# Patient Record
Sex: Female | Born: 1980 | Race: White | Hispanic: Yes | Marital: Single | Smoking: Former smoker
Health system: Southern US, Community
[De-identification: ages and names within clinical notes are randomized; demographics above are authoritative.]

## PROBLEM LIST (undated history)

## (undated) DIAGNOSIS — S92909A Unspecified fracture of unspecified foot, initial encounter for closed fracture: Secondary | ICD-10-CM

## (undated) DIAGNOSIS — F419 Anxiety disorder, unspecified: Secondary | ICD-10-CM

## (undated) DIAGNOSIS — B029 Zoster without complications: Secondary | ICD-10-CM

## (undated) DIAGNOSIS — IMO0002 Reserved for concepts with insufficient information to code with codable children: Secondary | ICD-10-CM

## (undated) DIAGNOSIS — IMO0001 Reserved for inherently not codable concepts without codable children: Secondary | ICD-10-CM

## (undated) DIAGNOSIS — N946 Dysmenorrhea, unspecified: Secondary | ICD-10-CM

## (undated) DIAGNOSIS — F1910 Other psychoactive substance abuse, uncomplicated: Secondary | ICD-10-CM

## (undated) DIAGNOSIS — S62609A Fracture of unspecified phalanx of unspecified finger, initial encounter for closed fracture: Secondary | ICD-10-CM

## (undated) DIAGNOSIS — N83209 Unspecified ovarian cyst, unspecified side: Secondary | ICD-10-CM

## (undated) DIAGNOSIS — S322XXA Fracture of coccyx, initial encounter for closed fracture: Secondary | ICD-10-CM

## (undated) DIAGNOSIS — K219 Gastro-esophageal reflux disease without esophagitis: Secondary | ICD-10-CM

## (undated) HISTORY — DX: Unspecified ovarian cyst, unspecified side: N83.209

## (undated) HISTORY — DX: Fracture of coccyx, initial encounter for closed fracture: S32.2XXA

## (undated) HISTORY — DX: Zoster without complications: B02.9

## (undated) HISTORY — DX: Unspecified fracture of unspecified foot, initial encounter for closed fracture: S92.909A

## (undated) HISTORY — DX: Dysmenorrhea, unspecified: N94.6

## (undated) HISTORY — DX: Reserved for concepts with insufficient information to code with codable children: IMO0002

## (undated) HISTORY — DX: Fracture of unspecified phalanx of unspecified finger, initial encounter for closed fracture: S62.609A

## (undated) HISTORY — DX: Anxiety disorder, unspecified: F41.9

## (undated) HISTORY — DX: Other psychoactive substance abuse, uncomplicated: F19.10

---
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
## 2003-05-30 ENCOUNTER — Encounter: Payer: Self-pay | Admitting: Emergency Medicine

## 2003-05-30 ENCOUNTER — Emergency Department (HOSPITAL_COMMUNITY): Admission: EM | Admit: 2003-05-30 | Discharge: 2003-05-30 | Payer: Self-pay | Admitting: Emergency Medicine

## 2003-07-11 ENCOUNTER — Other Ambulatory Visit: Admission: RE | Admit: 2003-07-11 | Discharge: 2003-07-11 | Payer: Self-pay | Admitting: Obstetrics and Gynecology

## 2004-01-05 ENCOUNTER — Other Ambulatory Visit: Admission: RE | Admit: 2004-01-05 | Discharge: 2004-01-05 | Payer: Self-pay | Admitting: Family Medicine

## 2004-04-30 ENCOUNTER — Emergency Department (HOSPITAL_COMMUNITY): Admission: EM | Admit: 2004-04-30 | Discharge: 2004-04-30 | Payer: Self-pay | Admitting: Emergency Medicine

## 2004-05-07 ENCOUNTER — Emergency Department (HOSPITAL_COMMUNITY): Admission: EM | Admit: 2004-05-07 | Discharge: 2004-05-07 | Payer: Self-pay | Admitting: Emergency Medicine

## 2004-05-23 ENCOUNTER — Emergency Department (HOSPITAL_COMMUNITY): Admission: EM | Admit: 2004-05-23 | Discharge: 2004-05-24 | Payer: Self-pay | Admitting: Emergency Medicine

## 2004-10-07 ENCOUNTER — Emergency Department (HOSPITAL_COMMUNITY): Admission: EM | Admit: 2004-10-07 | Discharge: 2004-10-07 | Payer: Self-pay | Admitting: Emergency Medicine

## 2005-03-11 ENCOUNTER — Other Ambulatory Visit: Admission: RE | Admit: 2005-03-11 | Discharge: 2005-03-11 | Payer: Self-pay | Admitting: Family Medicine

## 2005-09-06 ENCOUNTER — Encounter: Admission: RE | Admit: 2005-09-06 | Discharge: 2005-09-06 | Payer: Self-pay | Admitting: Family Medicine

## 2006-08-18 ENCOUNTER — Other Ambulatory Visit: Admission: RE | Admit: 2006-08-18 | Discharge: 2006-08-18 | Payer: Self-pay | Admitting: Family Medicine

## 2006-09-09 ENCOUNTER — Emergency Department (HOSPITAL_COMMUNITY): Admission: EM | Admit: 2006-09-09 | Discharge: 2006-09-09 | Payer: Self-pay | Admitting: Emergency Medicine

## 2006-10-27 ENCOUNTER — Emergency Department (HOSPITAL_COMMUNITY): Admission: EM | Admit: 2006-10-27 | Discharge: 2006-10-27 | Payer: Self-pay | Admitting: Emergency Medicine

## 2006-12-15 ENCOUNTER — Emergency Department (HOSPITAL_COMMUNITY): Admission: EM | Admit: 2006-12-15 | Discharge: 2006-12-15 | Payer: Self-pay | Admitting: Emergency Medicine

## 2007-02-06 ENCOUNTER — Emergency Department (HOSPITAL_COMMUNITY): Admission: EM | Admit: 2007-02-06 | Discharge: 2007-02-06 | Payer: Self-pay | Admitting: Emergency Medicine

## 2007-04-19 ENCOUNTER — Emergency Department (HOSPITAL_COMMUNITY): Admission: EM | Admit: 2007-04-19 | Discharge: 2007-04-19 | Payer: Self-pay | Admitting: Emergency Medicine

## 2007-06-29 ENCOUNTER — Emergency Department (HOSPITAL_COMMUNITY): Admission: EM | Admit: 2007-06-29 | Discharge: 2007-06-29 | Payer: Self-pay | Admitting: Emergency Medicine

## 2009-06-13 ENCOUNTER — Emergency Department (HOSPITAL_COMMUNITY): Admission: EM | Admit: 2009-06-13 | Discharge: 2009-06-13 | Payer: Self-pay | Admitting: Emergency Medicine

## 2009-07-08 ENCOUNTER — Emergency Department (HOSPITAL_COMMUNITY): Admission: EM | Admit: 2009-07-08 | Discharge: 2009-07-08 | Payer: Self-pay | Admitting: Emergency Medicine

## 2009-08-04 ENCOUNTER — Emergency Department (HOSPITAL_COMMUNITY): Admission: EM | Admit: 2009-08-04 | Discharge: 2009-08-04 | Payer: Self-pay | Admitting: Emergency Medicine

## 2009-10-02 ENCOUNTER — Emergency Department (HOSPITAL_COMMUNITY): Admission: EM | Admit: 2009-10-02 | Discharge: 2009-10-02 | Payer: Self-pay | Admitting: Emergency Medicine

## 2009-12-16 ENCOUNTER — Ambulatory Visit: Payer: Self-pay | Admitting: Diagnostic Radiology

## 2009-12-16 ENCOUNTER — Emergency Department (HOSPITAL_BASED_OUTPATIENT_CLINIC_OR_DEPARTMENT_OTHER): Admission: EM | Admit: 2009-12-16 | Discharge: 2009-12-16 | Payer: Self-pay | Admitting: Emergency Medicine

## 2009-12-29 ENCOUNTER — Emergency Department (HOSPITAL_BASED_OUTPATIENT_CLINIC_OR_DEPARTMENT_OTHER): Admission: EM | Admit: 2009-12-29 | Discharge: 2009-12-30 | Payer: Self-pay | Admitting: Emergency Medicine

## 2010-02-24 ENCOUNTER — Emergency Department (HOSPITAL_BASED_OUTPATIENT_CLINIC_OR_DEPARTMENT_OTHER): Admission: EM | Admit: 2010-02-24 | Discharge: 2010-02-25 | Payer: Self-pay | Admitting: Emergency Medicine

## 2010-02-25 ENCOUNTER — Ambulatory Visit: Payer: Self-pay | Admitting: Diagnostic Radiology

## 2010-03-27 ENCOUNTER — Ambulatory Visit (HOSPITAL_COMMUNITY): Admission: RE | Admit: 2010-03-27 | Discharge: 2010-03-27 | Payer: Self-pay | Admitting: Psychiatry

## 2010-04-02 ENCOUNTER — Ambulatory Visit: Payer: Self-pay | Admitting: Occupational Medicine

## 2010-04-02 DIAGNOSIS — S93409A Sprain of unspecified ligament of unspecified ankle, initial encounter: Secondary | ICD-10-CM | POA: Insufficient documentation

## 2010-04-24 ENCOUNTER — Emergency Department (HOSPITAL_COMMUNITY): Admission: EM | Admit: 2010-04-24 | Discharge: 2010-04-24 | Payer: Self-pay | Admitting: Emergency Medicine

## 2010-04-29 ENCOUNTER — Ambulatory Visit: Payer: Self-pay | Admitting: Diagnostic Radiology

## 2010-06-22 ENCOUNTER — Encounter: Admission: RE | Admit: 2010-06-22 | Discharge: 2010-06-22 | Payer: Self-pay | Admitting: Unknown Physician Specialty

## 2010-07-10 IMAGING — CR DG ANKLE COMPLETE 3+V*R*
3 series · 3 of 3 positions shown · non-contrast
Comparison: 02/25/2010.

CLINICAL DATA: Injury.

RIGHT ANKLE - COMPLETE 3+ VIEW

[view not recorded (1 of 3)]
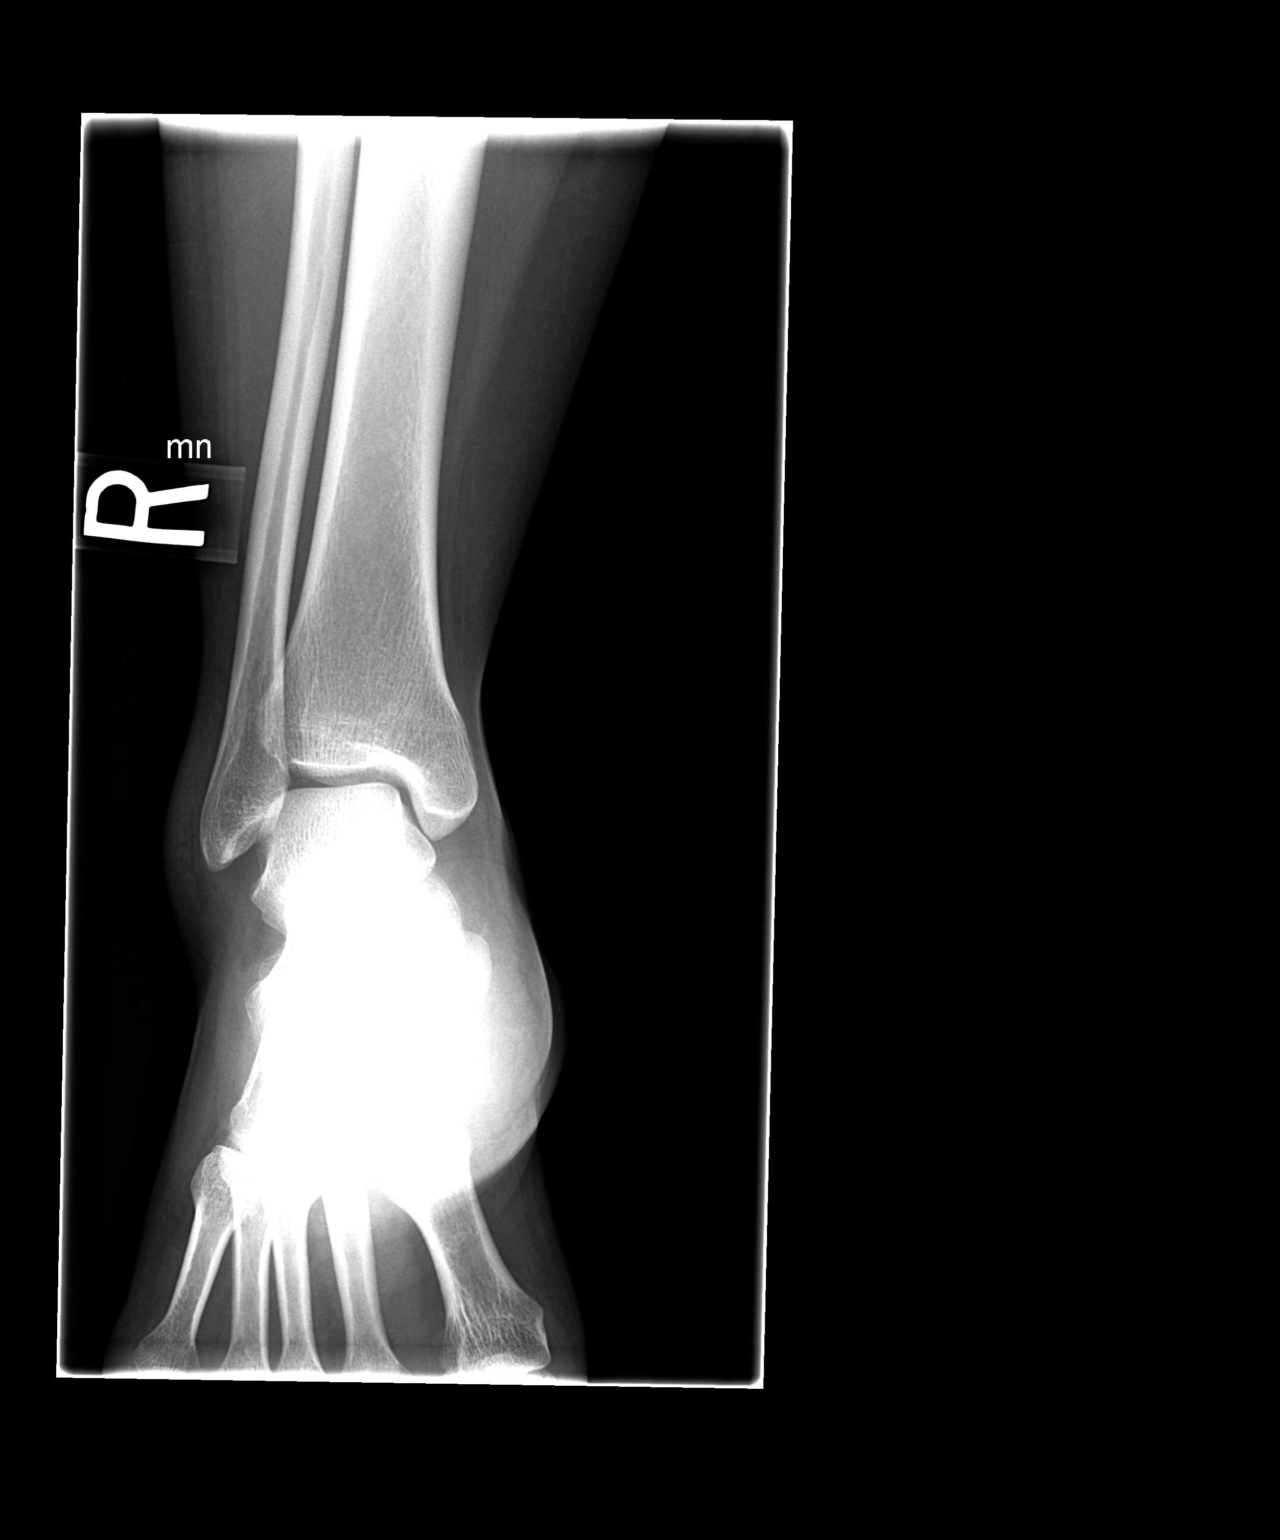

[view not recorded (2 of 3)]
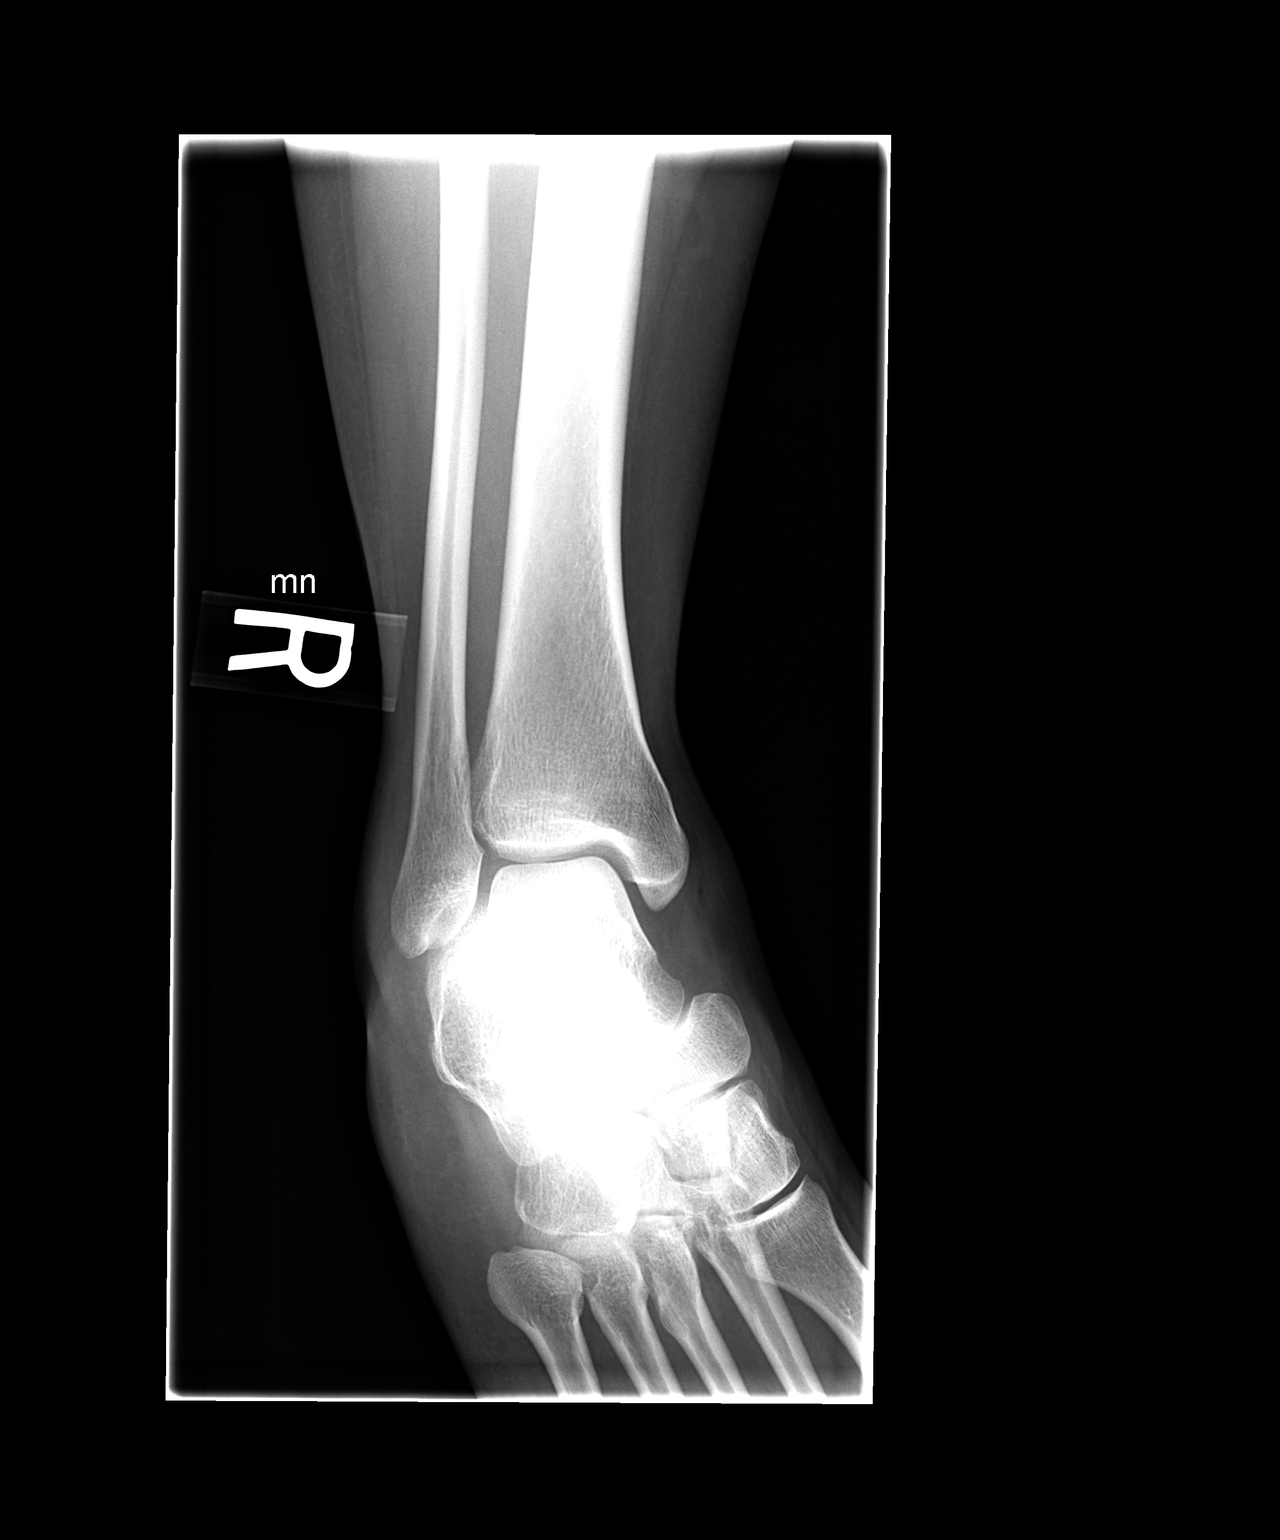

[view not recorded (3 of 3)]
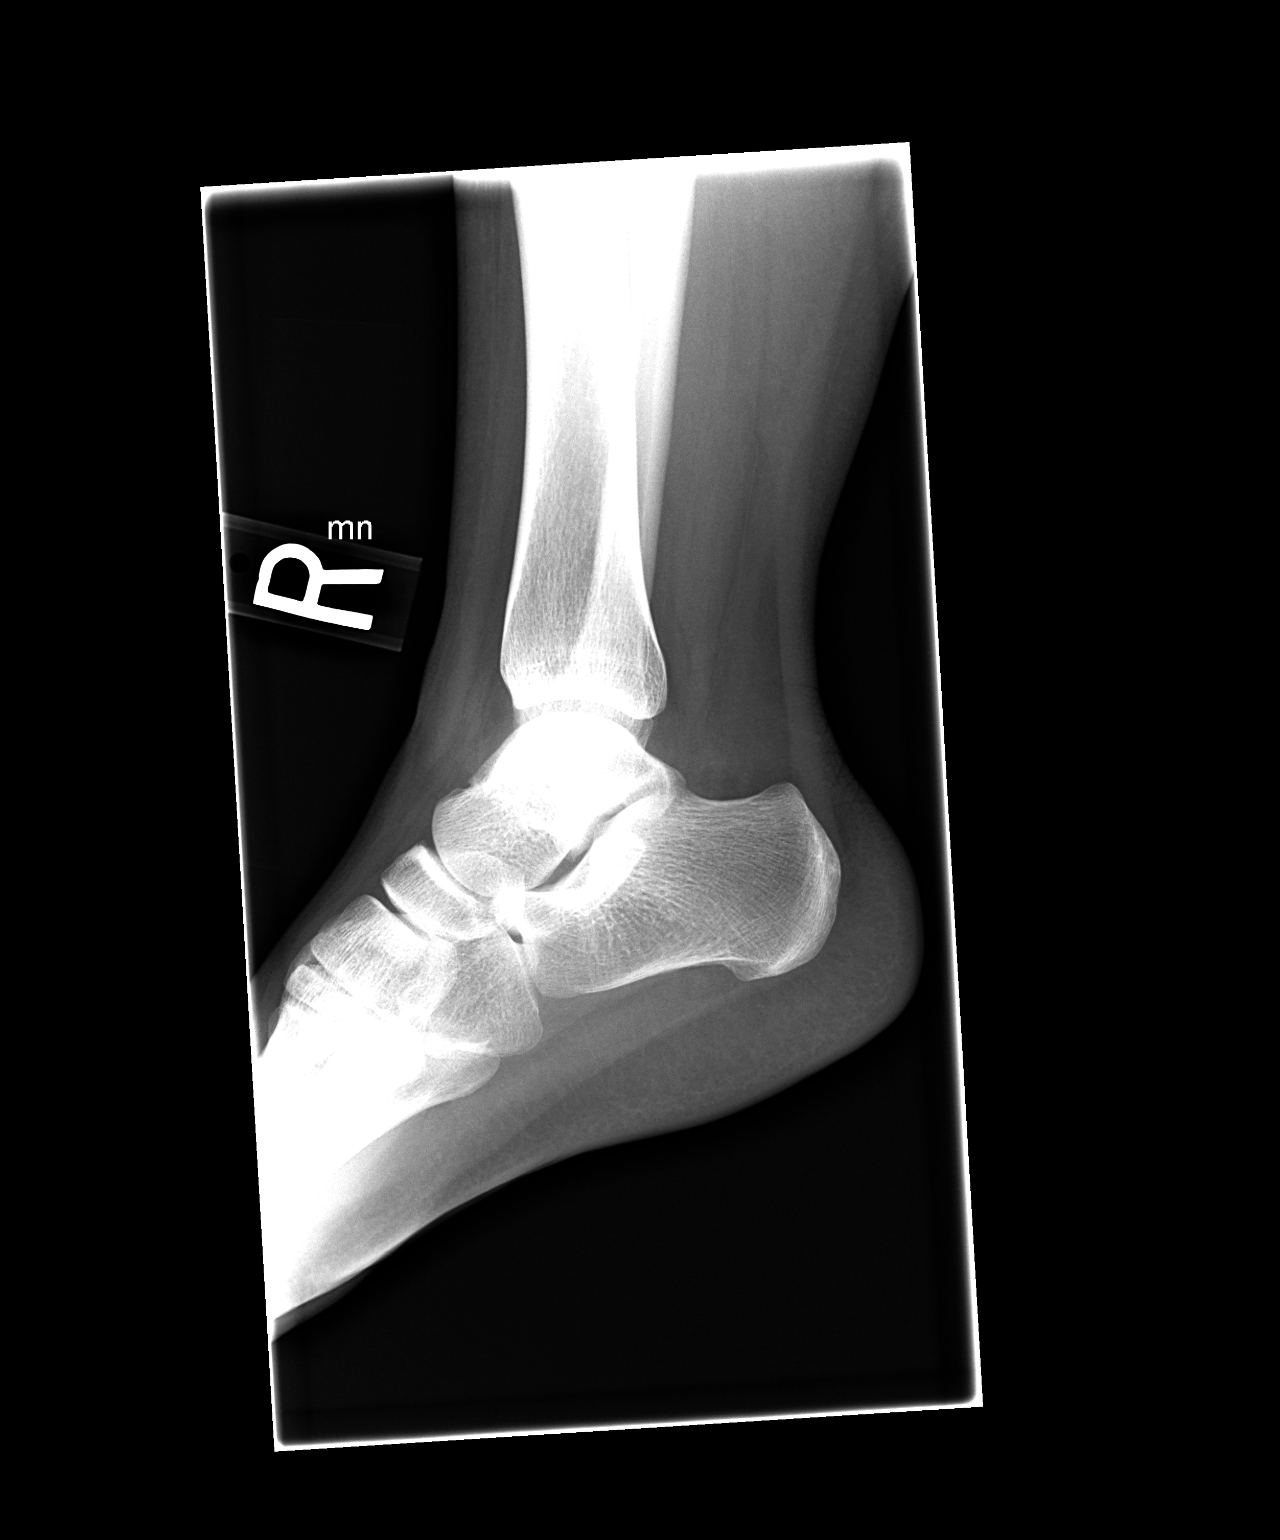

[3 of 3 positions shown; findings below may reference images not displayed]

FINDINGS: Soft tissue swelling mainly laterally.  No fracture or
subluxation.
IMPRESSION: Negative for right ankle fracture/subluxation.

## 2010-11-01 ENCOUNTER — Emergency Department (HOSPITAL_BASED_OUTPATIENT_CLINIC_OR_DEPARTMENT_OTHER): Admission: EM | Admit: 2010-11-01 | Discharge: 2010-04-29 | Payer: Self-pay | Admitting: Emergency Medicine

## 2010-11-11 ENCOUNTER — Ambulatory Visit: Payer: Self-pay | Admitting: Emergency Medicine

## 2010-11-11 DIAGNOSIS — M25539 Pain in unspecified wrist: Secondary | ICD-10-CM

## 2010-12-25 NOTE — Assessment & Plan Note (Signed)
Summary: INJURY TO ANKLE   Vital Signs:  Patient Profile:   30 Years Old Female CC:      Both ankles twisted while jogging x 5 days Height:     54 inches Weight:      130 pounds O2 Sat:      97 % O2 treatment:    Room Air Temp:     98.2 degrees F oral Pulse rate:   123 / minute Pulse rhythm:   irregular Resp:     18 per minute BP sitting:   113 / 73  (right arm) Cuff size:   regular  Pt. in pain?   yes    Location:   ankle    Intensity:   8    Type:       burning  Vitals Entered By: Emilio Math (Apr 02, 2010 4:04 PM)                 History of Present Illness Chief Complaint: Both ankles twisted while jogging x 5 days History of Present Illness: Very difficult historian.   Says she twisted both ankles while jogging some time in the past week.   She gave several dates as to the time of injury.  2 days ago, 1 week.   The patient smells of alcohol and has been unemployed for a year.   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  She did not drive herself to the clinic.  Her boyfriend drove.   She has been prescribed 70 hydrocodone in the last 4 months.   Last prescription for 30 pills was filled 01/25/2010  REVIEW OF SYSTEMS Constitutional Symptoms      Denies fever, chills, night sweats, weight loss, weight gain, and fatigue.  Eyes       Denies change in vision, eye pain, eye discharge, glasses, contact lenses, and eye surgery. Ear/Nose/Throat/Mouth       Denies hearing loss/aids, change in hearing, ear pain, ear discharge, dizziness, frequent runny nose, frequent nose bleeds, sinus problems, sore throat, hoarseness, and tooth pain or bleeding.  Respiratory       Denies dry cough, productive cough, wheezing, shortness of breath, asthma, bronchitis, and emphysema/COPD.  Cardiovascular       Denies murmurs, chest pain, and tires easily with exhertion.    Gastrointestinal       Denies stomach pain, nausea/vomiting, diarrhea, constipation,  blood in bowel movements, and indigestion. Genitourniary       Denies painful urination, kidney stones, and loss of urinary control. Neurological       Denies paralysis, seizures, and fainting/blackouts. Musculoskeletal       Denies muscle pain, joint pain, joint stiffness, decreased range of motion, redness, swelling, muscle weakness, and gout.  Skin       Denies bruising, unusual mles/lumps or sores, and hair/skin or nail changes.  Psych       Denies mood changes, temper/anger issues, anxiety/stress, speech problems, depression, and sleep problems.  Past History:  Past Medical History: Unremarkable  Past Surgical History: Denies surgical history  Family History: Mother, UNK Father, UNK  Social History: 2 cigs perday, 13 yrs ETOH-yes Drugs No Unemployed Physical Exam General appearance: smells of alcohol.   Fluid but tangental speech.  No slurring.  Head: normocephalic, atraumatic Eyes: conjunctivae and lids normal Ears: normal, no lesions or deformities Oral/Pharynx: tongue normal, posterior pharynx without erythema or exudate Extremities: Walks with a shuffling gait that improved as she  walks.   Minor swelling of the right lateral malloelus.  No bruising.   Patient expresses discomfort with light palpation of both lateral malloelus.  Assessment New Problems: ANKLE SPRAIN, RIGHT (ICD-845.00)   Plan New Medications/Changes: TRAMADOL HCL 50 MG TABS (TRAMADOL HCL) One or two tabs by mouth hs as needed for pain  #20 x 0, 04/02/2010, Kathrine Haddock MD  New Orders: T-DG Ankle Complete*L* [73610] T-DG Ankle Complete*R* [73610] New Patient Level III [99203] Planning Comments:     RIGHT ANKLE - COMPLETE 3+ VIEW   Comparison: 02/25/2010.   Findings: Soft tissue swelling mainly laterally.  No fracture or subluxation.   IMPRESSION: Negative for right ankle fracture/subluxation.  Ace wraps Tramadol for pain Follow up as needed   The patient and/or caregiver has  been counseled thoroughly with regard to medications prescribed including dosage, schedule, interactions, rationale for use, and possible side effects and they verbalize understanding.  Diagnoses and expected course of recovery discussed and will return if not improved as expected or if the condition worsens. Patient and/or caregiver verbalized understanding.  Prescriptions: TRAMADOL HCL 50 MG TABS (TRAMADOL HCL) One or two tabs by mouth hs as needed for pain  #20 x 0   Entered and Authorized by:   Kathrine Haddock MD   Signed by:   Kathrine Haddock MD on 04/02/2010   Method used:   Print then Give to Patient   RxID:   [id number]

## 2010-12-27 NOTE — Assessment & Plan Note (Signed)
Summary: DOG BITE/TM   Vital Signs:  Patient Profile:   30 Years Old Female CC:      Dog bite to rt hand Height:     54 inches Weight:      130 pounds O2 Sat:      97 % O2 treatment:    Room Air Temp:     99.0 degrees F oral Pulse rate:   118 / minute Pulse rhythm:   regular Resp:     18 per minute BP sitting:   107 / 71  (left arm) Cuff size:   regular  Vitals Entered By: Emilio Math (November 11, 2010 6:03 PM)                  Current Allergies: No known allergies History of Present Illness History from: patient & boyfriend Chief Complaint: Dog bite to rt hand History of Present Illness: She was watching a football game at a friend's house and playing with their 72 month old pit bull and states that she was nipped on the right hand and wrist multiple times.  They report that the dog was being playful and has done this many times in the past.  Mild scratches but it didn't break the skin.  Pain is throbbing and radiates to fingers and all the way up her arm.  She denies falling or other trauma.  Her last tetanus shot was 1 year ago.  The dog is up to date on his rabies shot.  **Patient smells of alcohol and is a difficult historian.  She was brought to the clinic by her boyfriend who is apparently sober.  REVIEW OF SYSTEMS Constitutional Symptoms      Denies fever, chills, night sweats, weight loss, weight gain, and fatigue.  Eyes       Denies change in vision, eye pain, eye discharge, glasses, contact lenses, and eye surgery. Ear/Nose/Throat/Mouth       Denies hearing loss/aids, change in hearing, ear pain, ear discharge, dizziness, frequent runny nose, frequent nose bleeds, sinus problems, sore throat, hoarseness, and tooth pain or bleeding.  Respiratory       Denies dry cough, productive cough, wheezing, shortness of breath, asthma, bronchitis, and emphysema/COPD.  Cardiovascular       Denies murmurs, chest pain, and tires easily with exhertion.    Gastrointestinal       Denies stomach pain, nausea/vomiting, diarrhea, constipation, blood in bowel movements, and indigestion. Genitourniary       Denies painful urination, kidney stones, and loss of urinary control. Neurological       Denies paralysis, seizures, and fainting/blackouts. Musculoskeletal       Denies muscle pain, joint pain, joint stiffness, decreased range of motion, redness, swelling, muscle weakness, and gout.  Skin       Complains of bruising.      Denies unusual mles/lumps or sores and hair/skin or nail changes.  Psych       Denies mood changes, temper/anger issues, anxiety/stress, speech problems, depression, and sleep problems.  Past History:  Past Medical History: Reviewed history from 04/02/2010 and no changes required. Unremarkable  Past Surgical History: Reviewed history from 04/02/2010 and no changes required. Denies surgical history  Family History: Reviewed history from 04/02/2010 and no changes required. Mother, Loreli Slot Father, UNK  Social History: Reviewed history from 04/02/2010 and no changes required. 2 cigs perday, 13 yrs ETOH-yes Drugs No Unemployed Physical Exam General appearance: well developed, well nourished, talkative and tangential, smells strongly of alcohol Head:  normocephalic, atraumatic R hand: patient holding the whole arm still and is resistant to examination.  She is tender to palpation throughout the entire hand and wrist and forearm and elbow with all movements.  I cannot elicit a specific spot of tenderness but if I had to narrow it down, the ulnar styloid may be the worst spot.  Very mild superficial scratches on hand.  No lacerations or puncture wounds.  No signs of infection.  No foreign bodies.  No swelling or ecchymoses noted.  Distal NV status is intact. Assessment New Problems: WRIST PAIN, RIGHT (ICD-719.43) DOG BITE (ICD-E906.0)  Hand & wrist contusion  Patient Education: Patient and/or caregiver instructed in the following: rest,  fluids.  Plan New Medications/Changes: ULTRACET 37.5-325 MG TABS (TRAMADOL-ACETAMINOPHEN) 1 by mouth q6-8 hrs as needed for pain  #15 x 0, 11/11/2010, Hoyt Koch MD DOXYCYCLINE HYCLATE 100 MG CAPS (DOXYCYCLINE HYCLATE) 1 by mouth two times a day for 1 week  #14 x 0, 11/11/2010, Hoyt Koch MD  New Orders: Est. Patient Level II [16109] Wrist-hand Ext Cntrl Cock-up N/Mld [U0454] Planning Comments:   Advised to ice frequently over the next 1-2 days.  Also gave Rx for Ultracet for pain.  Because the patient has no insurance it was decided by the patient to hold off on an Xray (due to financial reasons) for now to see how it feels tomorrow once the initial throbbing has worn off.  I don't feel that I can trust her current judgement or stated history, but from the stated injury, it would indeed be possible that a fracture could be present, but is unlikely.  However, I cannot narrow down the injury to a specific spot.  If she returns, I have informed her that we will not charge another copay but she will have to pay for the Xray at that time.  Her boyfriend appears sober and understands and agrees to this course of action.  If pain quickly subsides in 1-2 days, likely will not need any follow up.  Watch the dog over the next few days for acting strangely.  If any signs of infection or lack of improvement, seek medical care with PCP or orthopedics.   The patient and/or caregiver has been counseled thoroughly with regard to medications prescribed including dosage, schedule, interactions, rationale for use, and possible side effects and they verbalize understanding.  Diagnoses and expected course of recovery discussed and will return if not improved as expected or if the condition worsens. Patient and/or caregiver verbalized understanding.   PROCEDURE: Procedure: Area cleansed with Hibaclens but no dressing is required.  She was placed in a wrist splint per her request. Prescriptions: ULTRACET  37.5-325 MG TABS (TRAMADOL-ACETAMINOPHEN) 1 by mouth q6-8 hrs as needed for pain  #15 x 0   Entered and Authorized by:   Hoyt Koch MD   Signed by:   Hoyt Koch MD on 11/11/2010   Method used:   Print then Give to Patient   RxID:   [id number] DOXYCYCLINE HYCLATE 100 MG CAPS (DOXYCYCLINE HYCLATE) 1 by mouth two times a day for 1 week  #14 x 0   Entered and Authorized by:   Hoyt Koch MD   Signed by:   Hoyt Koch MD on 11/11/2010   Method used:   Print then Give to Patient   RxID:   [id number]   Orders Added: 1)  Est. Patient Level II [41324] 2)  Wrist-hand Ext Cntrl Cock-up N/Mld [M0102]

## 2011-02-14 LAB — COMPREHENSIVE METABOLIC PANEL
ALT: 8 U/L (ref 0–35)
Albumin: 5 g/dL (ref 3.5–5.2)
Alkaline Phosphatase: 63 U/L (ref 39–117)
BUN: 4 mg/dL — ABNORMAL LOW (ref 6–23)
Chloride: 111 mEq/L (ref 96–112)
Glucose, Bld: 92 mg/dL (ref 70–99)
Potassium: 4 mEq/L (ref 3.5–5.1)
Sodium: 148 mEq/L — ABNORMAL HIGH (ref 135–145)
Total Bilirubin: 0.3 mg/dL (ref 0.3–1.2)
Total Protein: 8.1 g/dL (ref 6.0–8.3)

## 2011-02-14 LAB — GC/CHLAMYDIA PROBE AMP, GENITAL
Chlamydia, DNA Probe: NEGATIVE
GC Probe Amp, Genital: NEGATIVE

## 2011-02-14 LAB — DIFFERENTIAL
Basophils Absolute: 0.1 10*3/uL (ref 0.0–0.1)
Basophils Relative: 2 % — ABNORMAL HIGH (ref 0–1)
Eosinophils Absolute: 0.1 10*3/uL (ref 0.0–0.7)
Monocytes Absolute: 0.4 10*3/uL (ref 0.1–1.0)
Monocytes Relative: 6 % (ref 3–12)
Neutro Abs: 3.4 10*3/uL (ref 1.7–7.7)

## 2011-02-14 LAB — URINALYSIS, ROUTINE W REFLEX MICROSCOPIC
Bilirubin Urine: NEGATIVE
Hgb urine dipstick: NEGATIVE
Specific Gravity, Urine: 1.003 — ABNORMAL LOW (ref 1.005–1.030)
pH: 6 (ref 5.0–8.0)

## 2011-02-14 LAB — CBC
HCT: 42.6 % (ref 36.0–46.0)
Hemoglobin: 14.8 g/dL (ref 12.0–15.0)
Platelets: 303 10*3/uL (ref 150–400)
RDW: 12.2 % (ref 11.5–15.5)
WBC: 5.7 10*3/uL (ref 4.0–10.5)

## 2011-02-14 LAB — RPR: RPR Ser Ql: NONREACTIVE

## 2011-02-27 LAB — POCT PREGNANCY, URINE: Preg Test, Ur: POSITIVE

## 2011-03-01 LAB — CBC
HCT: 41.5 % (ref 36.0–46.0)
MCV: 102.3 fL — ABNORMAL HIGH (ref 78.0–100.0)
Platelets: 273 10*3/uL (ref 150–400)
RDW: 13 % (ref 11.5–15.5)

## 2011-03-01 LAB — DIFFERENTIAL
Lymphocytes Relative: 31 % (ref 12–46)
Lymphs Abs: 1.7 10*3/uL (ref 0.7–4.0)
Monocytes Absolute: 0.4 10*3/uL (ref 0.1–1.0)
Monocytes Relative: 8 % (ref 3–12)
Neutro Abs: 3.3 10*3/uL (ref 1.7–7.7)

## 2011-03-01 LAB — ETHANOL: Alcohol, Ethyl (B): 332 mg/dL — ABNORMAL HIGH (ref 0–10)

## 2011-03-01 LAB — COMPREHENSIVE METABOLIC PANEL
Albumin: 4.3 g/dL (ref 3.5–5.2)
BUN: 3 mg/dL — ABNORMAL LOW (ref 6–23)
Creatinine, Ser: 0.58 mg/dL (ref 0.4–1.2)
Total Protein: 7.4 g/dL (ref 6.0–8.3)

## 2011-03-01 LAB — URINALYSIS, ROUTINE W REFLEX MICROSCOPIC
Bilirubin Urine: NEGATIVE
Ketones, ur: NEGATIVE mg/dL
Nitrite: NEGATIVE
Specific Gravity, Urine: 1.005 (ref 1.005–1.030)
Urobilinogen, UA: 0.2 mg/dL (ref 0.0–1.0)

## 2011-03-01 LAB — WET PREP, GENITAL: Trich, Wet Prep: NONE SEEN

## 2011-03-01 LAB — GC/CHLAMYDIA PROBE AMP, GENITAL
Chlamydia, DNA Probe: NEGATIVE
GC Probe Amp, Genital: NEGATIVE

## 2011-03-03 LAB — RAPID URINE DRUG SCREEN, HOSP PERFORMED
Amphetamines: NOT DETECTED
Opiates: NOT DETECTED
Tetrahydrocannabinol: NOT DETECTED

## 2011-03-03 LAB — URINALYSIS, ROUTINE W REFLEX MICROSCOPIC
Bilirubin Urine: NEGATIVE
Hgb urine dipstick: NEGATIVE
Specific Gravity, Urine: 1.002 — ABNORMAL LOW (ref 1.005–1.030)
Urobilinogen, UA: 0.2 mg/dL (ref 0.0–1.0)

## 2011-09-09 LAB — URINALYSIS, ROUTINE W REFLEX MICROSCOPIC
Bilirubin Urine: NEGATIVE
Hgb urine dipstick: NEGATIVE
Ketones, ur: NEGATIVE
Protein, ur: NEGATIVE
Urobilinogen, UA: 0.2

## 2011-09-09 LAB — RAPID URINE DRUG SCREEN, HOSP PERFORMED
Amphetamines: NOT DETECTED
Barbiturates: NOT DETECTED
Tetrahydrocannabinol: NOT DETECTED

## 2011-09-09 LAB — DIFFERENTIAL
Eosinophils Relative: 1
Lymphocytes Relative: 24
Lymphs Abs: 1.4
Monocytes Absolute: 0.4
Neutro Abs: 3.8

## 2011-09-09 LAB — ETHANOL: Alcohol, Ethyl (B): 224 — ABNORMAL HIGH

## 2011-09-09 LAB — HCG, QUANTITATIVE, PREGNANCY: hCG, Beta Chain, Quant, S: 93988 — ABNORMAL HIGH

## 2011-09-09 LAB — BASIC METABOLIC PANEL
GFR calc Af Amer: >60
GFR calc non Af Amer: >60
Potassium: 3.6
Sodium: 138

## 2011-09-09 LAB — CBC
HCT: 38
Hemoglobin: 13.3
WBC: 5.7

## 2011-09-09 LAB — ACETAMINOPHEN LEVEL: Acetaminophen (Tylenol), Serum: <10 — ABNORMAL LOW

## 2011-09-09 LAB — HEPATIC FUNCTION PANEL
Albumin: 3.7
Total Protein: 6.8

## 2012-06-26 ENCOUNTER — Emergency Department (INDEPENDENT_AMBULATORY_CARE_PROVIDER_SITE_OTHER): Payer: Self-pay

## 2012-06-26 ENCOUNTER — Encounter: Payer: Self-pay | Admitting: *Deleted

## 2012-06-26 ENCOUNTER — Emergency Department
Admission: EM | Admit: 2012-06-26 | Discharge: 2012-06-26 | Disposition: A | Discharge status: Home / Self Care | Payer: Self-pay | Source: Home / Self Care | Attending: Family Medicine | Admitting: Family Medicine

## 2012-06-26 DIAGNOSIS — S60229A Contusion of unspecified hand, initial encounter: Secondary | ICD-10-CM

## 2012-06-26 DIAGNOSIS — S60222A Contusion of left hand, initial encounter: Secondary | ICD-10-CM

## 2012-06-26 DIAGNOSIS — M25539 Pain in unspecified wrist: Secondary | ICD-10-CM

## 2012-06-26 DIAGNOSIS — W19XXXA Unspecified fall, initial encounter: Secondary | ICD-10-CM

## 2012-06-26 DIAGNOSIS — S63509A Unspecified sprain of unspecified wrist, initial encounter: Secondary | ICD-10-CM

## 2012-06-26 DIAGNOSIS — B029 Zoster without complications: Secondary | ICD-10-CM

## 2012-06-26 DIAGNOSIS — S66819A Strain of other specified muscles, fascia and tendons at wrist and hand level, unspecified hand, initial encounter: Secondary | ICD-10-CM

## 2012-06-26 DIAGNOSIS — S66919A Strain of unspecified muscle, fascia and tendon at wrist and hand level, unspecified hand, initial encounter: Secondary | ICD-10-CM

## 2012-06-26 DIAGNOSIS — M79609 Pain in unspecified limb: Secondary | ICD-10-CM

## 2012-06-26 MED ORDER — VALACYCLOVIR HCL 1 G PO TABS: 1000.0000 mg | ORAL_TABLET | Freq: Three times a day (TID) | ORAL | Status: DC

## 2012-06-26 MED ORDER — KETOROLAC TROMETHAMINE 60 MG/2ML IM SOLN
60.0000 mg | Freq: Once | INTRAMUSCULAR | Status: AC
  Administered 2012-06-26: 60 mg via INTRAMUSCULAR

## 2012-06-26 MED ORDER — HYDROCODONE-ACETAMINOPHEN 5-300 MG PO TABS: ORAL_TABLET | ORAL | Status: DC

## 2012-06-26 NOTE — ED Notes (Signed)
Patient reports she shut her left wrist in a door 5 days ago. Still c/o left wrist pain. Swelling present, she has used ice. Reports 10/10 pain. SHe also has a rash with small red/white bumps on her left FA she reports a severe burning. She has a hx of chicken pox.

## 2012-06-26 NOTE — ED Provider Notes (Signed)
History     CSN: [id number]  Arrival date & time 06/26/12  1537   First MD Initiated Contact with Patient 06/26/12 1605      Chief Complaint  Patient presents with  . Rash    left FA  . Wrist Pain    left     HPI Comments: Patient reports she shut her left wrist in a door 5 days ago. Still c/o left wrist pain. Swelling present, she has used ice. Reports 10/10 pain. SHe also has a rash with small red/white bumps on her left FA she reports a severe burning. She has a hx of chicken pox.   Patient is a 31 y.o. female presenting with wrist pain. The history is provided by the patient.  Wrist Pain This is a new problem. Episode onset: 5 days ago. The problem occurs constantly. The problem has been gradually worsening. Associated symptoms comments: Rash on left upper forearm. Exacerbated by: movement of left wrist and hand. Nothing relieves the symptoms. Treatments tried: ice pack and ace wrap. The treatment provided mild relief.    History reviewed. No pertinent past medical history.  History reviewed. No pertinent past surgical history.  History reviewed. No pertinent family history.  History  Substance Use Topics  . Smoking status: Never Smoker   . Smokeless tobacco: Not on file  . Alcohol Use: No    OB History    Grav Para Term Preterm Abortions TAB SAB Ect Mult Living                  Review of Systems  All other systems reviewed and are negative.    Allergies  Review of patient's allergies indicates no known allergies.  Home Medications   Current Outpatient Rx  Name Route Sig Dispense Refill  . HYDROCODONE-ACETAMINOPHEN 5-300 MG PO TABS  Take one tab by mouth once or twice daily as needed for pain 15 each 0  . VALACYCLOVIR HCL 1 G PO TABS Oral Take 1 tablet (1,000 mg total) by mouth 3 (three) times daily. 21 tablet 0    BP 100/67  Pulse 103  Resp 16  Ht 4\' 10"  (1.473 m)  Wt 129 lb (58.514 kg)  BMI 26.96 kg/m2  SpO2 98%  Physical Exam  Constitutional:  She is oriented to person, place, and time. She appears well-developed and well-nourished. No distress.  Eyes: Conjunctivae are normal. Pupils are equal, round, and reactive to light.  Musculoskeletal: She exhibits tenderness.       Right wrist: She exhibits decreased range of motion, tenderness and bony tenderness. She exhibits no swelling, no effusion, no crepitus, no deformity and no laceration.       Arms:      Left hand: She exhibits decreased range of motion, tenderness and bony tenderness. She exhibits normal two-point discrimination, normal capillary refill, no deformity, no laceration and no swelling. decreased sensation noted. Decreased strength noted. She exhibits finger abduction and wrist extension trouble.       Hands:      Tenderness left dorsal hand as noted.   Tenderness left dorsal wrist as noted Tenderness left forearm as noted  Neurological: She is alert and oriented to person, place, and time.  Skin: Skin is warm and dry. Rash noted.          Herpetiform eruption present as noted on diagram    ED Course  Procedures  None  Labs Reviewed - No data to display Dg Forearm Left  06/26/2012  *RADIOLOGY  REPORT*  Clinical Data: History of injury and pain.  LEFT FOREARM - 2 VIEW  Comparison: Wrist examination of same date.  Findings: Alignment is normal.  Joint spaces are preserved.  No fracture or dislocation is evident.  No soft tissue lesions are seen.  IMPRESSION: No fracture or dislocation is evident.  Original Report Authenticated By: Crawford Givens, M.D.   Dg Wrist Complete Left  06/26/2012  *RADIOLOGY REPORT*  Clinical Data: History of injury and pain.  LEFT WRIST - COMPLETE 3+ VIEW  Comparison: Hand examination of same date.  Findings: Alignment is normal.  Joint spaces are preserved.  No fracture or dislocation is evident.  No soft tissue lesions are seen.  IMPRESSION: No fracture or dislocation is evident.  Original Report Authenticated By: Crawford Givens, M.D.   Dg Hand 2 View  Left  06/26/2012  *RADIOLOGY REPORT*  Clinical Data: Blunt trauma to the hand.  Tenderness over the dorsal surface of the metacarpals.  Hand pain.  Fall.  LEFT HAND - 2 VIEW  Comparison: Left wrist films 12/16/2009.  Findings: Mild soft tissue swelling is present over the dorsum hand.  There are no underlying fractures.  No radiopaque foreign body is present.  IMPRESSION:  1.  Soft tissue contusion of the dorsum hand without an underlying fracture or radiopaque foreign body.  Original Report Authenticated By: Jamesetta Orleans. MATTERN, M.D.     1. Wrist sprain and strain   2. Contusion of hand, left   3. Herpes zoster left arm      MDM  Toradol 60mg  IM Wrist splint applied.  Vicodin for pain. Wear splint for about 5 to 7 days.  Continue applying ice pack several times daily.  Take Ibuprofen 200mg , 4 tabs every 8 hours with food.  Begin range of motion and stretching exercises in about five days ([facility] information and instruction handout given)  Followup with [facility] if not improving about two weeks.         Lattie Haw, MD 06/26/12 [phone]

## 2012-07-28 ENCOUNTER — Encounter: Payer: Self-pay | Admitting: *Deleted

## 2012-07-28 ENCOUNTER — Emergency Department
Admission: EM | Admit: 2012-07-28 | Discharge: 2012-07-28 | Disposition: A | Discharge status: Home / Self Care | Payer: Self-pay | Source: Home / Self Care | Attending: Family Medicine | Admitting: Family Medicine

## 2012-07-28 DIAGNOSIS — M79609 Pain in unspecified limb: Secondary | ICD-10-CM

## 2012-07-28 DIAGNOSIS — M79601 Pain in right arm: Secondary | ICD-10-CM

## 2012-07-28 DIAGNOSIS — B029 Zoster without complications: Secondary | ICD-10-CM

## 2012-07-28 MED ORDER — VALACYCLOVIR HCL 1 G PO TABS: 1000.0000 mg | ORAL_TABLET | Freq: Three times a day (TID) | ORAL | Status: DC

## 2012-07-28 MED ORDER — PREDNISONE 20 MG PO TABS: 20.0000 mg | ORAL_TABLET | Freq: Two times a day (BID) | ORAL | Status: AC

## 2012-07-28 MED ORDER — TRAMADOL HCL 50 MG PO TABS: 50.0000 mg | ORAL_TABLET | Freq: Every evening | ORAL | Status: AC | PRN

## 2012-07-28 NOTE — ED Provider Notes (Signed)
History     CSN: [id number]  Arrival date & time 07/28/12  1948   First MD Initiated Contact with Patient 07/28/12 2015      Chief Complaint  Patient presents with  . Rash      HPI Comments: Patient reports that her previous rash on left arm improved while taking Valtrex, but did not fully resolve until she was re-evaluated and started on a second course of Valtrex and prednisone.  Two days ago she developed itchin in her right forearm, radiating to her right hand/wrist.  The area has now developed a painful burning sensation and she has developed a faint erythematous rash on the right arm, similar to the previous rash on her left arm.  She feels well otherwise.  She states that she has been under increased stress recently.  Patient is a 31 y.o. female presenting with rash. The history is provided by the patient.  Rash  This is a recurrent problem. Episode onset: 3 days ago. The problem has been gradually worsening. The problem is associated with an unknown factor. There has been no fever. The rash is present on the right arm, right hand and right wrist. The pain is at a severity of 7/10. The pain is moderate. The pain has been constant since onset. Associated symptoms include itching and pain. Pertinent negatives include no blisters and no weeping. She has tried nothing for the symptoms.    History reviewed. No pertinent past medical history.  History reviewed. No pertinent past surgical history.  History reviewed. No pertinent family history.  History  Substance Use Topics  . Smoking status: Never Smoker   . Smokeless tobacco: Not on file  . Alcohol Use: No    OB History    Grav Para Term Preterm Abortions TAB SAB Ect Mult Living                  Review of Systems  Skin: Positive for itching and rash.  All other systems reviewed and are negative.    Allergies  Review of patient's allergies indicates no known allergies.  Home Medications   Current Outpatient Rx    Name Route Sig Dispense Refill  . HYDROCODONE-ACETAMINOPHEN 5-300 MG PO TABS  Take one tab by mouth once or twice daily as needed for pain 15 each 0  . PREDNISONE 20 MG PO TABS Oral Take 1 tablet (20 mg total) by mouth 2 (two) times daily. Take with food. 10 tablet 0  . TRAMADOL HCL 50 MG PO TABS Oral Take 1 tablet (50 mg total) by mouth at bedtime as needed for pain. 10 tablet 0  . VALACYCLOVIR HCL 1 G PO TABS Oral Take 1 tablet (1,000 mg total) by mouth 3 (three) times daily. 21 tablet 0  . VALACYCLOVIR HCL 1 G PO TABS Oral Take 1 tablet (1,000 mg total) by mouth 3 (three) times daily. 21 tablet 0    BP 112/82  Pulse 121  Temp 98.4 F (36.9 C) (Oral)  Resp 16  Ht 4' 9.5" (1.461 m)  Wt 134 lb (60.782 kg)  BMI 28.50 kg/m2  SpO2 98%  LMP 07/21/2012  Physical Exam  Constitutional: She is oriented to person, place, and time. She appears well-developed and well-nourished. No distress.  HENT:  Nose: Nose normal.  Mouth/Throat: Oropharynx is clear and moist.  Eyes: Conjunctivae are normal. Pupils are equal, round, and reactive to light.  Neck: Neck supple.  Cardiovascular: Normal rate and normal heart sounds.   Pulmonary/Chest:  Breath sounds normal.  Musculoskeletal: Normal range of motion.  Lymphadenopathy:    She has no cervical adenopathy.  Neurological: She is alert and oriented to person, place, and time.  Skin: Skin is dry. Rash noted.          Faint erythematous eruption on right forearm in distribution as noted.  No swelling or warmth.  Distal Neurovascular function is intact.     ED Course  Procedures none      1. Herpes zoster   2. Arm pain, right       MDM  Begin Valtrex; prednisone burst.  Tramadol at bedtime prn. Followup dermatologist if not improved one week. Recommend complete annual physical exam by PCP       Lattie Haw, MD 07/29/12 [phone]

## 2012-07-28 NOTE — ED Notes (Addendum)
Pt c/o burning rash on her RT arm x 2 days. She reports that she  had shingle 1 month ago. No OTC meds.

## 2012-08-19 ENCOUNTER — Encounter (HOSPITAL_COMMUNITY): Payer: Self-pay

## 2012-08-19 ENCOUNTER — Telehealth (HOSPITAL_COMMUNITY): Payer: Self-pay

## 2012-08-19 ENCOUNTER — Emergency Department (HOSPITAL_COMMUNITY)
Admission: EM | Admit: 2012-08-19 | Discharge: 2012-08-19 | Disposition: A | Discharge status: Home / Self Care | Payer: Self-pay | Attending: Emergency Medicine | Admitting: Emergency Medicine

## 2012-08-19 ENCOUNTER — Emergency Department (HOSPITAL_COMMUNITY): Payer: Self-pay

## 2012-08-19 ENCOUNTER — Ambulatory Visit (HOSPITAL_COMMUNITY)
Admission: RE | Admit: 2012-08-19 | Discharge: 2012-08-19 | Disposition: A | Discharge status: Home / Self Care | Payer: Self-pay | Attending: Psychiatry | Admitting: Psychiatry

## 2012-08-19 DIAGNOSIS — M79609 Pain in unspecified limb: Secondary | ICD-10-CM | POA: Insufficient documentation

## 2012-08-19 DIAGNOSIS — S62609A Fracture of unspecified phalanx of unspecified finger, initial encounter for closed fracture: Secondary | ICD-10-CM

## 2012-08-19 DIAGNOSIS — F329 Major depressive disorder, single episode, unspecified: Secondary | ICD-10-CM | POA: Insufficient documentation

## 2012-08-19 DIAGNOSIS — F17200 Nicotine dependence, unspecified, uncomplicated: Secondary | ICD-10-CM | POA: Insufficient documentation

## 2012-08-19 DIAGNOSIS — F332 Major depressive disorder, recurrent severe without psychotic features: Secondary | ICD-10-CM | POA: Insufficient documentation

## 2012-08-19 DIAGNOSIS — B029 Zoster without complications: Secondary | ICD-10-CM

## 2012-08-19 DIAGNOSIS — M79642 Pain in left hand: Secondary | ICD-10-CM

## 2012-08-19 DIAGNOSIS — F3289 Other specified depressive episodes: Secondary | ICD-10-CM | POA: Insufficient documentation

## 2012-08-19 HISTORY — DX: Fracture of unspecified phalanx of unspecified finger, initial encounter for closed fracture: S62.609A

## 2012-08-19 HISTORY — DX: Zoster without complications: B02.9

## 2012-08-19 LAB — BASIC METABOLIC PANEL
Chloride: 99 mEq/L (ref 96–112)
Creatinine, Ser: 0.51 mg/dL (ref 0.50–1.10)
GFR calc Af Amer: >90 mL/min (ref >90)
GFR calc non Af Amer: >90 mL/min (ref >90)

## 2012-08-19 LAB — RAPID URINE DRUG SCREEN, HOSP PERFORMED: Benzodiazepines: NOT DETECTED

## 2012-08-19 LAB — CBC WITH DIFFERENTIAL/PLATELET
Basophils Absolute: 0 10*3/uL (ref 0.0–0.1)
Basophils Relative: 1 % (ref 0–1)
HCT: 39.2 % (ref 36.0–46.0)
MCHC: 35.5 g/dL (ref 30.0–36.0)
Monocytes Absolute: 0.3 10*3/uL (ref 0.1–1.0)
Neutro Abs: 2.9 10*3/uL (ref 1.7–7.7)
Neutrophils Relative %: 69 % (ref 43–77)
RDW: 13.5 % (ref 11.5–15.5)

## 2012-08-19 LAB — ETHANOL: Alcohol, Ethyl (B): 227 mg/dL — ABNORMAL HIGH (ref 0–11)

## 2012-08-19 NOTE — ED Notes (Signed)
[id number]<AV> Expected date:<BR> Expected time:<BR> Means of arrival:<BR> Comments:<BR> From [facility]-possible shingles
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
## 2012-08-19 NOTE — ED Provider Notes (Addendum)
History     CSN: [id number]  Arrival date & time 08/19/12  Ernestina Columbia   First MD Initiated Contact with Patient 08/19/12 1941      Chief Complaint  Patient presents with  . Medical Clearance    (Consider location/radiation/quality/duration/timing/severity/associated sxs/prior treatment) HPI.... initially patient was evaluated at behavioral health for depression. Sent to [facility] for further evaluation. She states to me no suicidal or homicidal ideation.  Has a regular therapist.  Complains of Pain in left middle finger secondary to trauma.  She is not psychotic.  Past Medical History  Diagnosis Date  . Shingles 08/19/12  . Broken finger 08/19/12    History reviewed. No pertinent past surgical history.  No family history on file.  History  Substance Use Topics  . Smoking status: Current Every Day Smoker -- 0.2 packs/day    Types: Cigarettes  . Smokeless tobacco: Not on file  . Alcohol Use: 1.2 oz/week    2 Cans of beer per week    OB History    Grav Para Term Preterm Abortions TAB SAB Ect Mult Living                  Review of Systems  All other systems reviewed and are negative.    Allergies  Review of patient's allergies indicates no known allergies.  Home Medications   Current Outpatient Rx  Name Route Sig Dispense Refill  . ALPRAZOLAM 0.5 MG PO TABS Oral Take 0.5 mg by mouth at bedtime as needed. ANXIETY    . ESOMEPRAZOLE MAGNESIUM 20 MG PO CPDR Oral Take 40 mg by mouth daily before breakfast.      BP 100/55  Pulse 97  Temp 98.7 F (37.1 C) (Oral)  Resp 18  SpO2 100%  LMP 08/11/2012  Physical Exam  Nursing note and vitals reviewed. Constitutional: She is oriented to person, place, and time. She appears well-developed and well-nourished.  HENT:  Head: Normocephalic and atraumatic.  Eyes: Conjunctivae normal and EOM are normal. Pupils are equal, round, and reactive to light.  Neck: Normal range of motion. Neck supple.  Cardiovascular: Normal  rate, regular rhythm and normal heart sounds.   Pulmonary/Chest: Effort normal and breath sounds normal.  Abdominal: Soft. Bowel sounds are normal.  Musculoskeletal:       Tender proximal phalanx of left middle finger  Neurological: She is alert and oriented to person, place, and time.  Skin: Skin is warm and dry.  Psychiatric:       Flat affect    ED Course  Procedures (including critical care time)  Labs Reviewed  CBC WITH DIFFERENTIAL - Abnormal; Notable for the following:    RBC 3.74 (*)     MCV 104.8 (*)     MCH 37.2 (*)     All other components within normal limits  ETHANOL - Abnormal; Notable for the following:    Alcohol, Ethyl (B) 227 (*)     All other components within normal limits  URINE RAPID DRUG SCREEN (HOSP PERFORMED) - Abnormal; Notable for the following:    Tetrahydrocannabinol POSITIVE (*)     All other components within normal limits  BASIC METABOLIC PANEL   Dg Hand Complete Left  08/19/2012  *RADIOLOGY REPORT*  Clinical Data: Left hand pain  LEFT HAND - COMPLETE 3+ VIEW  Comparison: None.  Findings: No fracture or dislocation is seen.  The joint spaces are preserved.  The visualized soft tissues are unremarkable.  IMPRESSION: No fracture or dislocation is seen.  Original Report Authenticated By: Charline Bills, M.D.      1. Depression   2. Left hand pain       MDM  No suicidal or homicidal ideation. Will followup with her normal therapist.        Donnetta Hutching, MD 08/21/12 1001  Donnetta Hutching, MD 09/17/12 [phone]

## 2012-08-19 NOTE — ED Notes (Signed)
Pt sent over by [facility] for medical clearance, states per boyfriend pt SI with plan, pt denies , pt c/o lt arm pain and ?shingles. 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

## 2012-08-19 NOTE — ED Notes (Signed)
Per [facility] at [facility]: pt was dropped off by a "[employer]eriche" mobile crisis today stating pt wants to hurt herself. Per Doristine Johns RN at [facility] states Day Loraine Leriche assessment states she wants to kill herself by jumping off a bridge. Boyfriend also states pt wants to hang herself and has been threatening suicide all day. Pt has stage 4 prostate cancer. He is worried pt will take a lethal dose of his medication. He also has guns in the house. Pt will deny SI, all she wants to do is talk to someone and go home. Pt has brace on arm from slamming into a door two weeks ago. Pt denies being evaluated by MD regarding arm. Pt also claims she active shingles. [facility] recommends a full medical clearance workup

## 2013-03-25 ENCOUNTER — Encounter: Payer: Self-pay | Admitting: *Deleted

## 2013-03-25 ENCOUNTER — Emergency Department (INDEPENDENT_AMBULATORY_CARE_PROVIDER_SITE_OTHER): Payer: BC Managed Care – PPO

## 2013-03-25 ENCOUNTER — Emergency Department (INDEPENDENT_AMBULATORY_CARE_PROVIDER_SITE_OTHER)
Admission: EM | Admit: 2013-03-25 | Discharge: 2013-03-25 | Disposition: A | Discharge status: Home / Self Care | Payer: BC Managed Care – PPO | Source: Home / Self Care | Attending: Family Medicine | Admitting: Family Medicine

## 2013-03-25 DIAGNOSIS — S83419A Sprain of medial collateral ligament of unspecified knee, initial encounter: Secondary | ICD-10-CM

## 2013-03-25 DIAGNOSIS — M25579 Pain in unspecified ankle and joints of unspecified foot: Secondary | ICD-10-CM

## 2013-03-25 DIAGNOSIS — W19XXXA Unspecified fall, initial encounter: Secondary | ICD-10-CM

## 2013-03-25 DIAGNOSIS — S93409A Sprain of unspecified ligament of unspecified ankle, initial encounter: Secondary | ICD-10-CM

## 2013-03-25 DIAGNOSIS — S322XXA Fracture of coccyx, initial encounter for closed fracture: Secondary | ICD-10-CM

## 2013-03-25 DIAGNOSIS — M25569 Pain in unspecified knee: Secondary | ICD-10-CM

## 2013-03-25 DIAGNOSIS — M7989 Other specified soft tissue disorders: Secondary | ICD-10-CM

## 2013-03-25 DIAGNOSIS — S83412A Sprain of medial collateral ligament of left knee, initial encounter: Secondary | ICD-10-CM

## 2013-03-25 DIAGNOSIS — S93402A Sprain of unspecified ligament of left ankle, initial encounter: Secondary | ICD-10-CM

## 2013-03-25 HISTORY — DX: Reserved for inherently not codable concepts without codable children: IMO0001

## 2013-03-25 HISTORY — DX: Fracture of coccyx, initial encounter for closed fracture: S32.2XXA

## 2013-03-25 HISTORY — DX: Gastro-esophageal reflux disease without esophagitis: K21.9

## 2013-03-25 MED ORDER — KETOROLAC TROMETHAMINE 60 MG/2ML IM SOLN
60.0000 mg | Freq: Once | INTRAMUSCULAR | Status: AC
  Administered 2013-03-25: 60 mg via INTRAMUSCULAR

## 2013-03-25 MED ORDER — HYDROCODONE-ACETAMINOPHEN 5-300 MG PO TABS: 1.0000 | ORAL_TABLET | Freq: Four times a day (QID) | ORAL | Status: DC | PRN

## 2013-03-25 NOTE — ED Notes (Signed)
Jennifer Moore fell in small hole today twisting her left knee and ankle in opposite directions. Swelling present. Applied ice. Previous sprain in left knee and ankle.

## 2013-03-25 NOTE — ED Provider Notes (Signed)
History     CSN: [id number]  Arrival date & time 03/25/13  1549   First MD Initiated Contact with Patient 03/25/13 1611      Chief Complaint  Patient presents with  . Leg Pain    left       HPI Comments: Patient stepped into a hole with her left leg just prior to her visit here, inverting her ankle and twisting her knee.  She is unable to bear weight on her left leg at this time.  Patient is a 32 y.o. female presenting with ankle pain. The history is provided by the patient.  Ankle Pain Location:  Knee and ankle Time since incident:  2 hours Injury: yes   Mechanism of injury comment:  Stepped in hole 1 foot deep Knee location:  L knee Ankle location:  L ankle Pain details:    Quality:  Aching and sharp   Radiates to:  Does not radiate   Severity:  Severe   Onset quality:  Sudden   Timing:  Constant Chronicity:  New Dislocation: no   Foreign body present:  No foreign bodies Prior injury to area:  Yes Relieved by:  Nothing Worsened by:  Bearing weight, flexion and extension Ineffective treatments:  None tried Associated symptoms: decreased ROM, stiffness and swelling   Associated symptoms: no back pain, no numbness and no tingling     Past Medical History  Diagnosis Date  . Reflux     History reviewed. No pertinent past surgical history.  History reviewed. No pertinent family history.  History  Substance Use Topics  . Smoking status: Never Smoker   . Smokeless tobacco: Never Used  . Alcohol Use: Yes    OB History   Grav Para Term Preterm Abortions TAB SAB Ect Mult Living                  Review of Systems  Musculoskeletal: Positive for stiffness. Negative for back pain.  All other systems reviewed and are negative.    Allergies  Review of patient's allergies indicates no known allergies.  Home Medications   Current Outpatient Rx  Name  Route  Sig  Dispense  Refill  . esomeprazole (NEXIUM) 20 MG capsule   Oral   Take 20 mg by mouth daily  before breakfast.         . Hydrocodone-Acetaminophen 5-300 MG TABS   Oral   Take 1 tablet by mouth every 6 (six) hours as needed.   16 each   0   . valACYclovir (VALTREX) 1000 MG tablet   Oral   Take 1 tablet (1,000 mg total) by mouth 3 (three) times daily.   21 tablet   0     BP 110/67  Pulse 121  Resp 16  SpO2 97%  LMP 03/23/2013  Physical Exam  Nursing note and vitals reviewed. Constitutional: She is oriented to person, place, and time. She appears well-developed and well-nourished. No distress.  HENT:  Head: Normocephalic and atraumatic.  Eyes: Conjunctivae are normal. Pupils are equal, round, and reactive to light.  Musculoskeletal: She exhibits tenderness.       Left knee: She exhibits decreased range of motion, swelling and bony tenderness. She exhibits no effusion, no ecchymosis, no deformity, no laceration, no erythema and normal alignment. Tenderness found. Medial joint line, lateral joint line and MCL tenderness noted. No patellar tendon tenderness noted.       Left ankle: She exhibits decreased range of motion. She exhibits no swelling,  no ecchymosis, no deformity, no laceration and normal pulse. Tenderness. Lateral malleolus, medial malleolus, AITFL and proximal fibula tenderness found. No CF ligament, no posterior TFL and no head of 5th metatarsal tenderness found. Achilles tendon normal.  Unable to fully evaluate patient's left knee because of diffuse pain.  However she seems to have maximum tenderness over the MCL.  Distal neurovascular function is intact.   Neurological: She is alert and oriented to person, place, and time.  Skin: Skin is warm and dry.    ED Course  Procedures  none   Dg Ankle Complete Left  03/25/2013  *RADIOLOGY REPORT*  Clinical Data: Ankle pain  LEFT ANKLE COMPLETE - 3+ VIEW  Comparison: None.  Findings: Three views of the left ankle submitted.  No acute fracture or subluxation.  Mild soft tissue swelling adjacent to lateral malleolus.   IMPRESSION: No acute fracture or subluxation.  Soft tissue swelling adjacent to lateral malleolus.   Original Report Authenticated By: Natasha Mead, M.D.    Dg Knee Complete 4 Views Left  03/25/2013  *RADIOLOGY REPORT*  Clinical Data: Pain post trauma  LEFT KNEE - COMPLETE 4+ VIEW  Comparison: None.  Findings: Frontal, lateral, and bilateral oblique views were obtained. There is no fracture, dislocation, or effusion.  Joint spaces appear intact.  No erosive change.  IMPRESSION: No abnormality noted.   Original Report Authenticated By: Bretta Bang, M.D.      1. Left ankle sprain, initial encounter   2. Sprain of medial collateral ligament of left knee, initial encounter       MDM  Toradol 60mg  IM Brace applied to left knee.  At patient's request, applied velcro walking boot to left foot/ankle.  Rx for Lortab for pain  Apply ice pack for 30 minutes every 1 to 2 hours today and tomorrow.  Elevate.  Use crutches until follow-up by Dr. Rodney Langton (patient has crutches at home).  Wear brace on knee and ankle.  Begin range of motion and stretching exercises in about 5 days as per instruction sheet.  Followup with Dr. Rodney Langton in four days.        Lattie Haw, MD 03/25/13 [phone]

## 2013-05-08 ENCOUNTER — Emergency Department
Admission: EM | Admit: 2013-05-08 | Discharge: 2013-05-08 | Disposition: A | Discharge status: Home / Self Care | Payer: BC Managed Care – PPO | Source: Home / Self Care | Attending: Family Medicine | Admitting: Family Medicine

## 2013-05-08 ENCOUNTER — Encounter: Payer: Self-pay | Admitting: *Deleted

## 2013-05-08 DIAGNOSIS — B029 Zoster without complications: Secondary | ICD-10-CM

## 2013-05-08 MED ORDER — GABAPENTIN 100 MG PO CAPS: ORAL_CAPSULE | ORAL | Status: DC

## 2013-05-08 MED ORDER — VALACYCLOVIR HCL 1 G PO TABS: 1000.0000 mg | ORAL_TABLET | Freq: Three times a day (TID) | ORAL | Status: DC

## 2013-05-08 MED ORDER — HYDROCODONE-ACETAMINOPHEN 5-300 MG PO TABS: ORAL_TABLET | ORAL | Status: DC

## 2013-05-08 MED ORDER — PREDNISONE 50 MG PO TABS: ORAL_TABLET | ORAL | Status: DC

## 2013-05-08 MED ORDER — METHYLPREDNISOLONE SODIUM SUCC 125 MG IJ SOLR
125.0000 mg | Freq: Once | INTRAMUSCULAR | Status: AC
  Administered 2013-05-08: 125 mg via INTRAMUSCULAR

## 2013-05-08 NOTE — ED Notes (Signed)
Patient c/o bumps on left forearm x 4 days. Patient states it burns and started on lower left forearm and is now spreading.

## 2013-05-08 NOTE — ED Provider Notes (Addendum)
History     CSN: [id number]  Arrival date & time 05/08/13  1230   First MD Initiated Contact with Patient 05/08/13 1231      Chief Complaint  Patient presents with  . Rash   HPI  HPI  This patient complains of a RASH  Location: L forearm   Onset: 3-4 days   Course: rash with new onset burning. Prior hx/o recurrent shingles. Feels like prior episodes.  Has had workup for other infections including HIV and hepatitis per pt.  Self-treated with: nothing   Improvement with treatment: n/a  History  Itching: no  Tenderness: burning   New medications/antibiotics: no  Pet exposure: no  Recent travel or tropical exposure: no  New soaps, shampoos, detergent, clothing: no  Tick/insect exposure: no  Chemical Exposure: no  Red Flags  Feeling ill: no  Fever: no  Facial/tongue swelling/difficulty breathing: no  Diabetic or immunocompromised: no    Past Medical History  Diagnosis Date  . Reflux     History reviewed. No pertinent past surgical history.  History reviewed. No pertinent family history.  History  Substance Use Topics  . Smoking status: Never Smoker   . Smokeless tobacco: Never Used  . Alcohol Use: Yes    OB History   Grav Para Term Preterm Abortions TAB SAB Ect Mult Living                  Review of Systems  All other systems reviewed and are negative.    Allergies  Review of patient's allergies indicates no known allergies.  Home Medications   Current Outpatient Rx  Name  Route  Sig  Dispense  Refill  . esomeprazole (NEXIUM) 20 MG capsule   Oral   Take 20 mg by mouth daily before breakfast.         . gabapentin (NEURONTIN) 100 MG capsule      1 tab po tid, start with 1 tab at night to prevent oversedation   100 capsule   3   . Hydrocodone-Acetaminophen (VICODIN) 5-300 MG TABS      1 tab q8 hours prn pain   5 each   0   . Hydrocodone-Acetaminophen 5-300 MG TABS   Oral   Take 1 tablet by mouth every 6 (six) hours as needed.   16  each   0   . predniSONE (DELTASONE) 50 MG tablet      1 tab daily x 7 days   7 tablet   0   . valACYclovir (VALTREX) 1000 MG tablet   Oral   Take 1 tablet (1,000 mg total) by mouth 3 (three) times daily.   21 tablet   0     BP 107/70  Pulse 110  Temp(Src) 98.5 F (36.9 C) (Oral)  Resp 16  Ht 4\' 10"  (1.473 m)  Wt 130 lb (58.968 kg)  BMI 27.18 kg/m2  SpO2 96%  Physical Exam  Constitutional: She appears well-developed and well-nourished.  HENT:  Head: Normocephalic and atraumatic.  Eyes: Pupils are equal, round, and reactive to light.  Neck: Normal range of motion.  Cardiovascular: Normal rate and regular rhythm.   Pulmonary/Chest: Effort normal.  Abdominal: Soft.  Neurological: She is alert.  Skin: Rash noted.    ED Course  Procedures (including critical care time)  Labs Reviewed - No data to display No results found.   1. Shingles       MDM  Fairly early in disease course Solumedrol 125mg  IM x1  Prednisone x 7 days  Valtrex x 7 days.  Pt noted to be asking for vicodin by name fairly adamantly . Is refusing tramadol, ultracet. Discussed with pt that we normally do not prescribe narcotics at this practice. However, I will give her vicodin 5-300 # 5 tabs to be used at her discretion. Will also rx neurontin as this has become a recurrent issue and this has evidence in neuropathic pain and post herpetic neuralgia.  Pt states that she has had an extensive work up for HIV and hepatitis in the past. ?if zostavax would be of benefit.   Consider infectious disease referral if this continues to recur.     The patient and/or caregiver has been counseled thoroughly with regard to treatment plan and/or medications prescribed including dosage, schedule, interactions, rationale for use, and possible side effects and they verbalize understanding. Diagnoses and expected course of recovery discussed and will return if not improved as expected or if the condition worsens.  Patient and/or caregiver verbalized understanding.             Doree Albee, MD 05/08/13 1341  Doree Albee, MD 05/08/13 [phone]

## 2013-10-04 ENCOUNTER — Encounter: Payer: Self-pay | Admitting: Emergency Medicine

## 2013-10-04 ENCOUNTER — Emergency Department (INDEPENDENT_AMBULATORY_CARE_PROVIDER_SITE_OTHER)
Admission: EM | Admit: 2013-10-04 | Discharge: 2013-10-04 | Disposition: A | Discharge status: Home / Self Care | Payer: BC Managed Care – PPO | Source: Home / Self Care | Attending: Family Medicine | Admitting: Family Medicine

## 2013-10-04 ENCOUNTER — Emergency Department (INDEPENDENT_AMBULATORY_CARE_PROVIDER_SITE_OTHER): Payer: BC Managed Care – PPO

## 2013-10-04 DIAGNOSIS — R358 Other polyuria: Secondary | ICD-10-CM

## 2013-10-04 DIAGNOSIS — R05 Cough: Secondary | ICD-10-CM

## 2013-10-04 DIAGNOSIS — J029 Acute pharyngitis, unspecified: Secondary | ICD-10-CM

## 2013-10-04 LAB — POCT CBC W AUTO DIFF (K'VILLE URGENT CARE)

## 2013-10-04 LAB — POCT URINALYSIS DIPSTICK
Bilirubin, UA: NEGATIVE
Nitrite, UA: NEGATIVE
Protein, UA: NEGATIVE
pH, UA: 7 (ref 5–8)

## 2013-10-04 LAB — POCT RAPID STREP A (OFFICE): Rapid Strep A Screen: NEGATIVE

## 2013-10-04 LAB — POCT URINE PREGNANCY: Preg Test, Ur: NEGATIVE

## 2013-10-04 LAB — POCT INFLUENZA A/B: Influenza A, POC: NEGATIVE

## 2013-10-04 MED ORDER — AMOXICILLIN 875 MG PO TABS: 875.0000 mg | ORAL_TABLET | Freq: Two times a day (BID) | ORAL | Status: DC

## 2013-10-04 NOTE — ED Notes (Signed)
Jennifer Moore c/o sore throat, dry cough, body aches and dizziness x 3 days. Low grade fever of 100.3 this AM. Her boyfriend reports being tested + for Influenza B and Mono recently. She has not had a flu vaccine this year.

## 2013-10-04 NOTE — ED Provider Notes (Signed)
CSN: [id number]     Arrival date & time 10/04/13  1453 History   First MD Initiated Contact with Patient 10/04/13 1606     Chief Complaint  Patient presents with  . Sore Throat     HPI Comments: Patient developed a mild sore throat and cough one week ago.  Over the past 3 days she has become hoarse with increasing myalgias, dizziness, chills, and low grade fever.  She has had some nausea and loose stools.  She also complains of increased urinary frequency without dysuria. Her boyfriend recently tested positive for both influenza B and Mono.  She has not had flu immunization this year.  The history is provided by the patient and a friend.    Past Medical History  Diagnosis Date  . Reflux    History reviewed. No pertinent past surgical history. History reviewed. No pertinent family history. History  Substance Use Topics  . Smoking status: Never Smoker   . Smokeless tobacco: Never Used  . Alcohol Use: Yes   OB History   Grav Para Term Preterm Abortions TAB SAB Ect Mult Living                 Review of Systems + sore throat + cough + dizziness No pleuritic pain No wheezing + nasal congestion + post-nasal drainage No sinus pain/pressure No itchy/red eyes No earache No hemoptysis No SOB + fever, + chills + nausea No vomiting No abdominal pain + diarrhea + urinary frequency No skin rash + fatigue + myalgias No headache Used OTC meds without relief  Allergies  Review of patient's allergies indicates no known allergies.  Home Medications   Current Outpatient Rx  Name  Route  Sig  Dispense  Refill  . ALPRAZolam (XANAX) 0.5 MG tablet   Oral   Take 0.5 mg by mouth at bedtime as needed for anxiety.         Marland Kitchen amoxicillin (AMOXIL) 875 MG tablet   Oral   Take 1 tablet (875 mg total) by mouth 2 (two) times daily.   14 tablet   0   . esomeprazole (NEXIUM) 20 MG capsule   Oral   Take 20 mg by mouth daily before breakfast.         . gabapentin (NEURONTIN)  100 MG capsule      1 tab po tid, start with 1 tab at night to prevent oversedation   100 capsule   3    BP 107/71  Pulse 87  Temp(Src) 98.8 F (37.1 C) (Oral)  Resp 14  Wt 130 lb (58.968 kg)  SpO2 95% Physical Exam Nursing notes and Vital Signs reviewed. Appearance:  Patient appears healthy, stated age, and in no acute distress Eyes:  Pupils are equal, round, and reactive to light and accomodation.  Extraocular movement is intact.  Conjunctivae are not inflamed  Ears:  Canals normal.  Tympanic membranes normal.  Nose:  Mildly congested turbinates.  No sinus tenderness.   Pharynx:  Mildly erythematous Neck:  Supple.  Slightly tender shotty anterior/posterior nodes are palpated bilaterally  Lungs:  Clear to auscultation.  Breath sounds are equal.  Heart:  Regular rate and rhythm without murmurs, rubs, or gallops.  Abdomen:  Nontender without masses or hepatosplenomegaly.  Bowel sounds are present.  No CVA or flank tenderness.  Extremities:  No edema.  No calf tenderness Skin:  No rash present.   ED Course  Procedures  none Labs Review Labs Reviewed  STREP A DNA PROBE  URINE CULTURE  POCT RAPID STREP A (OFFICE) negative  POCT INFLUENZA A/B negative  POCT MONO SCREEN ([facility]) negative  POCT CBC W AUTO DIFF ([facility]) WBC 3.9; LY 22.5; MO 5.9; GR 71.6; Hgb 13.4; Platelets 238   POCT URINALYSIS DIPSTICK:  LEU trace, otherwise negative  POCT URINE PREGNANCY negative   Imaging Review Dg Chest 2 View  10/04/2013   CLINICAL DATA:  Cough, congestion  EXAM: CHEST  2 VIEW  COMPARISON:  None.  FINDINGS: The lungs are clear and negative for focal airspace consolidation, pulmonary edema or suspicious pulmonary nodule. No pleural effusion or pneumothorax. Cardiac and mediastinal contours are within normal limits. No acute fracture or lytic or blastic osseous lesions. The visualized upper abdominal bowel gas pattern is unremarkable.  IMPRESSION: No active cardiopulmonary disease.    Electronically Signed   By: Malachy Moan M.D.   On: 10/04/2013 16:55      MDM   1. Polyuria; rule out UTI   2. Acute pharyngitis; suspect viral URI    Throat culture and urine culture pending Empirically begin amoxicillin Increase fluid intake.  May take Tylenol or ibuprofen for fever. Followup with Family Doctor if not improved in one week.     Lattie Haw, MD 10/06/13 [phone]

## 2013-10-06 ENCOUNTER — Telehealth: Payer: Self-pay | Admitting: *Deleted

## 2013-10-06 LAB — URINE CULTURE: Culture: 75000

## 2013-10-22 ENCOUNTER — Emergency Department (INDEPENDENT_AMBULATORY_CARE_PROVIDER_SITE_OTHER)
Admission: EM | Admit: 2013-10-22 | Discharge: 2013-10-22 | Disposition: A | Discharge status: Home / Self Care | Payer: BC Managed Care – PPO | Source: Home / Self Care | Attending: Family Medicine | Admitting: Family Medicine

## 2013-10-22 ENCOUNTER — Encounter: Payer: Self-pay | Admitting: Emergency Medicine

## 2013-10-22 DIAGNOSIS — R300 Dysuria: Secondary | ICD-10-CM

## 2013-10-22 DIAGNOSIS — N3000 Acute cystitis without hematuria: Secondary | ICD-10-CM

## 2013-10-22 LAB — POCT URINALYSIS DIP (MANUAL ENTRY)
Bilirubin, UA: NEGATIVE
Blood, UA: NEGATIVE
Glucose, UA: NEGATIVE
Ketones, POC UA: NEGATIVE
Leukocytes, UA: NEGATIVE
Nitrite, UA: NEGATIVE
Protein Ur, POC: NEGATIVE
Spec Grav, UA: <=1.005 (ref 1.005–1.03)
Urobilinogen, UA: 0.2 (ref 0–1)
pH, UA: 6.5 (ref 5–8)

## 2013-10-22 LAB — POCT URINE PREGNANCY: Preg Test, Ur: NEGATIVE

## 2013-10-22 MED ORDER — PHENAZOPYRIDINE HCL 200 MG PO TABS: 200.0000 mg | ORAL_TABLET | Freq: Three times a day (TID) | ORAL | Status: DC

## 2013-10-22 MED ORDER — SULFAMETHOXAZOLE-TMP DS 800-160 MG PO TABS: 1.0000 | ORAL_TABLET | Freq: Two times a day (BID) | ORAL | Status: DC

## 2013-10-22 NOTE — ED Provider Notes (Signed)
CSN: [id number]     Arrival date & time 10/22/13  1313 History   First MD Initiated Contact with Patient 10/22/13 1404     Chief Complaint  Patient presents with  . Dysuria      HPI Comments: Patient reports that her previous urinary symptoms resolved after taking Cipro.  Last night she developed dysuria, urgency, hesitancy, and low back ache.  No fevers, chills, and sweats.  Patient's last menstrual period was 09/06/2013.  No pelvic symptoms.  Patient is a 32 y.o. female presenting with dysuria. The history is provided by the patient.  Dysuria Pain quality:  Sharp Pain severity:  Moderate Onset quality:  Sudden Duration:  15 hours Timing:  Constant Progression:  Unchanged Chronicity:  Recurrent Recent urinary tract infections: yes   Relieved by:  Nothing Ineffective treatments:  Cranberry juice Urinary symptoms: frequent urination and hesitancy   Urinary symptoms: no discolored urine, no foul-smelling urine, no hematuria and no bladder incontinence   Associated symptoms: no abdominal pain, no fever, no flank pain, no genital lesions, no nausea, no vaginal discharge and no vomiting     Past Medical History  Diagnosis Date  . Reflux    History reviewed. No pertinent past surgical history. No family history on file. History  Substance Use Topics  . Smoking status: Never Smoker   . Smokeless tobacco: Never Used  . Alcohol Use: Yes   OB History   Grav Para Term Preterm Abortions TAB SAB Ect Mult Living                 Review of Systems  Constitutional: Negative for fever.  Gastrointestinal: Negative for nausea, vomiting and abdominal pain.  Genitourinary: Positive for dysuria. Negative for flank pain and vaginal discharge.  All other systems reviewed and are negative.    Allergies  Review of patient's allergies indicates not on file.  Home Medications   Current Outpatient Rx  Name  Route  Sig  Dispense  Refill  . ALPRAZolam (XANAX) 0.5 MG tablet   Oral   Take  0.5 mg by mouth at bedtime as needed for anxiety.         Marland Kitchen esomeprazole (NEXIUM) 20 MG capsule   Oral   Take 20 mg by mouth daily before breakfast.         . gabapentin (NEURONTIN) 100 MG capsule      1 tab po tid, start with 1 tab at night to prevent oversedation   100 capsule   3   . phenazopyridine (PYRIDIUM) 200 MG tablet   Oral   Take 1 tablet (200 mg total) by mouth 3 (three) times daily. Take with food.   6 tablet   0   . sulfamethoxazole-trimethoprim (BACTRIM DS) 800-160 MG per tablet   Oral   Take 1 tablet by mouth 2 (two) times daily.   14 tablet   0    BP 108/73  Pulse 84  Temp(Src) 98 F (36.7 C) (Oral)  Ht 4\' 10"  (1.473 m)  Wt 131 lb (59.421 kg)  BMI 27.39 kg/m2  SpO2 99%  LMP 09/06/2013 Physical Exam Nursing notes and Vital Signs reviewed. Appearance:  Patient appears healthy, stated age, and in no acute distress Eyes:  Pupils are equal, round, and reactive to light and accomodation.  Extraocular movement is intact.  Conjunctivae are not inflamed  Pharynx:  Normal Neck:  Supple.   No adenopathy Lungs:  Clear to auscultation.  Breath sounds are equal.  Heart:  Regular  rate and rhythm without murmurs, rubs, or gallops.  Abdomen:   Tenderness over bladder without masses or hepatosplenomegaly.  Bowel sounds are present.  No CVA or flank tenderness.  Extremities:  No edema.  No calf tenderness Skin:  No rash present.   ED Course  Procedures  none    Labs Reviewed  URINE CULTURE  POCT URINALYSIS DIP (MANUAL ENTRY) negative         MDM   1. Dysuria   2. Acute cystitis   Urine culture pending.  Although not having vaginal/pelvic symptoms, patient requests GC/chlamydia screen also Begin Septra DS and Pyridium. Increase fluid intake. If symptoms become significantly worse during the night or over the weekend, proceed to the local emergency room. Followup with Family Doctor if not improved in one week.     Lattie Haw, MD 10/22/13  [phone]

## 2013-10-22 NOTE — ED Notes (Signed)
Dysuria started yesterday, dx with e coli on 10/04/13, took Cipro (all but last dose), last night having dysuria, polyuria

## 2013-10-24 LAB — URINE CULTURE: Colony Count: 9000

## 2013-10-25 LAB — GC/CHLAMYDIA PROBE AMP

## 2013-10-28 ENCOUNTER — Telehealth: Payer: Self-pay | Admitting: *Deleted

## 2013-12-09 ENCOUNTER — Encounter: Payer: Self-pay | Admitting: Certified Nurse Midwife

## 2013-12-09 ENCOUNTER — Telehealth: Payer: Self-pay | Admitting: *Deleted

## 2013-12-09 ENCOUNTER — Ambulatory Visit (INDEPENDENT_AMBULATORY_CARE_PROVIDER_SITE_OTHER): Payer: BC Managed Care – PPO | Admitting: Certified Nurse Midwife

## 2013-12-09 VITALS — BP 94/60 | HR 60 | Resp 16 | Ht <= 58 in | Wt 128.0 lb

## 2013-12-09 DIAGNOSIS — B373 Candidiasis of vulva and vagina: Secondary | ICD-10-CM

## 2013-12-09 DIAGNOSIS — Z0000 Encounter for general adult medical examination without abnormal findings: Secondary | ICD-10-CM

## 2013-12-09 DIAGNOSIS — Z0189 Encounter for other specified special examinations: Secondary | ICD-10-CM

## 2013-12-09 DIAGNOSIS — N9489 Other specified conditions associated with female genital organs and menstrual cycle: Secondary | ICD-10-CM

## 2013-12-09 DIAGNOSIS — R350 Frequency of micturition: Secondary | ICD-10-CM

## 2013-12-09 DIAGNOSIS — B3731 Acute candidiasis of vulva and vagina: Secondary | ICD-10-CM

## 2013-12-09 DIAGNOSIS — Z202 Contact with and (suspected) exposure to infections with a predominantly sexual mode of transmission: Secondary | ICD-10-CM

## 2013-12-09 LAB — POCT URINALYSIS DIPSTICK
Bilirubin, UA: NEGATIVE
Blood, UA: NEGATIVE
Glucose, UA: NEGATIVE
Ketones, UA: NEGATIVE
Nitrite, UA: NEGATIVE
Protein, UA: NEGATIVE
Urobilinogen, UA: NEGATIVE
pH, UA: 5

## 2013-12-09 MED ORDER — FLUCONAZOLE 150 MG PO TABS: ORAL_TABLET | ORAL | Status: DC

## 2013-12-09 NOTE — Telephone Encounter (Signed)
yes

## 2013-12-09 NOTE — Patient Instructions (Addendum)
General topics  Next pap or exam is  due in 1 year Take a Women's multivitamin Take 1200 mg. of calcium daily - prefer dietary If any concerns in interim to call back  Breast Self-Awareness Practicing breast self-awareness may pick up problems early, prevent significant medical complications, and possibly save your life. By practicing breast self-awareness, you can become familiar with how your breasts look and feel and if your breasts are changing. This allows you to notice changes early. It can also offer you some reassurance that your breast health is good. One way to learn what is normal for your breasts and whether your breasts are changing is to do a breast self-exam. If you find a lump or something that was not present in the past, it is best to contact your caregiver right away. Other findings that should be evaluated by your caregiver include nipple discharge, especially if it is bloody; skin changes or reddening; areas where the skin seems to be pulled in (retracted); or new lumps and bumps. Breast pain is seldom associated with cancer (malignancy), but should also be evaluated by a caregiver. BREAST SELF-EXAM The best time to examine your breasts is 5 7 days after your menstrual period is over.  ExitCare Patient Information 2013 [employer].   Exercise to Stay Healthy Exercise helps you become and stay healthy. EXERCISE IDEAS AND TIPS Choose exercises that:  You enjoy.  Fit into your day. You do not need to exercise really hard to be healthy. You can do exercises at a slow or medium level and stay healthy. You can:  Stretch before and after working out.  Try yoga, Pilates, or tai chi.  Lift weights.  Walk fast, swim, jog, run, climb stairs, bicycle, dance, or rollerskate.  Take aerobic classes. Exercises that burn about 150 calories:  Running 1  miles in 15 minutes.  Playing volleyball for 45 to 60 minutes.  Washing and waxing a car for 45 to 60  minutes.  Playing touch football for 45 minutes.  Walking 1  miles in 35 minutes.  Pushing a stroller 1  miles in 30 minutes.  Playing basketball for 30 minutes.  Raking leaves for 30 minutes.  Bicycling 5 miles in 30 minutes.  Walking 2 miles in 30 minutes.  Dancing for 30 minutes.  Shoveling snow for 15 minutes.  Swimming laps for 20 minutes.  Walking up stairs for 15 minutes.  Bicycling 4 miles in 15 minutes.  Gardening for 30 to 45 minutes.  Jumping rope for 15 minutes.  Washing windows or floors for 45 to 60 minutes. Document Released: 12/14/2010 Document Revised: 02/03/2012 Document Reviewed: 12/14/2010 ExitCare Patient Information 2013 [employer]Care[employer].   Other topics ( that may be useful information):    Sexually Transmitted Disease Sexually transmitted disease (STD) refers to any infection that is passed from person to person during sexual activity. This may happen by way of saliva, semen, blood, vaginal mucus, or urine. Common STDs include:  Gonorrhea.  Chlamydia.  Syphilis.  HIV/AIDS.  Genital herpes.  Hepatitis B and C.  Trichomonas.  Human papillomavirus (HPV).  Pubic lice. CAUSES  An STD may be spread by bacteria, virus, or parasite. A person can get an STD by:  Sexual intercourse with an infected person.  Sharing sex toys with an infected person.  Sharing needles with an infected person.  Having intimate contact with the genitals, mouth, or rectal areas of an infected person. SYMPTOMS  Some people may not have any symptoms, but   they can still pass the infection to others. Different STDs have different symptoms. Symptoms include:  Painful or bloody urination.  Pain in the pelvis, abdomen, vagina, anus, throat, or eyes.  Skin rash, itching, irritation, growths, or sores (lesions). These usually occur in the genital or anal area.  Abnormal vaginal discharge.  Penile discharge in men.  Soft, flesh-colored skin growths in the  genital or anal area.  Fever.  Pain or bleeding during sexual intercourse.  Swollen glands in the groin area.  Yellow skin and eyes (jaundice). This is seen with hepatitis. DIAGNOSIS  To make a diagnosis, your caregiver may:  Take a medical history.  Perform a physical exam.  Take a specimen (culture) to be examined.  Examine a sample of discharge under a microscope.  Perform blood test TREATMENT   Chlamydia, gonorrhea, trichomonas, and syphilis can be cured with antibiotic medicine.  Genital herpes, hepatitis, and HIV can be treated, but not cured, with prescribed medicines. The medicines will lessen the symptoms.  Genital warts from HPV can be treated with medicine or by freezing, burning (electrocautery), or surgery. Warts may come back.  HPV is a virus and cannot be cured with medicine or surgery.However, abnormal areas may be followed very closely by your caregiver and may be removed from the cervix, vagina, or vulva through office procedures or surgery. If your diagnosis is confirmed, your recent sexual partners need treatment. This is true even if they are symptom-free or have a negative culture or evaluation. They should not have sex until their caregiver says it is okay. HOME CARE INSTRUCTIONS  All sexual partners should be informed, tested, and treated for all STDs.  Take your antibiotics as directed. Finish them even if you start to feel better.  Only take over-the-counter or prescription medicines for pain, discomfort, or fever as directed by your caregiver.  Rest.  Eat a balanced diet and drink enough fluids to keep your urine clear or pale yellow.  Do not have sex until treatment is completed and you have followed up with your caregiver. STDs should be checked after treatment.  Keep all follow-up appointments, Pap tests, and blood tests as directed by your caregiver.  Only use latex condoms and water-soluble lubricants during sexual activity. Do not use  petroleum jelly or oils.  Avoid alcohol and illegal drugs.  Get vaccinated for HPV and hepatitis. If you have not received these vaccines in the past, talk to your caregiver about whether one or both might be right for you.  Avoid risky sex practices that can break the skin. The only way to avoid getting an STD is to avoid all sexual activity.Latex condoms and dental dams (for oral sex) will help lessen the risk of getting an STD, but will not completely eliminate the risk. SEEK MEDICAL CARE IF:   You have a fever.  You have any new or worsening symptoms. Document Released: 02/01/2003 Document Revised: 02/03/2012 Document Reviewed: 02/08/2011 [facility] Patient Information 2013 [place].    Domestic Abuse You are being battered or abused if someone close to you hits, pushes, or physically hurts you in any way. You also are being abused if you are forced into activities. You are being sexually abused if you are forced to have sexual contact of any kind. You are being emotionally abused if you are made to feel worthless or if you are constantly threatened. It is important to remember that help is available. No one has the right to abuse you. PREVENTION OF FURTHER  ABUSE  Learn the warning signs of danger. This varies with situations but may include: the use of alcohol, threats, isolation from friends and family, or forced sexual contact. Leave if you feel that violence is going to occur.  If you are attacked or beaten, report it to the police so the abuse is documented. You do not have to press charges. The police can protect you while you or the attackers are leaving. Get the officer's name and badge number and a copy of the report.  Find someone you can trust and tell them what is happening to you: your caregiver, a nurse, clergy member, close friend or family member. Feeling ashamed is natural, but remember that you have done nothing wrong. No one deserves abuse. Document Released:  11/08/2000 Document Revised: 02/03/2012 Document Reviewed: 01/17/2011 ExitCare Patient Information 2013 ExitCare[employer].    How Much is Too Much Alcohol? Drinking too much alcohol can cause injury, accidents, and health problems. These types of problems can include:   Car crashes.  Falls.  Family fighting (domestic violence).  Drowning.  Fights.  Injuries.  Burns.  Damage to certain organs.  Having a baby with birth defects. ONE DRINK CAN BE TOO MUCH WHEN YOU ARE:  Working.  Pregnant or breastfeeding.  Taking medicines. Ask your doctor.  Driving or planning to drive. If you or someone you know has a drinking problem, get help from a doctor.  Document Released: 09/07/2009 Document Revised: 02/03/2012 Document Reviewed: 09/07/2009 ExitCare Patient Information 2013 ExitCare[employer].   Smoking Hazards Smoking cigarettes is extremely bad for your health. Tobacco smoke has over 200 known poisons in it. There are over 60 chemicals in tobacco smoke that cause cancer. Some of the chemicals found in cigarette smoke include:   Cyanide.  Benzene.  Formaldehyde.  Methanol (wood alcohol).  Acetylene (fuel used in welding torches).  Ammonia. Cigarette smoke also contains the poisonous gases nitrogen oxide and carbon monoxide.  Cigarette smokers have an increased risk of many serious medical problems and Smoking causes approximately:  90% of all lung cancer deaths in men.  80% of all lung cancer deaths in women.  90% of deaths from chronic obstructive lung disease. Compared with nonsmokers, smoking increases the risk of:  Coronary heart disease by 2 to 4 times.  Stroke by 2 to 4 times.  Men developing lung cancer by 23 times.  Women developing lung cancer by 13 times.  Dying from chronic obstructive lung diseases by 12 times.  . Smoking is the most preventable cause of death and disease in our society.  WHY IS SMOKING ADDICTIVE?  Nicotine is the chemical  agent in tobacco that is capable of causing addiction or dependence.  When you smoke and inhale, nicotine is absorbed rapidly into the bloodstream through your lungs. Nicotine absorbed through the lungs is capable of creating a powerful addiction. Both inhaled and non-inhaled nicotine may be addictive.  Addiction studies of cigarettes and spit tobacco show that addiction to nicotine occurs mainly during the teen years, when young people begin using tobacco products. WHAT ARE THE BENEFITS OF QUITTING?  There are many health benefits to quitting smoking.   Likelihood of developing cancer and heart disease decreases. Health improvements are seen almost immediately.  Blood pressure, pulse rate, and breathing patterns start returning to normal soon after quitting. QUITTING SMOKING   American Lung Association - 1-800-LUNGUSA  American Cancer Society - 1-800-ACS-2345 Document Released: 12/19/2004 Document Revised: 02/03/2012 Document Reviewed: 08/23/2009 ExitCare Patient Information 2013 ExitCare,   LLC.   Stress Management Stress is a state of physical or mental tension that often results from changes in your life or normal routine. Some common causes of stress are:  Death of a loved one.  Injuries or severe illnesses.  Getting fired or changing jobs.  Moving into a new home. Other causes may be:  Sexual problems.  Business or financial losses.  Taking on a large debt.  Regular conflict with someone at home or at work.  Constant tiredness from lack of sleep. It is not just bad things that are stressful. It may be stressful to:  Win the lottery.  Get married.  Buy a new car. The amount of stress that can be easily tolerated varies from person to person. Changes generally cause stress, regardless of the types of change. Too much stress can affect your health. It may lead to physical or emotional problems. Too little stress (boredom) may also become stressful. SUGGESTIONS TO  REDUCE STRESS:  Talk things over with your family and friends. It often is helpful to share your concerns and worries. If you feel your problem is serious, you may want to get help from a professional counselor.  Consider your problems one at a time instead of lumping them all together. Trying to take care of everything at once may seem impossible. List all the things you need to do and then start with the most important one. Set a goal to accomplish 2 or 3 things each day. If you expect to do too many in a single day you will naturally fail, causing you to feel even more stressed.  Do not use alcohol or drugs to relieve stress. Although you may feel better for a short time, they do not remove the problems that caused the stress. They can also be habit forming.  Exercise regularly - at least 3 times per week. Physical exercise can help to relieve that "uptight" feeling and will relax you.  The shortest distance between despair and hope is often a good night's sleep.  Go to bed and get up on time allowing yourself time for appointments without being rushed.  Take a short "time-out" period from any stressful situation that occurs during the day. Close your eyes and take some deep breaths. Starting with the muscles in your face, tense them, hold it for a few seconds, then relax. Repeat this with the muscles in your neck, shoulders, hand, stomach, back and legs.  Take good care of yourself. Eat a balanced diet and get plenty of rest.  Schedule time for having fun. Take a break from your daily routine to relax. HOME CARE INSTRUCTIONS   Call if you feel overwhelmed by your problems and feel you can no longer manage them on your own.  Return immediately if you feel like hurting yourself or someone else. Document Released: 05/07/2001 Document Revised: 02/03/2012 Document Reviewed: 12/28/2007 ExitCare Patient Information 2013 ExitCare, LLC.   Monilial Vaginitis Vaginitis in a soreness, swelling  and redness (inflammation) of the vagina and vulva. Monilial vaginitis is not a sexually transmitted infection. CAUSES  Yeast vaginitis is caused by yeast (candida) that is normally found in your vagina. With a yeast infection, the candida has overgrown in number to a point that upsets the chemical balance. SYMPTOMS   White, thick vaginal discharge.  Swelling, itching, redness and irritation of the vagina and possibly the lips of the vagina (vulva).  Burning or painful urination.  Painful intercourse. DIAGNOSIS  Things that may contribute to   vaginitis are:  Postmenopausal and virginal states.  Pregnancy.  Infections.  Being tired, sick or stressed, especially if you had monilial vaginitis in the past.  Diabetes. Good control will help lower the chance.  Birth control pills.  Tight fitting garments.  Using bubble bath, feminine sprays, douches or deodorant tampons.  Taking certain medications that kill germs (antibiotics).  Sporadic recurrence can occur if you become ill. TREATMENT  Your caregiver will give you medication.  There are several kinds of anti monilial vaginal creams and suppositories specific for monilial vaginitis. For recurrent yeast infections, use a suppository or cream in the vagina 2 times a week, or as directed.  Anti-monilial or steroid cream for the itching or irritation of the vulva may also be used. Get your caregiver's permission.  Painting the vagina with methylene blue solution may help if the monilial cream does not work.  Eating yogurt may help prevent monilial vaginitis. HOME CARE INSTRUCTIONS   Finish all medication as prescribed.  Do not have sex until treatment is completed or after your caregiver tells you it is okay.  Take warm sitz baths.  Do not douche.  Do not use tampons, especially scented ones.  Wear cotton underwear.  Avoid tight pants and panty hose.  Tell your sexual partner that you have a yeast  infection. They should go to their caregiver if they have symptoms such as mild rash or itching.  Your sexual partner should be treated as well if your infection is difficult to eliminate.  Practice safer sex. Use condoms.  Some vaginal medications cause latex condoms to fail. Vaginal medications that harm condoms are:  Cleocin cream.  Butoconazole (Femstat).  Terconazole (Terazol) vaginal suppository.  Miconazole (Monistat) (may be purchased over the counter). SEEK MEDICAL CARE IF:   You have a temperature by mouth above 102 F (38.9 C).  The infection is getting worse after 2 days of treatment.  The infection is not getting better after 3 days of treatment.  You develop blisters in or around your vagina.  You develop vaginal bleeding, and it is not your menstrual period.  You have pain when you urinate.  You develop intestinal problems.  You have pain with sexual intercourse. Document Released: 08/21/2005 Document Revised: 02/03/2012 Document Reviewed: 05/05/2009 [facility] Patient Information 2014 [place], [place].

## 2013-12-09 NOTE — Progress Notes (Signed)
32 y.o.Single Native American female [id number]  Here to establish gyn care for problem only. Patient complaining of vaginal irritation, itching, and burning with end of stream urination and some frequency or urgency..Patient denies fever or chills. No association to sexual activity. Patient tried OTC one day Monistat with  Some relief. Denies new personal products or thong underwear.Patient also desires STD screening and UPT. Contraception is consistent condoms.Sexually active. Last sexual activity:2days ago. Patient aex due 3/15. Plans on scheduling here and continuing with PCP for other management. No other health issues today.  O: Healthy female WDWN Affect:normal, orientation x 3 Skin: warm and dry Abdomen: soft, negative suprapubic pain, non tender CVAT: negative bilateral   Exam:  [id number]:Bartholin's, Urethra, Skene's normal, bladder and urethral meatus non tender, no lesions noted                Vag:no lesions, discharge: copious, curd-like and odorless, pH 4.0, wet prep done                Cx:  normal appearance and non tender                Uterus:normal size, non-tender, normal shape and consistency                Adnexa: left adnexal normal, no masses or tenderness, right adnexal questionable mass palpated,not tender, no fullness  Perineal area: no lesions noted  Wet Prep shows:positive for yeast, negative for BV,trich  Poct urine-wbc tr POCT UPT negative  A: Yeast vaginitis Questionable right adnexal mass STD screening R/O UTI  P:Reviewed findings. Discussed importance of perineal hygiene to help prevent yeast and other vaginal problems. Rx Diflucan see order Discussed finding and need for evaluation. Patient agreeable. Warning signs and symptoms of pelvic and abdominal pain given, patient to advise if occurs. Will consult with Dr. Hyacinth MeekerMiller for PUS. Lab:GC,Chlamydia,HIV,RPR, Hep B, HepC, Increase water intake. Aware of UTI warning signs.  Lab Urine culture/micro  Rv as above, prn,  aex

## 2013-12-09 NOTE — Telephone Encounter (Signed)
Patient calling back to confirm she may continue her probiotics and cranberry 500 tablets while on medications from Debbi. Advised probiotics were fine and even encouraged but will need to check on the cranberry tablets. Please advise.

## 2013-12-09 NOTE — Telephone Encounter (Signed)
Cranberry tablets are OK to continue

## 2013-12-09 NOTE — Telephone Encounter (Signed)
Patient notified that Jennifer Moore confirms ok to continue both probiotics and cranberry tabs.  Patient asking if she is suppose to return to see Jennifer Moore after medication.  Advised she should wait 72 hours after second Diflucan and is symptoms are not resolved, to call back.  Is this correct?

## 2013-12-10 ENCOUNTER — Telehealth: Payer: Self-pay | Admitting: Gynecology

## 2013-12-10 LAB — STD PANEL
HEP B S AG: NEGATIVE
HIV: NONREACTIVE

## 2013-12-10 LAB — IPS N GONORRHOEA AND CHLAMYDIA BY PCR

## 2013-12-10 LAB — HEPATITIS C ANTIBODY: HCV AB: NEGATIVE

## 2013-12-10 NOTE — Telephone Encounter (Signed)
Telephoned patient/ advised of $408.26 pr for PUS/ scheduled PUS/ advised patient of cancellation notice and cancellation fee/ patient agrees//ssf

## 2013-12-10 NOTE — Progress Notes (Signed)
Reviewed personally.  M. Suzanne Raji Glinski, MD.  

## 2013-12-11 ENCOUNTER — Other Ambulatory Visit: Payer: Self-pay | Admitting: Certified Nurse Midwife

## 2013-12-11 DIAGNOSIS — N390 Urinary tract infection, site not specified: Secondary | ICD-10-CM

## 2013-12-11 LAB — URINE CULTURE: Colony Count: >=100000

## 2013-12-11 MED ORDER — CIPROFLOXACIN HCL 500 MG PO TABS: 500.0000 mg | ORAL_TABLET | Freq: Two times a day (BID) | ORAL | Status: DC

## 2013-12-14 ENCOUNTER — Other Ambulatory Visit: Payer: BC Managed Care – PPO | Admitting: Gynecology

## 2013-12-14 ENCOUNTER — Other Ambulatory Visit: Payer: BC Managed Care – PPO

## 2013-12-15 DIAGNOSIS — IMO0002 Reserved for concepts with insufficient information to code with codable children: Secondary | ICD-10-CM

## 2013-12-15 HISTORY — DX: Reserved for concepts with insufficient information to code with codable children: IMO0002

## 2013-12-16 ENCOUNTER — Telehealth: Payer: Self-pay | Admitting: Certified Nurse Midwife
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
## 2013-12-16 NOTE — Telephone Encounter (Signed)
She should be seen for follow-up so we can review what was done for treatment and make sure her physical exam is normal.  Debbi will help her process this as well.  Please offer OV.
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
## 2013-12-17 NOTE — Telephone Encounter (Signed)
Dr. Hyacinth MeekerMiller, patient is scheduled with you for PUS/Consult with you for 1/27, okay to wait until then?

## 2013-12-17 NOTE — Telephone Encounter (Signed)
We will make Jasmine DecemberSharon aware of situation because ultrasound and can assess pt before test starts.  Encounter closed.

## 2013-12-21 ENCOUNTER — Other Ambulatory Visit: Payer: BC Managed Care – PPO

## 2013-12-21 ENCOUNTER — Ambulatory Visit (INDEPENDENT_AMBULATORY_CARE_PROVIDER_SITE_OTHER): Payer: BC Managed Care – PPO

## 2013-12-21 ENCOUNTER — Other Ambulatory Visit: Payer: BC Managed Care – PPO | Admitting: Gynecology

## 2013-12-21 ENCOUNTER — Other Ambulatory Visit: Payer: Self-pay | Admitting: Obstetrics & Gynecology

## 2013-12-21 ENCOUNTER — Encounter: Payer: Self-pay | Admitting: Obstetrics & Gynecology

## 2013-12-21 ENCOUNTER — Ambulatory Visit (INDEPENDENT_AMBULATORY_CARE_PROVIDER_SITE_OTHER): Payer: BC Managed Care – PPO | Admitting: Obstetrics & Gynecology

## 2013-12-21 VITALS — BP 100/56 | HR 64 | Resp 14 | Wt 132.8 lb

## 2013-12-21 DIAGNOSIS — N9489 Other specified conditions associated with female genital organs and menstrual cycle: Secondary | ICD-10-CM

## 2013-12-21 DIAGNOSIS — B373 Candidiasis of vulva and vagina: Secondary | ICD-10-CM

## 2013-12-21 DIAGNOSIS — N390 Urinary tract infection, site not specified: Secondary | ICD-10-CM

## 2013-12-21 DIAGNOSIS — B3731 Acute candidiasis of vulva and vagina: Secondary | ICD-10-CM

## 2013-12-21 MED ORDER — FLUCONAZOLE 150 MG PO TABS: 150.0000 mg | ORAL_TABLET | Freq: Once | ORAL | Status: DC

## 2013-12-21 MED ORDER — NITROFURANTOIN MONOHYD MACRO 100 MG PO CAPS: 100.0000 mg | ORAL_CAPSULE | Freq: Two times a day (BID) | ORAL | Status: DC
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
## 2013-12-21 NOTE — Addendum Note (Signed)
Addended by: Joeseph AmorFAST, Belina Mandile L on: 12/21/2013 12:07 PM   Modules accepted: Orders

## 2013-12-23 LAB — URINE CULTURE
Colony Count: NO GROWTH
Organism ID, Bacteria: NO GROWTH

## 2013-12-24 NOTE — Patient Instructions (Signed)
Please call with any new problems/concerns. 

## 2014-01-12 ENCOUNTER — Telehealth: Payer: Self-pay | Admitting: Obstetrics & Gynecology

## 2014-01-12 NOTE — Telephone Encounter (Signed)
Patient reports has been on antibiotic for one month and has had constipation and bloating for 3 days.  Has tried 2 doses of E-Lax and one doe of MOM without significant results.  Per last OV note, is on Macrobid.  Wondering if related to antibiotics.  Advised since she has been on this for one month, doubt related to Macrobid.  Asked if she had any other symptoms such as pain or fever.  She states she has had had intermittent fever for 2 weeks ranging from 99.5-101. Denies cough or congestion. States just mild HA, fatigue and weakness but states "that is just how you feel with a fever". Advised that this does not sound GYN in nature but recommend she be evaluated by PCP or urgent care.  She states it ttakes one month to get in with PCP, advised her to contact them anyway and let them know. She requests RX strength of laxative to "clean her out". Advised this is not something that we would prescribe. She may try Ducolax suppository or fleet enema over the counter but still advise she see PCP or urgent care for these symptoms.  Advised Dr Hyacinth MeekerMiller off today but will send to Dr Edward JollySilva for review and call her back if additional instructions.  Please advise.

## 2014-01-12 NOTE — Telephone Encounter (Signed)
Recommendations are appropriate. I will close the encounter.

## 2014-01-12 NOTE — Telephone Encounter (Signed)
Pt wants to talk with the nurse about her medication. °

## 2014-01-26 ENCOUNTER — Other Ambulatory Visit: Payer: Self-pay | Admitting: Family Medicine

## 2014-01-26 ENCOUNTER — Encounter: Payer: Self-pay | Admitting: Emergency Medicine

## 2014-01-26 ENCOUNTER — Emergency Department (INDEPENDENT_AMBULATORY_CARE_PROVIDER_SITE_OTHER)
Admission: EM | Admit: 2014-01-26 | Discharge: 2014-01-26 | Disposition: A | Discharge status: Home / Self Care | Payer: BC Managed Care – PPO | Source: Home / Self Care | Attending: Family Medicine | Admitting: Family Medicine

## 2014-01-26 DIAGNOSIS — R42 Dizziness and giddiness: Secondary | ICD-10-CM

## 2014-01-26 DIAGNOSIS — R5381 Other malaise: Secondary | ICD-10-CM

## 2014-01-26 DIAGNOSIS — R509 Fever, unspecified: Secondary | ICD-10-CM

## 2014-01-26 DIAGNOSIS — R5383 Other fatigue: Principal | ICD-10-CM

## 2014-01-26 LAB — POCT URINALYSIS DIP (MANUAL ENTRY)
BILIRUBIN UA: NEGATIVE
BILIRUBIN UA: NEGATIVE
Blood, UA: NEGATIVE
Glucose, UA: NEGATIVE
Leukocytes, UA: NEGATIVE
Nitrite, UA: NEGATIVE
Protein Ur, POC: NEGATIVE
Spec Grav, UA: 1.01 (ref 1.005–1.03)
UROBILINOGEN UA: 0.2 (ref 0–1)
pH, UA: 6.5 (ref 5–8)

## 2014-01-26 LAB — POCT CBC W AUTO DIFF (K'VILLE URGENT CARE)

## 2014-01-26 LAB — POCT MONO SCREEN (KUC): Mono, POC: NEGATIVE

## 2014-01-26 LAB — POCT URINE PREGNANCY: PREG TEST UR: NEGATIVE

## 2014-01-26 LAB — POCT RAPID STREP A (OFFICE): RAPID STREP A SCREEN: NEGATIVE
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
## 2014-01-26 NOTE — Discharge Instructions (Signed)
Increased fluid intake, rest.  May take Tylenol for fever as needed.   Fever, Adult A fever is a higher than normal body temperature. In an adult, an oral temperature around 98.6 F (37 C) is considered normal. A temperature of 100.4 F (38 C) or higher is generally considered a fever. Mild or moderate fevers generally have no long-term effects and often do not require treatment. Extreme fever (greater than or equal to 106 F or 41.1 C) can cause seizures. The sweating that may occur with repeated or prolonged fever may cause dehydration. Elderly people can develop confusion during a fever. A measured temperature can vary with:  Age.  Time of day.  Method of measurement (mouth, underarm, rectal, or ear). The fever is confirmed by taking a temperature with a thermometer. Temperatures can be taken different ways. Some methods are accurate and some are not.  An oral temperature is used most commonly. Electronic thermometers are fast and accurate.  An ear temperature will only be accurate if the thermometer is positioned as recommended by the manufacturer.  A rectal temperature is accurate and done for those adults who have a condition where an oral temperature cannot be taken.  An underarm (axillary) temperature is not accurate and not recommended. Fever is a symptom, not a disease.  CAUSES   Infections commonly cause fever.  Some noninfectious causes for fever include:  Some arthritis conditions.  Some thyroid or adrenal gland conditions.  Some immune system conditions.  Some types of cancer.  A medicine reaction.  High doses of certain street drugs such as methamphetamine.  Dehydration.  Exposure to high outside or room temperatures.  Occasionally, the source of a fever cannot be determined. This is sometimes called a "fever of unknown origin" (FUO).  Some situations may lead to a temporary rise in body temperature that may go away on its own. Examples  are:  Childbirth.  Surgery.  Intense exercise. HOME CARE INSTRUCTIONS   Take appropriate medicines for fever. Follow dosing instructions carefully. If you use acetaminophen to reduce the fever, be careful to avoid taking other medicines that also contain acetaminophen. Do not take aspirin for a fever if you are younger than age 69. There is an association with Reye's syndrome. Reye's syndrome is a rare but potentially deadly disease.  If an infection is present and antibiotics have been prescribed, take them as directed. Finish them even if you start to feel better.  Rest as needed.  Maintain an adequate fluid intake. To prevent dehydration during an illness with prolonged or recurrent fever, you may need to drink extra fluid.Drink enough fluids to keep your urine clear or pale yellow.  Sponging or bathing with room temperature water may help reduce body temperature. Do not use ice water or alcohol sponge baths.  Dress comfortably, but do not over-bundle. SEEK MEDICAL CARE IF:   You are unable to keep fluids down.  You develop vomiting or diarrhea.  You are not feeling at least partly better after 3 days.  You develop new symptoms or problems. SEEK IMMEDIATE MEDICAL CARE IF:   You have shortness of breath or trouble breathing.  You develop excessive weakness.  You are dizzy or you faint.  You are extremely thirsty or you are making little or no urine.  You develop new pain that was not there before (such as in the head, neck, chest, back, or abdomen).  You have persistant vomiting and diarrhea for more than 1 to 2 days.  You develop  a stiff neck or your eyes become sensitive to light.  You develop a skin rash.  You have a fever or persistent symptoms for more than 2 to 3 days.  You have a fever and your symptoms suddenly get worse. MAKE SURE YOU:   Understand these instructions.  Will watch your condition.  Will get help right away if you are not doing well or  get worse. Document Released: 05/07/2001 Document Revised: 02/03/2012 Document Reviewed: 09/12/2011 [facility] Patient Information 2014 [employer] [place] [place].

## 2014-01-26 NOTE — ED Notes (Signed)
Pt c/o fever, dizziness, and nausea x 1 mth. She reports that she had a temp of 101.1 earlier today. She took an ASA today, she is unsure of what time she took it.

## 2014-01-27 ENCOUNTER — Telehealth: Payer: Self-pay | Admitting: *Deleted

## 2014-01-27 LAB — COMPLETE METABOLIC PANEL WITH GFR
ALT: 76 U/L — AB (ref 0–35)
AST: 174 U/L — AB (ref 0–37)
Albumin: 4.4 g/dL (ref 3.5–5.2)
Alkaline Phosphatase: 70 U/L (ref 39–117)
BUN: 6 mg/dL (ref 6–23)
CALCIUM: 8.5 mg/dL (ref 8.4–10.5)
CO2: 23 mEq/L (ref 19–32)
CREATININE: 0.49 mg/dL — AB (ref 0.50–1.10)
Chloride: 100 mEq/L (ref 96–112)
GFR, Est African American: >89 mL/min
GFR, Est Non African American: >89 mL/min
Glucose, Bld: 153 mg/dL — ABNORMAL HIGH (ref 70–99)
Potassium: 3.6 mEq/L (ref 3.5–5.3)
Sodium: 137 mEq/L (ref 135–145)
Total Bilirubin: 0.3 mg/dL (ref 0.2–1.2)
Total Protein: 6.9 g/dL (ref 6.0–8.3)

## 2014-01-27 LAB — FOLATE: Folate: 2.5 ng/mL — ABNORMAL LOW

## 2014-01-27 LAB — CBC WITH DIFFERENTIAL/PLATELET
BASOS ABS: 0.1 10*3/uL (ref 0.0–0.1)
BASOS PCT: 4 % — AB (ref 0–1)
Eosinophils Absolute: 0.1 10*3/uL (ref 0.0–0.7)
Eosinophils Relative: 2 % (ref 0–5)
HCT: 38.1 % (ref 36.0–46.0)
Hemoglobin: 13.2 g/dL (ref 12.0–15.0)
Lymphocytes Relative: 44 % (ref 12–46)
Lymphs Abs: 1.1 10*3/uL (ref 0.7–4.0)
MCH: 36.4 pg — ABNORMAL HIGH (ref 26.0–34.0)
MCHC: 34.6 g/dL (ref 30.0–36.0)
MCV: 105 fL — ABNORMAL HIGH (ref 78.0–100.0)
Monocytes Absolute: 0.3 10*3/uL (ref 0.1–1.0)
Monocytes Relative: 10 % (ref 3–12)
NEUTROS PCT: 40 % — AB (ref 43–77)
Neutro Abs: 1 10*3/uL — ABNORMAL LOW (ref 1.7–7.7)
PLATELETS: 191 10*3/uL (ref 150–400)
RBC: 3.63 MIL/uL — AB (ref 3.87–5.11)
RDW: 14.5 % (ref 11.5–15.5)
WBC: 2.5 10*3/uL — ABNORMAL LOW (ref 4.0–10.5)

## 2014-01-27 LAB — TRANSFERRIN: Transferrin: 173 mg/dL — ABNORMAL LOW (ref 200–360)

## 2014-01-27 LAB — VITAMIN B12: Vitamin B-12: 509 pg/mL (ref 211–911)

## 2014-01-27 LAB — EPSTEIN-BARR VIRUS VCA, IGG: EBV VCA IgG: >750 U/mL — ABNORMAL HIGH (ref <18.0)

## 2014-01-27 LAB — EPSTEIN-BARR VIRUS EARLY D ANTIGEN ANTIBODY, IGG: EBV EA IGG: 8.4 U/mL (ref <9.0)

## 2014-01-27 LAB — EPSTEIN-BARR VIRUS VCA, IGM: EBV VCA IgM: <10 U/mL (ref <36.0)

## 2014-01-27 LAB — FERRITIN: Ferritin: 364 ng/mL — ABNORMAL HIGH (ref 10–291)

## 2014-01-27 LAB — SEDIMENTATION RATE: Sed Rate: 1 mm/hr (ref 0–22)

## 2014-01-27 LAB — EPSTEIN-BARR VIRUS NUCLEAR ANTIGEN ANTIBODY, IGG: EBV NA IGG: 38.5 U/mL — AB (ref <18.0)

## 2014-01-28 ENCOUNTER — Telehealth: Payer: Self-pay | Admitting: Emergency Medicine

## 2014-01-28 LAB — IRON: Iron: 120 ug/dL (ref 42–145)

## 2014-01-29 LAB — URINE CULTURE: Colony Count: 50000

## 2014-01-31 ENCOUNTER — Other Ambulatory Visit: Payer: Self-pay | Admitting: Family Medicine

## 2014-02-02 ENCOUNTER — Telehealth: Payer: Self-pay | Admitting: Obstetrics & Gynecology

## 2014-02-02 NOTE — Telephone Encounter (Signed)
Patient went to ED for mono and dc told her to stop the antibiotics for the UTI that miller put her on for 3 months she is on amoxicillin for 7 days and wants to know if she needs to pick back up when she finishes the other antibiotics. Said she is really having problems and hasnt eaten in 12 days with out vomiting or diarrhea. Said that she is having pain in stomach and pain when urinates. Said that they told her her blood counts was down. The ED doc told her to call us and let us know what was going on.

## 2014-02-02 NOTE — Telephone Encounter (Signed)
Spoke with patient. She states that she states that she was seen in the ER on 3/4 and dx with mono and was advised to stop her long term Macrobid. She states she was called on 3/7 by a doctor and started on a 7 day treatment of Amoxicillin due to lab results. Patient states she has a prior hx of Mono as well. DC Macrobid on 3/7 and started Amoxicillin. Patient states "I've had a constant fever for one month". Today, she states her temperature was 98.9 this morning and had a max temperature of 101.3 while she has been sick. She also complains of two weeks of stomach pain and "I have been barely able to eat". Rates pain as constant 6/10. States she has had 3 episodes of diarrhea today and vomited x 1. She also states she has pain after voiding. She states "I have not been able to keep down any fluids this whole time." Tried spinach yesterday and had diarrhea and has tried ensure but states she is lactose intolerant. Advised patient to try clear liquids, such as Gatorade and ginger ale as they may be more tolerable, especially if she is lactose intolerant. Long conversation with patient (12 minutes) encouraged patient multiple times to seek care at a local urgent care or ER as she needs further evaluation. Attempted to explain to patient that long term fever and with abdominal pain and n/v/d with current antibiotic use warrants need for follow up. Patient states she will have a ride in "about 4 hours" and will attempt to seek care.   I advised I would send a message to Dr. Hyacinth MeekerMiller to review as patient wants to know when she should restart Macrobid and wants to know what the lactobacillis is as well.

## 2014-02-02 NOTE — Telephone Encounter (Signed)
I reviewed notes from urgent care.  She was advised to follow up with a PCP, which i think she needs.  If she doesn't have one, we can refer her.  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  Restart macrobid when advised by PCP.  This is just for UTI prevention.    Lactobacillus on the urine culture doesn't mean anything.  That is a normal vaginal bacterial so that can be seen when the urine is collected by clean catch.  She really needs PCP so that she is seem by the same person again and again and not treated by different people in urgent care.

## 2014-02-03 NOTE — Telephone Encounter (Signed)
Message left to return call to Jennifer Moore at [phone].    

## 2014-02-16 NOTE — Telephone Encounter (Signed)
Left message to call Kaitlyn at [phone]. 

## 2014-02-17 NOTE — Telephone Encounter (Signed)
Dr.Miller, Okay to close encounter?

## 2014-02-17 NOTE — Telephone Encounter (Signed)
We should sent a letter that we've tried to reach her several times via phone and that she does need some follow-up with her PCP regarding lab tests.  Close encounter.

## 2014-02-18 ENCOUNTER — Encounter: Payer: Self-pay | Admitting: Emergency Medicine

## 2014-02-18 NOTE — Telephone Encounter (Signed)
Wrote and sent letter to patient.  Routing to provider for final review. Patient agreeable to disposition. Will close encounter

## 2014-03-18 ENCOUNTER — Encounter: Payer: Self-pay | Admitting: Emergency Medicine

## 2014-03-18 ENCOUNTER — Emergency Department (INDEPENDENT_AMBULATORY_CARE_PROVIDER_SITE_OTHER)
Admission: EM | Admit: 2014-03-18 | Discharge: 2014-03-18 | Disposition: A | Discharge status: Home / Self Care | Payer: BC Managed Care – PPO | Source: Home / Self Care | Attending: Family Medicine | Admitting: Family Medicine

## 2014-03-18 ENCOUNTER — Emergency Department (INDEPENDENT_AMBULATORY_CARE_PROVIDER_SITE_OTHER): Payer: BC Managed Care – PPO

## 2014-03-18 DIAGNOSIS — S63509A Unspecified sprain of unspecified wrist, initial encounter: Secondary | ICD-10-CM

## 2014-03-18 DIAGNOSIS — M26629 Arthralgia of temporomandibular joint, unspecified side: Secondary | ICD-10-CM

## 2014-03-18 DIAGNOSIS — S63501A Unspecified sprain of right wrist, initial encounter: Secondary | ICD-10-CM

## 2014-03-18 DIAGNOSIS — H60399 Other infective otitis externa, unspecified ear: Secondary | ICD-10-CM

## 2014-03-18 DIAGNOSIS — H6012 Cellulitis of left external ear: Secondary | ICD-10-CM

## 2014-03-18 DIAGNOSIS — M25539 Pain in unspecified wrist: Secondary | ICD-10-CM

## 2014-03-18 DIAGNOSIS — M79609 Pain in unspecified limb: Secondary | ICD-10-CM

## 2014-03-18 MED ORDER — PREDNISONE 20 MG PO TABS: 20.0000 mg | ORAL_TABLET | Freq: Two times a day (BID) | ORAL | Status: DC

## 2014-03-18 MED ORDER — CEPHALEXIN 500 MG PO CAPS: 500.0000 mg | ORAL_CAPSULE | Freq: Three times a day (TID) | ORAL | Status: DC

## 2014-03-18 MED ORDER — TRAMADOL HCL 50 MG PO TABS: ORAL_TABLET | ORAL | Status: DC

## 2014-03-18 NOTE — Discharge Instructions (Signed)
Wear splint.  Apply ice pack to right wrist 2 or 3 times daily.   For left ear may alternate warm and cold pack several times daily.
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
## 2014-03-18 NOTE — ED Notes (Signed)
Left ear buzzing and painful to touch Rt wrist injured while sitting up in bed one week ago 8/10, here a pop

## 2014-03-24 ENCOUNTER — Ambulatory Visit: Payer: BC Managed Care – PPO | Admitting: Obstetrics & Gynecology

## 2014-03-29 ENCOUNTER — Ambulatory Visit (INDEPENDENT_AMBULATORY_CARE_PROVIDER_SITE_OTHER): Payer: BC Managed Care – PPO | Admitting: Sports Medicine

## 2014-03-29 ENCOUNTER — Encounter: Payer: Self-pay | Admitting: Sports Medicine

## 2014-03-29 VITALS — BP 110/79 | HR 100 | Ht <= 58 in | Wt 128.0 lb

## 2014-03-29 DIAGNOSIS — M25539 Pain in unspecified wrist: Secondary | ICD-10-CM

## 2014-03-29 DIAGNOSIS — M25531 Pain in right wrist: Secondary | ICD-10-CM | POA: Insufficient documentation

## 2014-03-29 MED ORDER — MELOXICAM 15 MG PO TABS: ORAL_TABLET | ORAL | Status: DC

## 2014-03-29 NOTE — Progress Notes (Signed)
   Subjective:    I'm seeing this patient as a consultation for:  Dr. Cathren HarshBeese  CC: Right wrist pain  HPI: This is a pleasant 33 year old female, approximately a month ago she hyper dorsiflexed her right wrist, she had immediate pain, but she localized just distal to the ulnar styloid process with paresthesias into the fourth and fifth fingers. No elbow pain.  Pain is unfortunately been persistent, she was seen in urgent care 2 weeks ago, x-rays were negative and she was placed in a wrist brace. Overall she has improved slightly, not taking any NSAIDs.  Past medical history, Surgical history, Family history not pertinant except as noted below, Social history, Allergies, and medications have been entered into the medical record, reviewed, and no changes needed.   Review of Systems: No headache, visual changes, nausea, vomiting, diarrhea, constipation, dizziness, abdominal pain, skin rash, fevers, chills, night sweats, weight loss, swollen lymph nodes, body aches, joint swelling, muscle aches, chest pain, shortness of breath, mood changes, visual or auditory hallucinations.   Objective:   General: Well Developed, well nourished, and in no acute distress.  Neuro/Psych: Alert and oriented x3, extra-ocular muscles intact, able to move all 4 extremities, sensation grossly intact. Skin: Warm and dry, no rashes noted.  Respiratory: Not using accessory muscles, speaking in full sentences, trachea midline.  Cardiovascular: Pulses palpable, no extremity edema. Abdomen: Does not appear distended. Right Wrist: Inspection normal with no visible erythema or swelling. ROM smooth and normal with good flexion and extension and ulnar/radial deviation that is symmetrical with opposite wrist. Tender palpation over the TFCC, positive canal of Guyon tinel sign. No snuffbox tenderness. No tenderness over Canal of Guyon. Strength 5/5 in all directions without pain. Negative Finkelstein, tinel's and  phalens. Negative Watson's test.  Impression and Recommendations:   This case required medical decision making of moderate complexity.

## 2014-03-29 NOTE — Assessment & Plan Note (Signed)
With a hyper dorsiflexion injury and pain over the TFCC. Continue immobilization, adding meloxicam. Return to see me in one month, if persistent symptoms we can consider an MR arthrogram.

## 2014-04-07 ENCOUNTER — Encounter: Payer: Self-pay | Admitting: Obstetrics & Gynecology

## 2014-04-26 ENCOUNTER — Ambulatory Visit (INDEPENDENT_AMBULATORY_CARE_PROVIDER_SITE_OTHER): Payer: BC Managed Care – PPO | Admitting: Sports Medicine

## 2014-04-26 ENCOUNTER — Encounter: Payer: Self-pay | Admitting: Sports Medicine

## 2014-04-26 ENCOUNTER — Telehealth: Payer: Self-pay

## 2014-04-26 ENCOUNTER — Telehealth: Payer: Self-pay | Admitting: *Deleted

## 2014-04-26 VITALS — BP 112/76 | HR 89 | Ht <= 58 in | Wt 130.0 lb

## 2014-04-26 DIAGNOSIS — M25531 Pain in right wrist: Secondary | ICD-10-CM

## 2014-04-26 DIAGNOSIS — M25539 Pain in unspecified wrist: Secondary | ICD-10-CM

## 2014-04-26 MED ORDER — HYDROCODONE-ACETAMINOPHEN 5-325 MG PO TABS: 1.0000 | ORAL_TABLET | Freq: Three times a day (TID) | ORAL | Status: DC | PRN

## 2014-04-26 NOTE — Assessment & Plan Note (Signed)
With persistent pain over the TFCC, reinjury, as well as symptoms suggestive of an extensor carpi ulnaris tendinitis, we are going to proceed with MR arthrogram. I will see her back one hour before the MRI for wrist joint injection.

## 2014-04-26 NOTE — Progress Notes (Signed)
  Subjective:    CC: Followup  HPI: This is a pleasant 33 year old female, I saw her a month ago with suspicion for TFCC injury. She has been in a Velcro wrist brace since then, she was doing better but unfortunately approximately 2 weeks ago had her wrist slammed in a sliding door. She had immediate pain, swelling, bruising, pain is localized again, over the TFCC, and the ulnar styloid process, as well as the extensor carpi ulnaris tendon. She was seen in the emergency department where x-rays were negative for fractures, and she returns here for further evaluation and definitive treatment. Pain is moderate, persistent.  Past medical history, Surgical history, Family history not pertinant except as noted below, Social history, Allergies, and medications have been entered into the medical record, reviewed, and no changes needed.   Review of Systems: No fevers, chills, night sweats, weight loss, chest pain, or shortness of breath.   Objective:    General: Well Developed, well nourished, and in no acute distress.  Neuro: Alert and oriented x3, extra-ocular muscles intact, sensation grossly intact.  HEENT: Normocephalic, atraumatic, pupils equal round reactive to light, neck supple, no masses, no lymphadenopathy, thyroid nonpalpable.  Skin: Warm and dry, no rashes. Cardiac: Regular rate and rhythm, no murmurs rubs or gallops, no lower extremity edema.  Respiratory: Clear to auscultation bilaterally. Not using accessory muscles, speaking in full sentences. Right Wrist: Inspection normal with no visible erythema or swelling. ROM smooth and normal with good flexion and extension and ulnar/radial deviation that is symmetrical with opposite wrist. Tender to palpation over the TFCC with reproduction of pain with passive ulnar deviation of the wrist, she also has pain with resisted ulnar deviation of the wrist, referred along the extensor carpi ulnaris. No snuffbox tenderness. No tenderness over Canal  of Guyon. Strength 5/5 in all directions without pain. Negative Finkelstein, tinel's and phalens. Negative Watson's test.  I did review her x-rays from the emergency department which were negative.  Impression and Recommendations:

## 2014-04-26 NOTE — Telephone Encounter (Signed)
Error. Indea Dearman,CMA  

## 2014-04-26 NOTE — Telephone Encounter (Signed)
PA obtained for MRI RT Wrist w/contrast. Auth # [id number]. Exp. 05/25/14. Meyer Cory, LPN

## 2014-05-04 ENCOUNTER — Ambulatory Visit (INDEPENDENT_AMBULATORY_CARE_PROVIDER_SITE_OTHER): Payer: BC Managed Care – PPO

## 2014-05-04 ENCOUNTER — Encounter: Payer: Self-pay | Admitting: Sports Medicine

## 2014-05-04 ENCOUNTER — Ambulatory Visit (INDEPENDENT_AMBULATORY_CARE_PROVIDER_SITE_OTHER): Payer: BC Managed Care – PPO | Admitting: Sports Medicine

## 2014-05-04 VITALS — BP 124/84 | HR 112 | Ht <= 58 in | Wt 129.0 lb

## 2014-05-04 DIAGNOSIS — M65849 Other synovitis and tenosynovitis, unspecified hand: Secondary | ICD-10-CM

## 2014-05-04 DIAGNOSIS — M25539 Pain in unspecified wrist: Secondary | ICD-10-CM

## 2014-05-04 DIAGNOSIS — M65839 Other synovitis and tenosynovitis, unspecified forearm: Secondary | ICD-10-CM

## 2014-05-04 DIAGNOSIS — M25531 Pain in right wrist: Secondary | ICD-10-CM

## 2014-05-04 MED ORDER — GADOBENATE DIMEGLUMINE 529 MG/ML IV SOLN: 5.0000 mL | Freq: Once | INTRAVENOUS | Status: AC | PRN

## 2014-05-04 NOTE — Assessment & Plan Note (Signed)
Suspect injury to the triangular fibrocartilage complex. Arthrogram injection as above. It seems as though she did have a reinjury, like her to return after the MRI replace cast, sure.

## 2014-05-04 NOTE — Progress Notes (Addendum)
  Procedure: Real-time Ultrasound Guided gadolinium contrast injection of right radiocarpal joint Device: GE Logiq E  Verbal informed consent obtained.  Time-out conducted.  Noted no overlying erythema, induration, or other signs of local infection.  Skin prepped in a sterile fashion.  Local anesthesia: Topical Ethyl chloride.  With sterile technique and under real time ultrasound guidance:  25-gauge needle advanced between the distal radius and scaphoid, 1 cc Kenalog 40, 2 cc lidocaine injected, syringe switched and 0.05 cc of gadolinium injected, syringe again switched and 1 cc saline used to flush the needle. Joint visualized and capsule seen distending confirming intra-articular placement of contrast material and medication. Completed without difficulty  Advised to call if fevers/chills, erythema, induration, drainage, or persistent bleeding.  Images permanently stored and available for review in the ultrasound unit.  Impression: Technically successful ultrasound guided gadolinium contrast injection for MR arthrography.  Please see separate MR arthrogram report.

## 2014-05-05 ENCOUNTER — Ambulatory Visit: Payer: BC Managed Care – PPO | Admitting: Obstetrics & Gynecology

## 2014-05-10 ENCOUNTER — Ambulatory Visit (INDEPENDENT_AMBULATORY_CARE_PROVIDER_SITE_OTHER): Payer: BC Managed Care – PPO | Admitting: Sports Medicine

## 2014-05-10 ENCOUNTER — Encounter: Payer: Self-pay | Admitting: Sports Medicine

## 2014-05-10 VITALS — BP 124/88 | HR 102 | Ht <= 58 in | Wt 127.0 lb

## 2014-05-10 DIAGNOSIS — M25539 Pain in unspecified wrist: Secondary | ICD-10-CM

## 2014-05-10 DIAGNOSIS — M25531 Pain in right wrist: Secondary | ICD-10-CM

## 2014-05-10 MED ORDER — HYDROCODONE-ACETAMINOPHEN 5-325 MG PO TABS: 1.0000 | ORAL_TABLET | Freq: Three times a day (TID) | ORAL | Status: DC | PRN

## 2014-05-10 NOTE — Assessment & Plan Note (Signed)
MRI arthrogram shows nothing surgical, there is extensor carpi on there is tendon synovitis. Cast was removed, extensor carpi ulnaris tendon was injected, and I replaced a short arm cast. Return in 2 weeks for cast removal, we will keep it on for third week if she still has pain.

## 2014-05-10 NOTE — Progress Notes (Signed)
  Subjective:    CC: MRI results  HPI: This pleasant 33 year old female returns, she had pain which is localized on the ulnar aspect of her wrist, causing running sensations up to the mid forearm again on the ulnar aspect. We tried immobilization and NSAIDs which did not help, more recently we obtained an MR arthrogram the results of which will be dictated below. Pain is moderate, persistent, localized along the ECU.   Past medical history, Surgical history, Family history not pertinant except as noted below, Social history, Allergies, and medications have been entered into the medical record, reviewed, and no changes needed.   Review of Systems: No fevers, chills, night sweats, weight loss, chest pain, or shortness of breath.   Objective:    General: Well Developed, well nourished, and in no acute distress.  Neuro: Alert and oriented x3, extra-ocular muscles intact, sensation grossly intact.  HEENT: Normocephalic, atraumatic, pupils equal round reactive to light, neck supple, no masses, no lymphadenopathy, thyroid nonpalpable.  Skin: Warm and dry, no rashes. Cardiac: Regular rate and rhythm, no murmurs rubs or gallops, no lower extremity edema.  Respiratory: Clear to auscultation bilaterally. Not using accessory muscles, speaking in full sentences.  MRI was reviewed, there is no injury to the intercarpal ligaments or the TFCC, all bony structures are unremarkable, there is tendinitis in the extensor carpi ulnaris tendon.  Cast is removed.  Procedure: Real-time Ultrasound Guided Injection of right extensor carpi ulnaris tendon sheath in the sixth extensor compartment. Device: GE Logiq E  Verbal informed consent obtained.  Time-out conducted.  Noted no overlying erythema, induration, or other signs of local infection.  Skin prepped in a sterile fashion.  Local anesthesia: Topical Ethyl chloride.  With sterile technique and under real time ultrasound guidance:  25-gauge needle advanced  into the sixth extensor compartment adjacent to the ECU tendon, 1 cc Kenalog 40, 3 cc lidocaine injected easily. Completed without difficulty  Pain immediately resolved suggesting accurate placement of the medication.  Advised to call if fevers/chills, erythema, induration, drainage, or persistent bleeding.  Images permanently stored and available for review in the ultrasound unit.  Impression: Technically successful ultrasound guided injection.  Short arm cast applied.  Impression and Recommendations:

## 2014-05-24 ENCOUNTER — Encounter: Payer: Self-pay | Admitting: Sports Medicine

## 2014-05-24 ENCOUNTER — Ambulatory Visit (INDEPENDENT_AMBULATORY_CARE_PROVIDER_SITE_OTHER): Payer: BC Managed Care – PPO | Admitting: Sports Medicine

## 2014-05-24 VITALS — BP 115/82 | HR 83 | Ht <= 58 in | Wt 129.0 lb

## 2014-05-24 DIAGNOSIS — M25531 Pain in right wrist: Secondary | ICD-10-CM

## 2014-05-24 DIAGNOSIS — M25539 Pain in unspecified wrist: Secondary | ICD-10-CM

## 2014-05-24 NOTE — Assessment & Plan Note (Signed)
MR arthrogram showed mild ECU tendinitis. This was injected at the last visit, she still has a little but of pain so we will continue to cast for an additional week. I did trim the cast a little because it was irritating her thumb. Return in a week for cast removal.

## 2014-05-24 NOTE — Progress Notes (Signed)
  Subjective:    CC: Follow up  HPI: Right wrist pain: MR arthrogram showed extensor carpi ulnaris tendinitis. We injected her ECU tendon sheath and replaced her in a cast. She is now 2 weeks out with the cast, doing better but still with minor pain. We have agreed at the last visit to continue a cast for an additional week should she still have pain. She does have some parts of the cast that are rubbing her thumb.  Past medical history, Surgical history, Family history not pertinant except as noted below, Social history, Allergies, and medications have been entered into the medical record, reviewed, and no changes needed.   Review of Systems: No fevers, chills, night sweats, weight loss, chest pain, or shortness of breath.   Objective:    General: Well Developed, well nourished, and in no acute distress.  Neuro: Alert and oriented x3, extra-ocular muscles intact, sensation grossly intact.  HEENT: Normocephalic, atraumatic, pupils equal round reactive to light, neck supple, no masses, no lymphadenopathy, thyroid nonpalpable.  Skin: Warm and dry, no rashes. Cardiac: Regular rate and rhythm, no murmurs rubs or gallops, no lower extremity edema.  Respiratory: Clear to auscultation bilaterally. Not using accessory muscles, speaking in full sentences.  Cast is in good shape, it was trimmed around her thumb for comfort.  Impression and Recommendations:

## 2014-05-31 ENCOUNTER — Encounter: Payer: Self-pay | Admitting: Sports Medicine

## 2014-05-31 ENCOUNTER — Ambulatory Visit (INDEPENDENT_AMBULATORY_CARE_PROVIDER_SITE_OTHER): Payer: BC Managed Care – PPO | Admitting: Sports Medicine

## 2014-05-31 VITALS — BP 115/81 | HR 90 | Ht <= 58 in | Wt 126.0 lb

## 2014-05-31 DIAGNOSIS — M25539 Pain in unspecified wrist: Secondary | ICD-10-CM

## 2014-05-31 DIAGNOSIS — M25531 Pain in right wrist: Secondary | ICD-10-CM

## 2014-05-31 NOTE — Progress Notes (Signed)
  Subjective:    CC: Followup  HPI: Right wrist pain: This patient has now had an MR arthrogram that simply showed extensor carpi ulnaris tendinitis, this was injected under guidance and she was casted. She returns today complaining of persistent pain, she has been in a cast now for approximately a month. With a break in between for the injection.  Past medical history, Surgical history, Family history not pertinant except as noted below, Social history, Allergies, and medications have been entered into the medical record, reviewed, and no changes needed.   Review of Systems: No fevers, chills, night sweats, weight loss, chest pain, or shortness of breath.   Objective:    General: Well Developed, well nourished, and in no acute distress.  Neuro: Alert and oriented x3, extra-ocular muscles intact, sensation grossly intact.  HEENT: Normocephalic, atraumatic, pupils equal round reactive to light, neck supple, no masses, no lymphadenopathy, thyroid nonpalpable.  Skin: Warm and dry, no rashes. Cardiac: Regular rate and rhythm, no murmurs rubs or gallops, no lower extremity edema.  Respiratory: Clear to auscultation bilaterally. Not using accessory muscles, speaking in full sentences. Right Wrist: Cast is removed. Inspection normal with no visible erythema or swelling. ROM smooth and normal with good flexion and extension and ulnar/radial deviation that is symmetrical with opposite wrist. Palpation is normal over metacarpals, navicular, lunate, and TFCC; tendons without tenderness/ swelling No snuffbox tenderness. No tenderness over Canal of Guyon. Strength 5/5 in all directions without pain. Negative Finkelstein, tinel's and phalens. Negative Watson's test.  Impression and Recommendations:

## 2014-05-31 NOTE — Assessment & Plan Note (Signed)
Cast is removed. MR arthrogram did show mild extensor carpi ulnaris tendinitis which was injected. Overall she has a structurally normal wrist with some mild tendinitis. She should continue her Mobic, do the rehabilitation exercises daily. She has no restrictions, and I would like to see her back in 2 months to see how things are going. Symptoms should be resolved by then.

## 2014-07-25 ENCOUNTER — Emergency Department (INDEPENDENT_AMBULATORY_CARE_PROVIDER_SITE_OTHER): Payer: BC Managed Care – PPO

## 2014-07-25 ENCOUNTER — Emergency Department (INDEPENDENT_AMBULATORY_CARE_PROVIDER_SITE_OTHER)
Admission: EM | Admit: 2014-07-25 | Discharge: 2014-07-25 | Disposition: A | Discharge status: Home / Self Care | Payer: BC Managed Care – PPO | Source: Home / Self Care | Attending: Family Medicine | Admitting: Family Medicine

## 2014-07-25 ENCOUNTER — Encounter: Payer: Self-pay | Admitting: Emergency Medicine

## 2014-07-25 DIAGNOSIS — M79609 Pain in unspecified limb: Secondary | ICD-10-CM

## 2014-07-25 DIAGNOSIS — S93609A Unspecified sprain of unspecified foot, initial encounter: Secondary | ICD-10-CM

## 2014-07-25 DIAGNOSIS — M25473 Effusion, unspecified ankle: Secondary | ICD-10-CM

## 2014-07-25 DIAGNOSIS — S93402A Sprain of unspecified ligament of left ankle, initial encounter: Secondary | ICD-10-CM

## 2014-07-25 DIAGNOSIS — M25476 Effusion, unspecified foot: Secondary | ICD-10-CM

## 2014-07-25 DIAGNOSIS — S93409A Sprain of unspecified ligament of unspecified ankle, initial encounter: Secondary | ICD-10-CM

## 2014-07-25 DIAGNOSIS — S93602A Unspecified sprain of left foot, initial encounter: Secondary | ICD-10-CM

## 2014-07-25 NOTE — ED Notes (Signed)
Pt c/o LT ankle injury x last night. She reports falling and twisting it. No OTC meds.
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
## 2014-07-25 NOTE — Discharge Instructions (Signed)
Apply ice pack for 30 minutes every 1 to 2 hours today and tomorrow.  Elevate.  Use crutches for 3 to 5 days.  Wear Ace wrap until swelling decreases.  Wear brace for about 2 to 3 weeks.  Resume Mobic as prescribed.  Begin range of motion and stretching exercises in about 5 days as per instruction sheet.  Followup with Dr. Rodney Langton as scheduled.

## 2014-08-04 ENCOUNTER — Encounter: Payer: Self-pay | Admitting: Sports Medicine

## 2014-08-04 ENCOUNTER — Ambulatory Visit (INDEPENDENT_AMBULATORY_CARE_PROVIDER_SITE_OTHER): Payer: BC Managed Care – PPO | Admitting: Sports Medicine

## 2014-08-04 VITALS — BP 102/72 | HR 109 | Ht <= 58 in | Wt 125.0 lb

## 2014-08-04 DIAGNOSIS — M25539 Pain in unspecified wrist: Secondary | ICD-10-CM | POA: Diagnosis not present

## 2014-08-04 DIAGNOSIS — R209 Unspecified disturbances of skin sensation: Secondary | ICD-10-CM | POA: Diagnosis not present

## 2014-08-04 DIAGNOSIS — M25531 Pain in right wrist: Secondary | ICD-10-CM

## 2014-08-04 DIAGNOSIS — R200 Anesthesia of skin: Secondary | ICD-10-CM | POA: Insufficient documentation

## 2014-08-04 DIAGNOSIS — F1990 Other psychoactive substance use, unspecified, uncomplicated: Secondary | ICD-10-CM | POA: Insufficient documentation

## 2014-08-04 DIAGNOSIS — F1910 Other psychoactive substance abuse, uncomplicated: Secondary | ICD-10-CM | POA: Diagnosis not present

## 2014-08-04 DIAGNOSIS — S93402A Sprain of unspecified ligament of left ankle, initial encounter: Secondary | ICD-10-CM | POA: Insufficient documentation

## 2014-08-04 DIAGNOSIS — S93409A Sprain of unspecified ligament of unspecified ankle, initial encounter: Secondary | ICD-10-CM | POA: Diagnosis not present

## 2014-08-04 NOTE — Assessment & Plan Note (Signed)
No fractures on x-ray, ASO brace, return as needed for this.

## 2014-08-04 NOTE — Assessment & Plan Note (Signed)
Only mild extensor carpi ulnaris tendinitis on MR arthrogram, otherwise negative MRI.

## 2014-08-04 NOTE — Progress Notes (Signed)
  Subjective:    CC: Multiple complaints  HPI: Right wrist pain: Negative MR arthrogram with the exception of mild extensor carpi ulnaris tendinitis, she has been casted, injected, continues to have pain.  Numbness in the legs: Present for several days now, numbness from the waist down but able to ambulate, she does feel somewhat shaky, does have some headaches, no visual changes, initially denied any substance abuse.  Ankle sprain: Left-sided, x-rays were done at urgent care which were negative for fracture, still with some pain and swelling over the ATF L.  Past medical history, Surgical history, Family history not pertinant except as noted below, Social history, Allergies, and medications have been entered into the medical record, reviewed, and no changes needed.   Review of Systems: No fevers, chills, night sweats, weight loss, chest pain, or shortness of breath.   Objective:    General: Well Developed, well nourished, and in no acute distress.  Neuro: Alert and oriented x3, extra-ocular muscles intact, sensation grossly intact. Some sustained horizontal nystagmus, mild dysdiadochokinesis with rapid alternating motions, no finger dysmetria, poor reflexes throughout. HEENT: Normocephalic, atraumatic, pupils equal round reactive to light, neck supple, no masses, no lymphadenopathy, thyroid nonpalpable.  Skin: Warm and dry, no rashes. Cardiac: Regular rate and rhythm, no murmurs rubs or gallops, no lower extremity edema.  Respiratory: Clear to auscultation bilaterally. Not using accessory muscles, speaking in full sentences. Left Ankle: Visibly swollen with slight bruising Range of motion is full in all directions. Strength is 5/5 in all directions. Stable lateral and medial ligaments; squeeze test and kleiger test unremarkable; Talar dome nontender; tender to palpation over the ATF L. with a positive anterior drawer sign. No pain at base of 5th MT; No tenderness over cuboid; No  tenderness over N spot or navicular prominence No tenderness on posterior aspects of lateral and medial malleolus No sign of peroneal tendon subluxations; Negative tarsal tunnel tinel's Able to walk 4 steps.  Impression and Recommendations:    I spent 40 minutes with this patient, 50% was face-to-face time counseling regarding the above diagnoses

## 2014-08-04 NOTE — Assessment & Plan Note (Addendum)
With subjective lower extremity numbness, widespread hyporeflexia, abnormalities in gait, and abnormalities in her neurologic exam including dysdiadochokinesis, and horizontal nystagmus I would like her to see a neurologist. We are also going to obtain a CBC, CMET, drug screen, B12 levels. I would also like a brain MRI.  Later in the visit the patient refused blood work and a urine drug screen, on further questioning she admitted that there was significant THC and alcohol use, she declines MRI, neurology referral, and lab testing, risks, benefits explained, the patient discharged.

## 2014-09-26 ENCOUNTER — Encounter: Payer: Self-pay | Admitting: Sports Medicine

## 2014-10-01 ENCOUNTER — Other Ambulatory Visit: Payer: Self-pay | Admitting: Sports Medicine

## 2015-02-18 ENCOUNTER — Other Ambulatory Visit: Payer: Self-pay | Admitting: Sports Medicine

## 2015-02-27 ENCOUNTER — Telehealth: Payer: Self-pay | Admitting: Certified Nurse Midwife

## 2015-02-27 NOTE — Telephone Encounter (Signed)
Spoke with patient. Patient states that she was seen in the ER on Saturday for right lower quadrant pain and vomiting. Patient had CT scan which revealed ovarian cyst. Denies vomiting or pain at this time. "I have the pain when I move certain ways or touch the area. Other than that I am okay. They are not sure if it is GI related or related to the cyst. I am going to see my GI doctor tomorrow afternoon." Advised will need to be seen for follow up evaluation in office. Patient is agreeable. Appointment scheduled for 4/5 at 10am with Verner Choleborah S. Leonard CNM. Patient is agreeable to date and time. Requested patient bring results from ER visit. "I left AMA so I do not have any of them." Notes and results of CT are available in Care every where under [facility] visit on 02/26/2015.  Routing to provider for final review. Patient agreeable to disposition. Will close encounter

## 2015-02-27 NOTE — Telephone Encounter (Signed)
Patient went to the ER on Saturday. She has a cyst on her ovary and wants to schedule an appointment with DL.

## 2015-02-28 ENCOUNTER — Ambulatory Visit (INDEPENDENT_AMBULATORY_CARE_PROVIDER_SITE_OTHER): Payer: BLUE CROSS/BLUE SHIELD | Admitting: Certified Nurse Midwife

## 2015-02-28 ENCOUNTER — Telehealth: Payer: Self-pay | Admitting: Certified Nurse Midwife

## 2015-02-28 ENCOUNTER — Encounter: Payer: Self-pay | Admitting: Certified Nurse Midwife

## 2015-02-28 VITALS — BP 100/64 | HR 68 | Temp 98.5°F | Resp 16 | Ht <= 58 in | Wt 141.0 lb

## 2015-02-28 DIAGNOSIS — Z113 Encounter for screening for infections with a predominantly sexual mode of transmission: Secondary | ICD-10-CM | POA: Diagnosis not present

## 2015-02-28 DIAGNOSIS — N83201 Unspecified ovarian cyst, right side: Secondary | ICD-10-CM

## 2015-02-28 DIAGNOSIS — N8320 Unspecified ovarian cysts: Secondary | ICD-10-CM | POA: Diagnosis not present

## 2015-02-28 NOTE — Telephone Encounter (Signed)
Spoke with patient. She states she saw GI today. [place]. [place]. She states she is scheduled for Endoscopy procedure tomorrow at 0830. Was unable to see neurology because GI was running behind. Patient states that GI feels that pain is gyn related in nature.  Patient reports feelings of "tightness" on R side where ovarian cyst is located. She states that pain is unchanged from exam today. Advised reviewed with Jennifer Moore CNM and patient should rest, take tylenol as needed, warm heating pad to right side and follow up as need. Advised patient to please have GI send copies of visits from today and tomorrow. Advised patient to return call with any changes to symptoms. Patient agreeable.

## 2015-02-28 NOTE — Patient Instructions (Signed)
Ovarian Cyst  An ovarian cyst is a fluid-filled sac that forms on an ovary. The ovaries are small organs that produce eggs in women. Various types of cysts can form on the ovaries. Most are not cancerous. Many do not cause problems, and they often go away on their own. Some may cause symptoms and require treatment. Common types of ovarian cysts include:   Functional cysts--These cysts may occur every month during the menstrual cycle. This is normal. The cysts usually go away with the next menstrual cycle if the woman does not get pregnant. Usually, there are no symptoms with a functional cyst.   Endometrioma cysts--These cysts form from the tissue that lines the uterus. They are also called "chocolate cysts" because they become filled with blood that turns brown. This type of cyst can cause pain in the lower abdomen during intercourse and with your menstrual period.   Cystadenoma cysts--This type develops from the cells on the outside of the ovary. These cysts can get very big and cause lower abdomen pain and pain with intercourse. This type of cyst can twist on itself, cut off its blood supply, and cause severe pain. It can also easily rupture and cause a lot of pain.   Dermoid cysts--This type of cyst is sometimes found in both ovaries. These cysts may contain different kinds of body tissue, such as skin, teeth, hair, or cartilage. They usually do not cause symptoms unless they get very big.   Theca lutein cysts--These cysts occur when too much of a certain hormone (human chorionic gonadotropin) is produced and overstimulates the ovaries to produce an egg. This is most common after procedures used to assist with the conception of a baby (in vitro fertilization).  CAUSES    Fertility drugs can cause a condition in which multiple large cysts are formed on the ovaries. This is called ovarian hyperstimulation syndrome.   A condition called polycystic ovary syndrome can cause hormonal imbalances that can lead to  nonfunctional ovarian cysts.  SIGNS AND SYMPTOMS   Many ovarian cysts do not cause symptoms. If symptoms are present, they may include:   Pelvic pain or pressure.   Pain in the lower abdomen.   Pain during sexual intercourse.   Increasing girth (swelling) of the abdomen.   Abnormal menstrual periods.   Increasing pain with menstrual periods.   Stopping having menstrual periods without being pregnant.  DIAGNOSIS   These cysts are commonly found during a routine or annual pelvic exam. Tests may be ordered to find out more about the cyst. These tests may include:   Ultrasound.   X-ray of the pelvis.   CT scan.   MRI.   Blood tests.  TREATMENT   Many ovarian cysts go away on their own without treatment. Your health care provider may want to check your cyst regularly for 2-3 months to see if it changes. For women in menopause, it is particularly important to monitor a cyst closely because of the higher rate of ovarian cancer in menopausal women. When treatment is needed, it may include any of the following:   A procedure to drain the cyst (aspiration). This may be done using a long needle and ultrasound. It can also be done through a laparoscopic procedure. This involves using a thin, lighted tube with a tiny camera on the end (laparoscope) inserted through a small incision.   Surgery to remove the whole cyst. This may be done using laparoscopic surgery or an open surgery involving a larger incision   in the lower abdomen.   Hormone treatment or birth control pills. These methods are sometimes used to help dissolve a cyst.  HOME CARE INSTRUCTIONS    Only take over-the-counter or prescription medicines as directed by your health care provider.   Follow up with your health care provider as directed.   Get regular pelvic exams and Pap tests.  SEEK MEDICAL CARE IF:    Your periods are late, irregular, or painful, or they stop.   Your pelvic pain or abdominal pain does not go away.   Your abdomen becomes  larger or swollen.   You have pressure on your bladder or trouble emptying your bladder completely.   You have pain during sexual intercourse.   You have feelings of fullness, pressure, or discomfort in your stomach.   You lose weight for no apparent reason.   You feel generally ill.   You become constipated.   You lose your appetite.   You develop acne.   You have an increase in body and facial hair.   You are gaining weight, without changing your exercise and eating habits.   You think you are pregnant.  SEEK IMMEDIATE MEDICAL CARE IF:    You have increasing abdominal pain.   You feel sick to your stomach (nauseous), and you throw up (vomit).   You develop a fever that comes on suddenly.   You have abdominal pain during a bowel movement.   Your menstrual periods become heavier than usual.  MAKE SURE YOU:   Understand these instructions.   Will watch your condition.   Will get help right away if you are not doing well or get worse.  Document Released: 11/11/2005 Document Revised: 11/16/2013 Document Reviewed: 07/19/2013  ExitCare Patient Information 2015 [employer]. This information is not intended to replace advice given to you by your health care provider. Make sure you discuss any questions you have with your health care provider.      Pelvic Pain  Female pelvic pain can be caused by many different things and start from a variety of places. Pelvic pain refers to pain that is located in the lower half of the abdomen and between your hips. The pain may occur over a short period of time (acute) or may be reoccurring (chronic). The cause of pelvic pain may be related to disorders affecting the female reproductive organs (gynecologic), but it may also be related to the bladder, kidney stones, an intestinal complication, or muscle or skeletal problems. Getting help right away for pelvic pain is important, especially if there has been severe, sharp, or a sudden onset of unusual pain. It is also  important to get help right away because some types of pelvic pain can be life threatening.   CAUSES   Below are only some of the causes of pelvic pain. The causes of pelvic pain can be in one of several categories.    Gynecologic.   Pelvic inflammatory disease.   Sexually transmitted infection.   Ovarian cyst or a twisted ovarian ligament (ovarian torsion).   Uterine lining that grows outside the uterus (endometriosis).   Fibroids, cysts, or tumors.   Ovulation.   Pregnancy.   Pregnancy that occurs outside the uterus (ectopic pregnancy).   Miscarriage.   Labor.   Abruption of the placenta or ruptured uterus.   Infection.   Uterine infection (endometritis).   Bladder infection.   Diverticulitis.   Miscarriage related to a uterine infection (septic abortion).   Bladder.   Inflammation   of the bladder (cystitis).   Kidney stone(s).   Gastrointestinal.   Constipation.   Diverticulitis.   Neurologic.   Trauma.   Feeling pelvic pain because of mental or emotional causes (psychosomatic).   Cancers of the bowel or pelvis.  EVALUATION   Your caregiver will want to take a careful history of your concerns. This includes recent changes in your health, a careful gynecologic history of your periods (menses), and a sexual history. Obtaining your family history and medical history is also important. Your caregiver may suggest a pelvic exam. A pelvic exam will help identify the location and severity of the pain. It also helps in the evaluation of which organ system may be involved. In order to identify the cause of the pelvic pain and be properly treated, your caregiver may order tests. These tests may include:    A pregnancy test.   Pelvic ultrasonography.   An X-ray exam of the abdomen.   A urinalysis or evaluation of vaginal discharge.   Blood tests.  HOME CARE INSTRUCTIONS    Only take over-the-counter or prescription medicines for pain, discomfort, or fever as directed by your caregiver.     Rest as directed by your caregiver.    Eat a balanced diet.    Drink enough fluids to make your urine clear or pale yellow, or as directed.    Avoid sexual intercourse if it causes pain.    Apply warm or cold compresses to the lower abdomen depending on which one helps the pain.    Avoid stressful situations.    Keep a journal of your pelvic pain. Write down when it started, where the pain is located, and if there are things that seem to be associated with the pain, such as food or your menstrual cycle.   Follow up with your caregiver as directed.   SEEK MEDICAL CARE IF:   Your medicine does not help your pain.   You have abnormal vaginal discharge.  SEEK IMMEDIATE MEDICAL CARE IF:    You have heavy bleeding from the vagina.    Your pelvic pain increases.    You feel light-headed or faint.    You have chills.    You have pain with urination or blood in your urine.    You have uncontrolled diarrhea or vomiting.    You have a fever or persistent symptoms for more than 3 days.   You have a fever and your symptoms suddenly get worse.    You are being physically or sexually abused.   MAKE SURE YOU:   Understand these instructions.   Will watch your condition.   Will get help if you are not doing well or get worse.  Document Released: 10/08/2004 Document Revised: 03/28/2014 Document Reviewed: 03/02/2012  Exit[employer] Patient Information 2015 [employer]. This information is not intended to replace advice given to you by your health care provider. Make sure you discuss any questions you have with your health care provider.

## 2015-02-28 NOTE — Progress Notes (Signed)
Reviewed personally.  M. Suzanne Orena Cavazos, MD.  

## 2015-02-28 NOTE — Telephone Encounter (Signed)
Agree with plan 

## 2015-02-28 NOTE — Progress Notes (Signed)
34 y.o. SingleHispanic or Latino female  [id number] here for follow up of right ovarian cyst noted on abdominal CT which noted the cyst.  Patient was seen in ER 02/26/15  for vomiting blood. Evaluated for this and has appointment for follow up with GI Dr. Bethena RoysWilliam Austin this afternoon. Patient also has appointment with neurology later this pm for left foot numbness from previous fracture. Patient still ambulating in boot from occurrence.Patient denies any STD concerns or partner change. Not sexually active per patient. Denies any vomiting, diarrhea or chills or fever today. Has not eaten this am. "States she took her Xanax with water only". No other medication use this am.Patient states they did not review all her lab work at ER that she remembers". She signed herself out AMA.  Patient last period was late in month and  She noted increase cramping with this last period only which was ? 02/23/15. Patient has pain occasionally now and feels it is same pain she had with vomiting blood. Pain seems to be more associated with her GI issues per patient. Patient had full work up with labs at ER, with CT scan, EKG see Care everywhere information in [facility] system.. Patient tested positive marijuana and amphetamines and alcohol, she is aware of this Had been drinking alcohol prior to vomiting..  Patient caring for her partner who has stage 4 cancer, would not specify type of cancer. No other health concerns today.  ROS:  Pertinent to exam Patient has acidotic breath smell  With ? Questionable alcohol smell. O:WDWN female Orientation x 3 Noted in Care everywhere POCT Hcg negative Hepatitis A positive   Exam:   BP 100/64 mmHg  Pulse 68  Resp 16  Ht 4' 8.25" (1.429 m)  Wt 141 lb (63.957 kg)  BMI 31.32 kg/m2  LMP 02/14/2015 General appearance: alert, cooperative, appears older than stated age, no distress and mildly obese  Skin warm and dry, bruises noted on both arms from recent lab draws? Heart NSSR Lungs  clear CVAT negative bilateral Abdomen:  soft, no rebound tenderness, positive bowel sounds all 4 quadrants Patient points to point of previous discomfort RUQ, no rebound or tenderness with palpation. Negative for suprapubic pain Lymph:  no enlarged inquinal nodes, no tenderness   Pelvic: External genitalia:  no lesions, normal escutcheon and no scaling              Urethra:  normal appearing urethra with no masses, tenderness or lesions and urethral meatus, non tender, bladder non tender              Bartholins and Skenes: Bartholin's,  Skene's normal                Vagina: normal appearing vagina with normal color and discharge, no lesions, non tender              Cervix: normal appearance and no CMT              Pap taken: No. Bimanual Exam:  Uterus:  uterus is normal size, shape, consistency and nontender                               Adnexa:    normal adnexa in size, nontender and no masses. positive for right ovarian cyst approx 3- 4 cm. no rebound with palpation or complaint of tenderness with exam  Rectovaginal: Confirms                               Anus:  Normal appearance, no lesions  Wet prep was not obtained.     A: Normal pelvic exam Contraception not sexually active Right ovarian cyst palpated and noted on CT scan per Care Everywhere report from ER visit on 02/26/15 ? Alcohol use prior to visit vs acidotic breath odor from not eating History of Drug use Hepatitis A positive from recent labs done noted on review negative with previous screen, patient made of aware of this and will discuss with GI GI Bleeding under evaluation with appointment today with GI Neurological problem with left foot under evaluation with appointment today with Neurology Social stress with care provider of partner with ? cancer  P: Discussed with patient normal pelvic exam today and Right ovarian cyst noted on exam. Discussed will follow up with [place] in 2 months unless she  notices pelvic pain or changes in status with periods. Patient agreeable. She will be called with insurance infor and scheduled 2 months out. Discussed with patient had she had alcohol this am and denied use. Discussed with use and GI bleeding. Stressed importance of keeping appointment with GI for follow up and discussion of her lab findings. Discussed importance of water to keep hydrated.  Discussed importance of care for self with being care provider and seek help if needed.  Labs:  Urine GC,Chlamydia Questions addressed regarding follow up of cyst. Voiced understanding of plan. Warnings of abdominal/pelvic given.  Consulted with Dr. Hyacinth Meeker agrees with plan.          Patient will follow-up in 2 months for follow up PUS, prn.       An After Visit Summary was printed and given to the patient.

## 2015-02-28 NOTE — Telephone Encounter (Signed)
Patient was seen today and is asking to for "something for pain".

## 2015-03-01 ENCOUNTER — Telehealth: Payer: Self-pay | Admitting: Certified Nurse Midwife

## 2015-03-01 DIAGNOSIS — N83201 Unspecified ovarian cyst, right side: Secondary | ICD-10-CM

## 2015-03-01 LAB — GC/CHLAMYDIA PROBE AMP, URINE
Chlamydia, Swab/Urine, PCR: NEGATIVE
GC PROBE AMP, URINE: NEGATIVE

## 2015-03-01 NOTE — Telephone Encounter (Signed)
Call to patient to go over PUS benefits. Patient states that she just had an endoscopy and cannot really talk. She requested that I call her back.

## 2015-03-03 NOTE — Telephone Encounter (Signed)
Call to patient to discuss benefits and scheduling for PUS. She asked that I speak with her boyfriend, Ann Heldim Fulton, whom she has given permission to speak on her behalf. Advised of benefit quote received. There is a question for office administration  (staff message sent). Patient is scheduled. This may change pending telephone call to patient from office administration.

## 2015-03-06 NOTE — Telephone Encounter (Signed)
Call to patient. Confirmed patient responsibility due at the time of service. Patient agreeable.

## 2015-03-16 ENCOUNTER — Ambulatory Visit (INDEPENDENT_AMBULATORY_CARE_PROVIDER_SITE_OTHER): Payer: BLUE CROSS/BLUE SHIELD | Admitting: Obstetrics and Gynecology

## 2015-03-16 ENCOUNTER — Ambulatory Visit (INDEPENDENT_AMBULATORY_CARE_PROVIDER_SITE_OTHER): Payer: BLUE CROSS/BLUE SHIELD

## 2015-03-16 ENCOUNTER — Encounter: Payer: Self-pay | Admitting: Obstetrics and Gynecology

## 2015-03-16 VITALS — BP 100/68 | HR 100 | Ht <= 58 in | Wt 142.0 lb

## 2015-03-16 DIAGNOSIS — N83201 Unspecified ovarian cyst, right side: Secondary | ICD-10-CM

## 2015-03-16 DIAGNOSIS — N8320 Unspecified ovarian cysts: Secondary | ICD-10-CM | POA: Diagnosis not present

## 2015-03-16 NOTE — Progress Notes (Signed)
Patient ID: Jennifer Moore, female   DOB: 1980-12-11, 34 y.o.   MRN: [id number]  Subjective  34 y.o. [id number] female here for pelvic ultrasound for  Follow up of an ovarian cyst noted on CT scan done through [facility] on 02/25/15. Indication was hematemesis and RLQ pain.   Told she had a 4 cm cyst on the right ovary. Overall states that the pain is better.    Skipped menses in April.   LMP was 03/02/15.  LMP prior was 3/21 or 3/22.   States this happens when she is under stress.   Objective  Pelvic ultrasound images and report reviewed with patient.  Uterus - 5.5 x 3.7 x 2.4 cm EMS - 4.3 mm Ovaries - left - normal Right - 2 smooth wall cysts - 1 - 25 mm, thin walled, avascular, echofree; 2 - 21 mm, thin wall, internal echoes consistent with hemorrhagic cyst. Free fluid - no  Assessment  2 right ovarian cysts.  Reassuring appearance.   Plan  Discussion of ovarian cysts.  Observation of pain and irregular bleeding.  No further evaluation or treatment needed. Return for annual exam and recheck in 6 weeks.  Call for increased pain or continued abnormal bleeding.   __15_____ minutes face to face time of which over 50% was spent in counseling.   After visit summary to patient.

## 2015-03-24 ENCOUNTER — Emergency Department (INDEPENDENT_AMBULATORY_CARE_PROVIDER_SITE_OTHER)
Admission: EM | Admit: 2015-03-24 | Discharge: 2015-03-24 | Disposition: A | Discharge status: Home / Self Care | Payer: BLUE CROSS/BLUE SHIELD | Source: Home / Self Care | Attending: Family Medicine | Admitting: Family Medicine

## 2015-03-24 DIAGNOSIS — J029 Acute pharyngitis, unspecified: Secondary | ICD-10-CM

## 2015-03-24 DIAGNOSIS — J209 Acute bronchitis, unspecified: Secondary | ICD-10-CM

## 2015-03-24 LAB — POCT RAPID STREP A (OFFICE): Rapid Strep A Screen: NEGATIVE

## 2015-03-24 MED ORDER — LIDOCAINE VISCOUS 2 % MT SOLN: 15.0000 mL | Freq: Three times a day (TID) | OROMUCOSAL | Status: DC

## 2015-03-24 MED ORDER — AZITHROMYCIN 250 MG PO TABS: ORAL_TABLET | ORAL | Status: DC

## 2015-03-24 MED ORDER — MUPIROCIN 2 % EX OINT
1.0000 | TOPICAL_OINTMENT | Freq: Three times a day (TID) | CUTANEOUS | Status: DC
Start: 2015-03-24 — End: 2015-03-24

## 2015-03-24 MED ORDER — GUAIFENESIN-CODEINE 100-10 MG/5ML PO SOLN: ORAL | Status: DC

## 2015-03-24 NOTE — Discharge Instructions (Signed)
Take plain guaifenesin (1200mg  extended release tabs such as Mucinex) twice daily, with plenty of water, for cough and congestion.  Get adequate rest.   May use Afrin nasal spray (or generic oxymetazoline) twice daily for about 5 days.  Also recommend using saline nasal spray several times daily and saline nasal irrigation (AYR is a common brand).   Try warm salt water gargles for sore throat.  Stop all antihistamines for now, and other non-prescription cough/cold preparations.   Follow-up with family doctor if not improving about one week    Salt Water Gargle This solution will help make your mouth and throat feel better. HOME CARE INSTRUCTIONS   Mix 1 teaspoon of salt in 8 ounces of warm water.  Gargle with this solution as much or often as you need or as directed. Swish and gargle gently if you have any sores or wounds in your mouth.  Do not swallow this mixture. Document Released: 08/15/2004 Document Revised: 02/03/2012 Document Reviewed: 01/06/2009 [facility] Patient Information 2015 [employer] [place]. This information is not intended to replace advice given to you by your health care provider. Make sure you discuss any questions you have with your health care provider.

## 2015-03-24 NOTE — ED Notes (Signed)
Pt c/o sore throat x 2 1/2 weeks, low grade fever x past few days.
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
## 2015-06-15 ENCOUNTER — Ambulatory Visit: Payer: BLUE CROSS/BLUE SHIELD | Admitting: Obstetrics and Gynecology

## 2015-06-15 ENCOUNTER — Telehealth: Payer: Self-pay | Admitting: Obstetrics and Gynecology

## 2015-06-15 NOTE — Telephone Encounter (Signed)
Patient was unaware of her appointment for today. She did not receive a reminder call. She is rescheduled for 06/20/15 with Dr. Oscar La

## 2015-06-15 NOTE — Telephone Encounter (Signed)
Thank you for the update!

## 2015-06-20 ENCOUNTER — Ambulatory Visit: Payer: BLUE CROSS/BLUE SHIELD | Admitting: Obstetrics and Gynecology

## 2015-07-17 ENCOUNTER — Encounter (HOSPITAL_COMMUNITY): Payer: Self-pay

## 2015-07-17 ENCOUNTER — Emergency Department (HOSPITAL_COMMUNITY)
Admission: EM | Admit: 2015-07-17 | Discharge: 2015-07-18 | Disposition: A | Discharge status: Other Acute Inpatient Hospital | Payer: BLUE CROSS/BLUE SHIELD | Attending: Physician Assistant | Admitting: Physician Assistant

## 2015-07-17 DIAGNOSIS — F10920 Alcohol use, unspecified with intoxication, uncomplicated: Secondary | ICD-10-CM

## 2015-07-17 DIAGNOSIS — K219 Gastro-esophageal reflux disease without esophagitis: Secondary | ICD-10-CM | POA: Diagnosis not present

## 2015-07-17 DIAGNOSIS — Z87891 Personal history of nicotine dependence: Secondary | ICD-10-CM | POA: Insufficient documentation

## 2015-07-17 DIAGNOSIS — Z8742 Personal history of other diseases of the female genital tract: Secondary | ICD-10-CM | POA: Diagnosis not present

## 2015-07-17 DIAGNOSIS — F419 Anxiety disorder, unspecified: Secondary | ICD-10-CM | POA: Diagnosis not present

## 2015-07-17 DIAGNOSIS — Z8619 Personal history of other infectious and parasitic diseases: Secondary | ICD-10-CM | POA: Insufficient documentation

## 2015-07-17 DIAGNOSIS — F329 Major depressive disorder, single episode, unspecified: Secondary | ICD-10-CM | POA: Diagnosis not present

## 2015-07-17 DIAGNOSIS — F322 Major depressive disorder, single episode, severe without psychotic features: Secondary | ICD-10-CM | POA: Diagnosis not present

## 2015-07-17 DIAGNOSIS — F10120 Alcohol abuse with intoxication, uncomplicated: Secondary | ICD-10-CM | POA: Insufficient documentation

## 2015-07-17 DIAGNOSIS — Z046 Encounter for general psychiatric examination, requested by authority: Secondary | ICD-10-CM | POA: Diagnosis not present

## 2015-07-17 DIAGNOSIS — Z79899 Other long term (current) drug therapy: Secondary | ICD-10-CM | POA: Diagnosis not present

## 2015-07-17 DIAGNOSIS — F32A Depression, unspecified: Secondary | ICD-10-CM

## 2015-07-17 DIAGNOSIS — Z87828 Personal history of other (healed) physical injury and trauma: Secondary | ICD-10-CM | POA: Diagnosis not present

## 2015-07-17 DIAGNOSIS — F1020 Alcohol dependence, uncomplicated: Secondary | ICD-10-CM | POA: Diagnosis present

## 2015-07-17 LAB — CBC
HCT: 38.7 % (ref 36.0–46.0)
Hemoglobin: 13.7 g/dL (ref 12.0–15.0)
MCH: 36.6 pg — ABNORMAL HIGH (ref 26.0–34.0)
MCHC: 35.4 g/dL (ref 30.0–36.0)
MCV: 103.5 fL — AB (ref 78.0–100.0)
Platelets: 216 10*3/uL (ref 150–400)
RBC: 3.74 MIL/uL — AB (ref 3.87–5.11)
RDW: 14.8 % (ref 11.5–15.5)
WBC: 4.1 10*3/uL (ref 4.0–10.5)

## 2015-07-17 LAB — COMPREHENSIVE METABOLIC PANEL
ALBUMIN: 4.6 g/dL (ref 3.5–5.0)
ALT: 88 U/L — ABNORMAL HIGH (ref 14–54)
AST: 477 U/L — ABNORMAL HIGH (ref 15–41)
Alkaline Phosphatase: 142 U/L — ABNORMAL HIGH (ref 38–126)
Anion gap: 20 — ABNORMAL HIGH (ref 5–15)
CHLORIDE: 96 mmol/L — AB (ref 101–111)
CO2: 21 mmol/L — ABNORMAL LOW (ref 22–32)
Calcium: 8.7 mg/dL — ABNORMAL LOW (ref 8.9–10.3)
Creatinine, Ser: 0.4 mg/dL — ABNORMAL LOW (ref 0.44–1.00)
GFR calc Af Amer: >60 mL/min (ref >60)
GLUCOSE: 98 mg/dL (ref 65–99)
POTASSIUM: 3.3 mmol/L — AB (ref 3.5–5.1)
Sodium: 137 mmol/L (ref 135–145)
Total Bilirubin: 3.4 mg/dL — ABNORMAL HIGH (ref 0.3–1.2)
Total Protein: 7.9 g/dL (ref 6.5–8.1)

## 2015-07-17 LAB — ACETAMINOPHEN LEVEL

## 2015-07-17 LAB — SALICYLATE LEVEL: Salicylate Lvl: <4 mg/dL (ref 2.8–30.0)

## 2015-07-17 LAB — ETHANOL: ALCOHOL ETHYL (B): 422 mg/dL — AB (ref <5)

## 2015-07-17 MED ORDER — THIAMINE HCL 100 MG/ML IJ SOLN
Freq: Once | INTRAVENOUS | Status: AC
  Administered 2015-07-17: via INTRAVENOUS
  Filled 2015-07-17: qty 1000

## 2015-07-17 NOTE — ED Notes (Signed)
Pt states that she has a Veterinary surgeon and has an appt in the morning and just wanted to talk to someone
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
## 2015-07-17 NOTE — ED Notes (Signed)
Pt called Mobile Crisis earlier stating that she wanted to kill herself by overdosing on mediation and cutting herself, pt is under IVC by Crisis Hotline.
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
## 2015-07-17 NOTE — BH Assessment (Addendum)
Reviewed ED notes prior to initiating assessment. Per notes pr has hx of anxiety and was making comments about suicide while speaking with mobile crisis earlier today.   Requested cart be placed with pt for assessment.    Assessment to commence shortly.   Requested copy of IVC and crisis assessment to be faxed to [phone].   Clista Bernhardt, [facility] Triage Specialist 07/17/2015 10:36 PM

## 2015-07-18 ENCOUNTER — Inpatient Hospital Stay (HOSPITAL_COMMUNITY)
Admission: AD | Admit: 2015-07-18 | Discharge: 2015-07-20 | DRG: 897 | Disposition: A | Discharge status: Home / Self Care | Payer: BLUE CROSS/BLUE SHIELD | Attending: Psychiatry | Admitting: Psychiatry

## 2015-07-18 DIAGNOSIS — Z87891 Personal history of nicotine dependence: Secondary | ICD-10-CM | POA: Diagnosis not present

## 2015-07-18 DIAGNOSIS — Z046 Encounter for general psychiatric examination, requested by authority: Secondary | ICD-10-CM | POA: Diagnosis not present

## 2015-07-18 DIAGNOSIS — F322 Major depressive disorder, single episode, severe without psychotic features: Secondary | ICD-10-CM | POA: Diagnosis present

## 2015-07-18 DIAGNOSIS — Y908 Blood alcohol level of 240 mg/100 ml or more: Secondary | ICD-10-CM | POA: Diagnosis present

## 2015-07-18 DIAGNOSIS — F1020 Alcohol dependence, uncomplicated: Secondary | ICD-10-CM | POA: Diagnosis present

## 2015-07-18 LAB — BASIC METABOLIC PANEL
Anion gap: 15 (ref 5–15)
CALCIUM: 8.1 mg/dL — AB (ref 8.9–10.3)
CO2: 20 mmol/L — ABNORMAL LOW (ref 22–32)
CREATININE: 0.47 mg/dL (ref 0.44–1.00)
Chloride: 99 mmol/L — ABNORMAL LOW (ref 101–111)
GFR calc non Af Amer: >60 mL/min (ref >60)
Glucose, Bld: 75 mg/dL (ref 65–99)
Potassium: 3.6 mmol/L (ref 3.5–5.1)
SODIUM: 134 mmol/L — AB (ref 135–145)

## 2015-07-18 LAB — RAPID URINE DRUG SCREEN, HOSP PERFORMED
AMPHETAMINES: NOT DETECTED
BENZODIAZEPINES: NOT DETECTED
Barbiturates: NOT DETECTED
Cocaine: NOT DETECTED
Opiates: NOT DETECTED
Tetrahydrocannabinol: NOT DETECTED

## 2015-07-18 MED ORDER — MAGNESIUM HYDROXIDE 400 MG/5ML PO SUSP: 30.0000 mL | Freq: Every day | ORAL | Status: DC | PRN

## 2015-07-18 MED ORDER — ONDANSETRON HCL 4 MG PO TABS: 4.0000 mg | ORAL_TABLET | Freq: Three times a day (TID) | ORAL | Status: DC | PRN

## 2015-07-18 MED ORDER — NICOTINE 21 MG/24HR TD PT24: 21.0000 mg | MEDICATED_PATCH | Freq: Every day | TRANSDERMAL | Status: DC

## 2015-07-18 MED ORDER — ONDANSETRON 4 MG PO TBDP: 4.0000 mg | ORAL_TABLET | Freq: Four times a day (QID) | ORAL | Status: DC | PRN

## 2015-07-18 MED ORDER — ZOLPIDEM TARTRATE 5 MG PO TABS: 5.0000 mg | ORAL_TABLET | Freq: Every evening | ORAL | Status: DC | PRN

## 2015-07-18 MED ORDER — TRAZODONE HCL 50 MG PO TABS: 50.0000 mg | ORAL_TABLET | Freq: Every day | ORAL | Status: DC

## 2015-07-18 MED ORDER — LORAZEPAM 1 MG PO TABS: 1.0000 mg | ORAL_TABLET | Freq: Two times a day (BID) | ORAL | Status: DC

## 2015-07-18 MED ORDER — VITAMIN B-1 100 MG PO TABS
100.0000 mg | ORAL_TABLET | Freq: Every day | ORAL | Status: DC
  Filled 2015-07-18 (×3): qty 1

## 2015-07-18 MED ORDER — LORAZEPAM 1 MG PO TABS: 1.0000 mg | ORAL_TABLET | Freq: Three times a day (TID) | ORAL | Status: DC

## 2015-07-18 MED ORDER — ALUM & MAG HYDROXIDE-SIMETH 200-200-20 MG/5ML PO SUSP: 30.0000 mL | ORAL | Status: DC | PRN

## 2015-07-18 MED ORDER — LORAZEPAM 1 MG PO TABS
1.0000 mg | ORAL_TABLET | Freq: Four times a day (QID) | ORAL | Status: AC
  Administered 2015-07-18 – 2015-07-19 (×5): 1 mg via ORAL
  Filled 2015-07-18 (×5): qty 1

## 2015-07-18 MED ORDER — THIAMINE HCL 100 MG/ML IJ SOLN: 100.0000 mg | Freq: Once | INTRAMUSCULAR | Status: DC

## 2015-07-18 MED ORDER — FLUOXETINE HCL 10 MG PO CAPS
10.0000 mg | ORAL_CAPSULE | Freq: Every day | ORAL | Status: DC
  Filled 2015-07-18 (×4): qty 1

## 2015-07-18 MED ORDER — LOPERAMIDE HCL 2 MG PO CAPS: 2.0000 mg | ORAL_CAPSULE | ORAL | Status: DC | PRN

## 2015-07-18 MED ORDER — FLUOXETINE HCL 10 MG PO CAPS
10.0000 mg | ORAL_CAPSULE | Freq: Every day | ORAL | Status: DC
  Filled 2015-07-18: qty 1

## 2015-07-18 MED ORDER — LORAZEPAM 1 MG PO TABS: 1.0000 mg | ORAL_TABLET | Freq: Every day | ORAL | Status: DC

## 2015-07-18 MED ORDER — LORAZEPAM 1 MG PO TABS
1.0000 mg | ORAL_TABLET | Freq: Three times a day (TID) | ORAL | Status: DC
  Filled 2015-07-18: qty 1

## 2015-07-18 MED ORDER — LORAZEPAM 1 MG PO TABS
1.0000 mg | ORAL_TABLET | Freq: Four times a day (QID) | ORAL | Status: DC | PRN
  Administered 2015-07-18 – 2015-07-20 (×2): 1 mg via ORAL
  Filled 2015-07-18 (×3): qty 1

## 2015-07-18 MED ORDER — LORAZEPAM 1 MG PO TABS: 1.0000 mg | ORAL_TABLET | Freq: Four times a day (QID) | ORAL | Status: DC

## 2015-07-18 MED ORDER — ACETAMINOPHEN 325 MG PO TABS: 650.0000 mg | ORAL_TABLET | Freq: Four times a day (QID) | ORAL | Status: DC | PRN

## 2015-07-18 MED ORDER — HYDROXYZINE HCL 25 MG PO TABS: 25.0000 mg | ORAL_TABLET | Freq: Four times a day (QID) | ORAL | Status: DC | PRN

## 2015-07-18 MED ORDER — HYDROXYZINE HCL 25 MG PO TABS: 25.0000 mg | ORAL_TABLET | Freq: Three times a day (TID) | ORAL | Status: DC | PRN

## 2015-07-18 MED ORDER — ACETAMINOPHEN 325 MG PO TABS: 650.0000 mg | ORAL_TABLET | ORAL | Status: DC | PRN

## 2015-07-18 MED ORDER — LORAZEPAM 1 MG PO TABS
ORAL_TABLET | ORAL | Status: AC
  Filled 2015-07-18: qty 1

## 2015-07-18 MED ORDER — PANTOPRAZOLE SODIUM 40 MG PO TBEC
40.0000 mg | DELAYED_RELEASE_TABLET | Freq: Every day | ORAL | Status: DC
  Administered 2015-07-19 – 2015-07-20 (×2): 40 mg via ORAL
  Filled 2015-07-18 (×3): qty 1

## 2015-07-18 MED ORDER — ADULT MULTIVITAMIN W/MINERALS CH
1.0000 | ORAL_TABLET | Freq: Every day | ORAL | Status: DC
  Filled 2015-07-18 (×4): qty 1

## 2015-07-18 MED ORDER — ALBUTEROL SULFATE HFA 108 (90 BASE) MCG/ACT IN AERS: 1.0000 | INHALATION_SPRAY | Freq: Four times a day (QID) | RESPIRATORY_TRACT | Status: DC | PRN

## 2015-07-18 MED ORDER — LORAZEPAM 1 MG PO TABS: 0.0000 mg | ORAL_TABLET | Freq: Four times a day (QID) | ORAL | Status: DC

## 2015-07-18 MED ORDER — ALPRAZOLAM 0.5 MG PO TABS
0.5000 mg | ORAL_TABLET | Freq: Every evening | ORAL | Status: DC | PRN
  Administered 2015-07-18: 0.5 mg via ORAL
  Filled 2015-07-18: qty 1

## 2015-07-18 MED ORDER — VITAMIN B-1 100 MG PO TABS
100.0000 mg | ORAL_TABLET | Freq: Every day | ORAL | Status: DC
  Filled 2015-07-18: qty 1

## 2015-07-18 MED ORDER — LORAZEPAM 1 MG PO TABS: 0.0000 mg | ORAL_TABLET | Freq: Two times a day (BID) | ORAL | Status: DC

## 2015-07-18 MED ORDER — ADULT MULTIVITAMIN W/MINERALS CH
1.0000 | ORAL_TABLET | Freq: Every day | ORAL | Status: DC
  Filled 2015-07-18 (×2): qty 1

## 2015-07-18 MED ORDER — NICOTINE 21 MG/24HR TD PT24
21.0000 mg | MEDICATED_PATCH | Freq: Every day | TRANSDERMAL | Status: DC
  Filled 2015-07-18 (×3): qty 1

## 2015-07-18 MED ORDER — PANTOPRAZOLE SODIUM 40 MG PO TBEC
40.0000 mg | DELAYED_RELEASE_TABLET | Freq: Every day | ORAL | Status: DC
  Administered 2015-07-18: 40 mg via ORAL
  Filled 2015-07-18: qty 1

## 2015-07-18 MED ORDER — TRAZODONE HCL 50 MG PO TABS
50.0000 mg | ORAL_TABLET | Freq: Every day | ORAL | Status: DC
  Administered 2015-07-19: 50 mg via ORAL
  Filled 2015-07-18 (×3): qty 1
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
## 2015-07-18 NOTE — ED Notes (Signed)
[employer] at the bedside to transport patient.

## 2015-07-18 NOTE — ED Notes (Signed)
Verbal Report given to [facility] RN

## 2015-07-18 NOTE — BH Assessment (Addendum)
[facility] Assessment Progress Note  Per Thedore Mins, MD, this pt requires psychiatric hospitalization at this time.  Pt presents under IVC initiated by Boneta Lucks with mobile crisis, and Dr. Jannifer Franklin has upheld the petition.  Berneice Heinrich, RN, [facility] has assigned pt to [facility] Rm 301-1.  Pt has signed Consent to Release Information and a notification call has been placed.  Signed form has been faxed to [facility] along with IVC documents. Pt's nurse has been notified, and agrees to send original paperwork along with pt via [employer], and to call report to [phone].   Doylene Canning, MA  Triage Specialist  [phone]

## 2015-07-18 NOTE — ED Notes (Addendum)
Pt states: In the last 6 days boyfriend left me and took both of my dogs with him. I have to stay in a hotel and I was lonely. I just wanted someone to talk to. This thing has been taken all out of context. No I don't want to hurt myself. I actually have an appointment at 9 am this morning that I am concerned about with my therapy conseling center. How am I going to get insurance, my boyfriend pays for that. I don't have a car etc.   Addendum: Pt states that she can contract for safety.

## 2015-07-18 NOTE — Progress Notes (Signed)
Patient ID: Jennifer Moore, female   DOB: 1981-07-12, 34 y.o.   MRN: [id number] Napped awhile after taking first Ativan. On awakening reports no more tremors and feels more at ease. Went to dinner with the group and interacting well with her roommate. No complaints voiced.

## 2015-07-18 NOTE — ED Notes (Signed)
Pt and friend made comfortable for the night

## 2015-07-18 NOTE — Progress Notes (Signed)
Recreation Therapy Notes  Animal-Assisted Activity (AAA) Program Checklist/Progress Notes Patient Eligibility Criteria Checklist & Daily Group note for Rec Tx Intervention  Date: 08.23.2016 Time: 2:45pm Location: [address] Dayroom    AAA/T Program Assumption of Risk Form signed by Patient/ or Parent Legal Guardian yes  Patient is free of allergies or sever asthma yes  Patient reports no fear of animals yes  Patient reports no history of cruelty to animals yes  Patient understands his/her participation is voluntary yes  Patient washes hands before animal contact yes  Patient washes hands after animal contact yes  Behavioral Response: Did not attend.   Tynasia Mccaul L Tearra Ouk, LRT/CTRS  Ceylin Dreibelbis L 07/18/2015 3:05 PM 
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
## 2015-07-18 NOTE — ED Notes (Signed)
Visitor at bedside.

## 2015-07-18 NOTE — Progress Notes (Addendum)
Patient did not attended AA group tonight. Patient remained in bed asleep.

## 2015-07-18 NOTE — Progress Notes (Signed)
Patient ID: Jennifer Moore, female   DOB: 25-Apr-1981, 34 y.o.   MRN: [id number] Refused Prozac and multi vitamin and Thiamine injection stating she doesn't need it, she is" not depressed just overwhelmed and its a lot different" She has had a number of losses recently and also has significant financial concerns including this bill. Her SO has been paying for her insurance and bills and now he is gone.

## 2015-07-18 NOTE — ED Notes (Signed)
Tom at bedside to discuss treatment plan.

## 2015-07-19 DIAGNOSIS — F322 Major depressive disorder, single episode, severe without psychotic features: Secondary | ICD-10-CM

## 2015-07-19 DIAGNOSIS — F1020 Alcohol dependence, uncomplicated: Principal | ICD-10-CM
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
## 2015-07-19 NOTE — Progress Notes (Signed)
D: Patient is alert and cooperative.  Denies auditory and visual hallucination.  Reports good night sleep.  Denies pain and discomfort.  Patient reports goal for today is to get discharge.  Patient refused Prozac, multivitamins, stating she doesn't need them. A:  Patient is maintained on routine safety checks per protocol.  Support and encouragement offered. R: Patient contract for safety.  Compliant with treatment plan as ordered.

## 2015-07-19 NOTE — [facility] Group Notes (Signed)
[facility] LCSW Aftercare Discharge Planning Group Note   07/19/2015 10:57 AM  Participation Quality:  Appropriate   Mood/Affect:  Appropriate  Depression Rating:  0  Anxiety Rating:  1  Thoughts of Suicide:  No Will you contract for safety?   NA  Current AVH:  No  Plan for Discharge/Comments:  Pt reports that she has been dealing with the death of her bf and called mobile crisis to vent. She reports never being suicidal and currently minimizes her alcohol abuse. Pt plans to follow-up with her pcp and counselor at d/c and gave CSW permission to make followup appts. Pt receptive to receiving resources for AA/NA, MHA support, and s/a groups.   Transportation Means: aunt   Supports: Soil scientist, Systems developer

## 2015-07-19 NOTE — Progress Notes (Signed)
Patient did not attend N/A group tonight.  
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
## 2015-07-19 NOTE — [facility] Group Notes (Signed)
[facility] LCSW Group Therapy  07/19/2015 1:13 PM  Type of Therapy:  Group Therapy  Participation Level:  Did Not Attend-pt came for last few minutes of group. Was resting in room.   Modes of Intervention:  Confrontation, Discussion, Education, Exploration, Problem-solving, Rapport Building, Socialization and Support  Summary of Progress/Problems: Today's Topic: Overcoming Obstacles. Patients identified one short term goal and potential obstacles in reaching this goal. Patients processed barriers involved in overcoming these obstacles. Patients identified steps necessary for overcoming these obstacles and explored motivation (internal and external) for facing these difficulties head on.   Smart, Alasdair Kleve LCSWA 07/19/2015, 1:13 PM
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
## 2015-07-20 ENCOUNTER — Encounter (HOSPITAL_COMMUNITY): Payer: Self-pay | Admitting: Registered Nurse

## 2015-07-20 MED ORDER — ALBUTEROL SULFATE HFA 108 (90 BASE) MCG/ACT IN AERS: 1.0000 | INHALATION_SPRAY | Freq: Four times a day (QID) | RESPIRATORY_TRACT | Status: AC | PRN

## 2015-07-20 MED ORDER — ADULT MULTIVITAMIN W/MINERALS CH: 1.0000 | ORAL_TABLET | Freq: Every day | ORAL | Status: DC

## 2015-07-20 MED ORDER — FLUOXETINE HCL 10 MG PO CAPS: 10.0000 mg | ORAL_CAPSULE | Freq: Every day | ORAL | Status: DC

## 2015-07-20 MED ORDER — TRAZODONE HCL 50 MG PO TABS: 50.0000 mg | ORAL_TABLET | Freq: Every evening | ORAL | Status: DC | PRN

## 2015-07-20 NOTE — Progress Notes (Signed)
Discharge Note: Patient is alert and cooperative. Denies SI/HI/AVH and pain.  Patient contract for safety.Patient discharge home.    Discharge instructions and prescriptions reviewed with patient.  Patient verbalizes understanding of discharge instructions.  Personal belongings returned to patient.

## 2015-07-20 NOTE — [facility] Suicide Risk Assessment (Signed)
[facility] INPATIENT:  Family/Significant Other Suicide Prevention Education  Suicide Prevention Education:  Patient Refusal for Family/Significant Other Suicide Prevention Education: The patient Jennifer Moore has refused to provide written consent for family/significant other to be provided Family/Significant Other Suicide Prevention Education during admission and/or prior to discharge.  Physician notified.  Pt requested that SPE be completed with her and only discharge info to be shared with family. SPE completed with pt, as pt refused to consent to family contact. SPI pamphlet provided to pt and pt was encouraged to share information with support network, ask questions, and talk about any concerns relating to SPE. Pt denies access to guns/firearms and verbalized understanding of information provided. Mobile Crisis information also provided to pt. Jimmye Norman, [place] Berkey LCSWA  07/20/2015, 9:08 AM

## 2015-07-20 NOTE — Progress Notes (Signed)
Patient ID: Jennifer Moore, female   DOB: [birth year], 34 y.o.   MRN: [id number]  D: Patient pleasant tonight. Was sleeping earlier and missed group tonight. Did get up and take her night ativan and trazodone due to roommate snoring very loudly. Currently denies any SI and any major withdrawal symptoms. Reports that she is suppose to leave at 11am today. A: Staff will monitor on q 15 minute checks, follow treatment plan, and give meds as ordered. R: Cooperative on the unit.
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
## 2015-07-20 NOTE — Tx Team (Signed)
Initial Interdisciplinary Treatment Plan   PATIENT STRESSORS: Loss of boyfriend that died recently and homelessness   PATIENT STRENGTHS: Ability for insight Average or above average intelligence Capable of independent living Communication skills Physical Health   PROBLEM LIST: Problem List/Patient Goals Date to be addressed Date deferred Reason deferred Estimated date of resolution  " not depressed, mourning" 07/19/15     "stress" 07/19/15                                                DISCHARGE CRITERIA:  Ability to meet basic life and health needs Adequate post-discharge living arrangements Withdrawal symptoms are absent or subacute and managed without 24-hour nursing intervention  PRELIMINARY DISCHARGE PLAN: Outpatient therapy Placement in alternative living arrangements  PATIENT/FAMIILY INVOLVEMENT: This treatment plan has been presented to and reviewed with the patient, Jennifer Moore, and/or family member, .  The patient and family have been given the opportunity to ask questions and make suggestions.  Manuela Schwartz [facility] 07/20/2015, 2:29 AM

## 2015-07-20 NOTE — Progress Notes (Signed)
  [facility] Adult Case Management Discharge Plan :  Will you be returning to the same living situation after discharge:  Yes,  hotel. pt plans to eventually move in with disabled mother At discharge, do you have transportation home?: Yes,  aunt Do you have the ability to pay for your medications: Yes,  [employer] private insurance  Release of information consent forms completed and submitted to medical records by CSW.  Patient to Follow up at: Follow-up Information    Follow up with [facility] On 07/25/2015.   Why:  Standing appt on this date at 9:00AM for therapy/counseling.    Contact information:   ATTN: Verlan Friends [address]  [place], [place] [address] Phone: [phone] Fax: [phone]      Follow up with [facility].   Why:  Social worker called to schedule hospital follow-up appt-was instructed to inform patient to call to schedule appt at time of discharge. Please contact your primary care doctor to schedule follow-up appt for medication management. Thank you.    Contact information:   ATTN: Sharion Settler [address] [place], [place] [address] Phone: [phone] Fax: [phone]      Patient denies SI/HI: Yes,  during group/self report.     Safety Planning and Suicide Prevention discussed: Yes,  SPE completed with pt, as she did not want contact made with family for SPE. SPI pamphlet provided to pt and she was encouraged to share information with support network, ask questions, and talk about any concerns relating to SPE  Have you used any form of tobacco in the last 30 days? (Cigarettes, Smokeless Tobacco, Cigars, and/or Pipes): No  Has patient been referred to the Quitline?: N/A patient is not a smoker  Smart, Aquilla LCSWA  07/20/2015, 9:44 AM
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
## 2015-08-18 ENCOUNTER — Ambulatory Visit (INDEPENDENT_AMBULATORY_CARE_PROVIDER_SITE_OTHER): Payer: BLUE CROSS/BLUE SHIELD | Admitting: Family Medicine

## 2015-08-18 ENCOUNTER — Ambulatory Visit (INDEPENDENT_AMBULATORY_CARE_PROVIDER_SITE_OTHER): Payer: BLUE CROSS/BLUE SHIELD

## 2015-08-18 VITALS — BP 110/68 | HR 95 | Temp 98.4°F | Resp 16 | Ht <= 58 in | Wt 120.0 lb

## 2015-08-18 DIAGNOSIS — M79671 Pain in right foot: Secondary | ICD-10-CM

## 2015-08-18 DIAGNOSIS — S93401A Sprain of unspecified ligament of right ankle, initial encounter: Secondary | ICD-10-CM

## 2015-08-18 MED ORDER — ACETAMINOPHEN 325 MG PO TABS
500.0000 mg | ORAL_TABLET | Freq: Once | ORAL | Status: AC
  Administered 2015-08-18: 500 mg via ORAL

## 2015-08-18 NOTE — Progress Notes (Signed)
° °  Subjective:    Patient ID: Jennifer Moore, female    DOB: Mar 29, 1981, 34 y.o.   MRN: [id number] This chart was scribed for Elvina Sidle, MD by Littie Deeds, Medical Scribe. This patient was seen in Room 6 and the patient's care was started at 1:26 PM.   HPI HPI Comments: NERISSA CONSTANTIN is a 34 y.o. female who presents to the [facility] complaining of sudden onset right ankle pain secondary to a fall 6 days ago. She states she fell over a step of her apartment. She also has some mild soreness to her right lower leg. Patient has been ambulating this past week. She denies head injury or any other injuries when she fell. She has allergies to ibuprofen.  Patient lives with her mother who has dementia. She does not have a car.   Review of Systems  Musculoskeletal: Positive for myalgias and arthralgias.  Neurological: Negative for syncope.       Objective:   Physical Exam CONSTITUTIONAL: Well developed/well nourished HEAD: Normocephalic/atraumatic EYES: EOM/PERRL ENMT: Mucous membranes moist NECK: supple no meningeal signs SPINE: entire spine nontender CV: S1/S2 noted, no murmurs/rubs/gallops noted LUNGS: Lungs are clear to auscultation bilaterally, no apparent distress ABDOMEN: soft, nontender, no rebound or guarding GU: no cva tenderness NEURO: Pt is awake/alert, moves all extremitiesx4 EXTREMITIES: Right ankle is ecchymotic and swollen. The swelling and ecchymosis extends to the dorsal foot and up above the right lateral malleolus. SKIN: warm, color normal PSYCH: Staccato speech pattern.  [facility] reading (PRIMARY) by  Dr. Milus Glazier: negative foot and ankle.      Assessment & Plan:   By signing my name below, I, Littie Deeds, attest that this documentation has been prepared under the direction and in the presence of Elvina Sidle, MD.  Electronically Signed: Littie Deeds, Medical Scribe. 08/18/2015. 1:26 PM.  Assessment: Acute ankle sprain on the right.  I'm just a bit unclear about why patient is coming in a week after the injury. She states that she's been getting along without pain medicine but she is asking about my prescribing more pain medicine. She denies having a drug problem.  Plan: Loney Hering, follow-up with her orthopedist as needed.  Signed, Sheila Oats.D.

## 2015-09-17 ENCOUNTER — Other Ambulatory Visit (HOSPITAL_COMMUNITY): Payer: Self-pay | Admitting: Psychiatry

## 2015-10-12 ENCOUNTER — Emergency Department (HOSPITAL_COMMUNITY)
Admission: EM | Admit: 2015-10-12 | Discharge: 2015-10-13 | Disposition: A | Discharge status: Home / Self Care | Payer: BLUE CROSS/BLUE SHIELD | Attending: Emergency Medicine | Admitting: Emergency Medicine

## 2015-10-12 ENCOUNTER — Encounter (HOSPITAL_COMMUNITY): Payer: Self-pay

## 2015-10-12 DIAGNOSIS — Z3202 Encounter for pregnancy test, result negative: Secondary | ICD-10-CM | POA: Insufficient documentation

## 2015-10-12 DIAGNOSIS — R000 Tachycardia, unspecified: Secondary | ICD-10-CM | POA: Diagnosis not present

## 2015-10-12 DIAGNOSIS — X509XXA Other and unspecified overexertion or strenuous movements or postures, initial encounter: Secondary | ICD-10-CM | POA: Diagnosis not present

## 2015-10-12 DIAGNOSIS — Z791 Long term (current) use of non-steroidal anti-inflammatories (NSAID): Secondary | ICD-10-CM | POA: Insufficient documentation

## 2015-10-12 DIAGNOSIS — Z8742 Personal history of other diseases of the female genital tract: Secondary | ICD-10-CM | POA: Insufficient documentation

## 2015-10-12 DIAGNOSIS — K219 Gastro-esophageal reflux disease without esophagitis: Secondary | ICD-10-CM | POA: Diagnosis not present

## 2015-10-12 DIAGNOSIS — Z79899 Other long term (current) drug therapy: Secondary | ICD-10-CM | POA: Insufficient documentation

## 2015-10-12 DIAGNOSIS — S93401A Sprain of unspecified ligament of right ankle, initial encounter: Secondary | ICD-10-CM | POA: Insufficient documentation

## 2015-10-12 DIAGNOSIS — Y9289 Other specified places as the place of occurrence of the external cause: Secondary | ICD-10-CM | POA: Insufficient documentation

## 2015-10-12 DIAGNOSIS — Y998 Other external cause status: Secondary | ICD-10-CM | POA: Insufficient documentation

## 2015-10-12 DIAGNOSIS — S99911A Unspecified injury of right ankle, initial encounter: Secondary | ICD-10-CM | POA: Diagnosis present

## 2015-10-12 DIAGNOSIS — Z8619 Personal history of other infectious and parasitic diseases: Secondary | ICD-10-CM | POA: Diagnosis not present

## 2015-10-12 DIAGNOSIS — F419 Anxiety disorder, unspecified: Secondary | ICD-10-CM | POA: Insufficient documentation

## 2015-10-12 DIAGNOSIS — Z8781 Personal history of (healed) traumatic fracture: Secondary | ICD-10-CM | POA: Insufficient documentation

## 2015-10-12 DIAGNOSIS — Z87891 Personal history of nicotine dependence: Secondary | ICD-10-CM | POA: Diagnosis not present

## 2015-10-12 DIAGNOSIS — Y9389 Activity, other specified: Secondary | ICD-10-CM | POA: Insufficient documentation

## 2015-10-12 DIAGNOSIS — Z91410 Personal history of adult physical and sexual abuse: Secondary | ICD-10-CM | POA: Diagnosis not present

## 2015-10-12 DIAGNOSIS — Z8543 Personal history of malignant neoplasm of ovary: Secondary | ICD-10-CM | POA: Insufficient documentation

## 2015-10-12 MED ORDER — OXYCODONE-ACETAMINOPHEN 5-325 MG PO TABS
2.0000 | ORAL_TABLET | Freq: Once | ORAL | Status: AC
  Administered 2015-10-13: 2 via ORAL
  Filled 2015-10-12: qty 2

## 2015-10-12 NOTE — ED Notes (Signed)
Hannah, PA at bedside  
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
## 2015-10-13 ENCOUNTER — Emergency Department (HOSPITAL_COMMUNITY): Payer: BLUE CROSS/BLUE SHIELD

## 2015-10-13 LAB — POC URINE PREG, ED: PREG TEST UR: NEGATIVE

## 2015-10-13 MED ORDER — DICLOFENAC SODIUM 1 % TD GEL: 2.0000 g | Freq: Four times a day (QID) | TRANSDERMAL | Status: DC

## 2015-10-13 NOTE — ED Notes (Signed)
Ortho at bedside at this time.

## 2015-10-13 NOTE — Discharge Instructions (Signed)
1. Medications: Use tylenol for pain control, also use voltaren gel; usual home medications 2. Treatment: rest, ice, elevate and use brace, drink plenty of fluids, gentle stretching 3. Follow Up: Please followup with orthopedics as directed or your PCP in 1 week if no improvement for discussion of your diagnoses and further evaluation after today's visit; if you do not have a primary care doctor use the resource guide provided to find one; Please return to the ER for worsening symptoms or other concerns    Ankle Sprain An ankle sprain is an injury to the strong, fibrous tissues (ligaments) that hold the bones of your ankle joint together.  CAUSES An ankle sprain is usually caused by a fall or by twisting your ankle. Ankle sprains most commonly occur when you step on the outer edge of your foot, and your ankle turns inward. People who participate in sports are more prone to these types of injuries.  SYMPTOMS   Pain in your ankle. The pain may be present at rest or only when you are trying to stand or walk.  Swelling.  Bruising. Bruising may develop immediately or within 1 to 2 days after your injury.  Difficulty standing or walking, particularly when turning corners or changing directions. DIAGNOSIS  Your caregiver will ask you details about your injury and perform a physical exam of your ankle to determine if you have an ankle sprain. During the physical exam, your caregiver will press on and apply pressure to specific areas of your foot and ankle. Your caregiver will try to move your ankle in certain ways. An X-ray exam may be done to be sure a bone was not broken or a ligament did not separate from one of the bones in your ankle (avulsion fracture).  TREATMENT  Certain types of braces can help stabilize your ankle. Your caregiver can make a recommendation for this. Your caregiver may recommend the use of medicine for pain. If your sprain is severe, your caregiver may refer you to a surgeon  who helps to restore function to parts of your skeletal system (orthopedist) or a physical therapist. HOME CARE INSTRUCTIONS   Apply ice to your injury for 1-2 days or as directed by your caregiver. Applying ice helps to reduce inflammation and pain.  Put ice in a plastic bag.  Place a towel between your skin and the bag.  Leave the ice on for 15-20 minutes at a time, every 2 hours while you are awake.  Only take over-the-counter or prescription medicines for pain, discomfort, or fever as directed by your caregiver.  Elevate your injured ankle above the level of your heart as much as possible for 2-3 days.  If your caregiver recommends crutches, use them as instructed. Gradually put weight on the affected ankle. Continue to use crutches or a cane until you can walk without feeling pain in your ankle.  If you have a plaster splint, wear the splint as directed by your caregiver. Do not rest it on anything harder than a pillow for the first 24 hours. Do not put weight on it. Do not get it wet. You may take it off to take a shower or bath.  You may have been given an elastic bandage to wear around your ankle to provide support. If the elastic bandage is too tight (you have numbness or tingling in your foot or your foot becomes cold and blue), adjust the bandage to make it comfortable.  If you have an air splint, you may  blow more air into it or let air out to make it more comfortable. You may take your splint off at night and before taking a shower or bath. Wiggle your toes in the splint several times per day to decrease swelling. SEEK MEDICAL CARE IF:   You have rapidly increasing bruising or swelling.  Your toes feel extremely cold or you lose feeling in your foot.  Your pain is not relieved with medicine. SEEK IMMEDIATE MEDICAL CARE IF:  Your toes are numb or blue.  You have severe pain that is increasing. MAKE SURE YOU:   Understand these instructions.  Will watch your  condition.  Will get help right away if you are not doing well or get worse.   This information is not intended to replace advice given to you by your health care provider. Make sure you discuss any questions you have with your health care provider.   Document Released: 11/11/2005 Document Revised: 12/02/2014 Document Reviewed: 11/23/2011 Elsevier Interactive Patient Education 2016 [employer].    Emergency Department Resource Guide 1) Find a Doctor and Pay Out of Pocket Although you won't have to find out who is covered by your insurance plan, it is a good idea to ask around and get recommendations. You will then need to call the office and see if the doctor you have chosen will accept you as a new patient and what types of options they offer for patients who are self-pay. Some doctors offer discounts or will set up payment plans for their patients who do not have insurance, but you will need to ask so you aren't surprised when you get to your appointment.  2) Contact Your Local Health Department Not all health departments have doctors that can see patients for sick visits, but many do, so it is worth a call to see if yours does. If you don't know where your local health department is, you can check in your phone book. The CDC also has a tool to help you locate your state's health department, and many state websites also have listings of all of their local health departments.  3) Find a Walk-in Clinic If your illness is not likely to be very severe or complicated, you may want to try a walk in clinic. These are popping up all over the country in pharmacies, drugstores, and shopping centers. They're usually staffed by nurse practitioners or physician assistants that have been trained to treat common illnesses and complaints. They're usually fairly quick and inexpensive. However, if you have serious medical issues or chronic medical problems, these are probably not your best option.  No Primary  Care Doctor: - Call Health Connect at  [phone] - they can help you locate a primary care doctor that  accepts your insurance, provides certain services, etc. - Physician Referral Service- [phone]  Chronic Pain Problems: Organization         Address  Phone   Notes  [facility]  [phone] Patients need to be referred by their primary care doctor.   Medication Assistance: Organization         Address  Phone   Notes  [facility] Medication [facility] [address] [place], [place] [address] [phone] --Must be a resident of [facility] -- Must have NO insurance coverage whatsoever (no Medicaid/ Medicare, etc.) -- The pt. MUST have a primary care doctor that directs their care regularly and follows them in the community   MedAssist  (  [phone]   [employer]  [phone]    Agencies that provide inexpensive medical care: Organization         Address  Phone   Notes  [facility]  [phone]   [facility]    [phone]   [facility] [address] [place], [place] [address] [phone]   [facility] [address] [place], [place] [phone]   Planned Parenthood    [phone]   [facility]    [phone]   [facility]  [address], [place] Phone:  [phone], Fax:  [phone] Hours of Operation:  9 am - 6 pm, M-F.  Also accepts Medicaid/Medicare and self-pay.  [facility]  [address], [place] Phone: [phone], Fax: [phone]. Hours of Operation:  8:30 am - 5:30 pm, M-F.  Also accepts Medicaid and self-pay.  [facility] [place] [address] [place] Phone: [phone]   [facility] [address] Natasha Bence [place], [place] [phone], Ext. [phone] Mondays & Thursdays: 7-9 AM.  First 15 patients are seen on a first  come, first serve basis.    Medicaid-accepting [facility] Providers:  Organization         Address  Phone   Notes  [facility] [address], [place] [phone] Also accepts self-pay patients.  [facility] [address] Laurell Josephs [place], [place]  [phone]   [facility] [address], [place] [phone]   [facility] [address] [place] [phone]   Renaye Rakers [address], [place]   [phone] Only accepts [place] Access [place] patients after they have their name applied to their card.   Self-Pay (no insurance) in [facility]:  Organization         Address  Phone   Notes  Sickle Cell Patients, [facility] [address] [place], [place] [phone]   [facility] [address] [place], [place] [phone]   [facility] [place]  [address], [place] [phone]   [facility]/Dr. Osei-Bonsu  [address], [place] or [address], [place] [phone] Phone number for both [place] and [place] locations is the same.  [facility] [address], [place] [phone]   [facility] [address] [place] or [address] [phone]   [facility] [address] [place] [phone], phone; [phone], fax Sees patients 1st and 3rd Saturday of every month.  Must not qualify for public or private insurance (i.e. Medicaid, Medicare, [place] Health Choice, Veterans' Benefits)  Household income should be no more than 200% of the poverty level The clinic cannot treat you if you are pregnant or think you are pregnant  Sexually transmitted diseases are not treated at the clinic.    Dental Care: Organization          Address  Phone  Notes  [facility] [address] [place], [place] [phone] Accepts children up to age 40 who are enrolled in [place] or [place] Health Choice; pregnant women  with a Medicaid card; and children who have applied for Medicaid or [place] Health Choice, but were declined, whose parents can pay a reduced fee at time of service.  [facility] [place]  [address], [place] [phone] Accepts children up to age 33 who are enrolled in [place] or [place] Health Choice; pregnant women with a Medicaid card; and children who have applied for Medicaid or [place] Health Choice, but were declined, whose parents can pay a reduced fee at time of service.  [facility]  [address], [place] [phone] Patients are seen by appointment only. Walk-ins are not accepted. [facility] will see patients 33 years of age and older. Monday - Tuesday (8am-5pm) Most Wednesdays (8:30-5pm) $30 per visit, cash only  [facility]  [address], [facility] [place] [phone] Patients are seen by appointment only. Walk-ins are not accepted. [facility] will see patients 33 years of age and older. One Wednesday Evening (Monthly: Volunteer Based).  $30 per visit, cash only  [facility]  [phone]) [phone] for adults; Children under age 814, call Graduate Pediatric Dentistry at [phone]) [phone]. Children aged 684-14, please call [phone] to request a pediatric application.  Dental services are provided in all areas of dental care including fillings, crowns and bridges, complete and partial dentures, implants, gum treatment, root canals, and extractions. Preventive care is also provided. Treatment is provided to both adults and children. Patients are selected via a lottery and there is often a waiting list.   [facility] [address], [place]  [phone] [email or web address]   [facility] [address], [place], [place] [phone], Ext. [phone] Second and Fourth Thursday of each month, opens at 6:30 AM; Clinic ends at 9 AM.  Patients are seen on a first-come first-served basis, and a limited number are seen during each clinic.   [facility]  [address], [place], [place] [phone]   Eligibility Requirements You must have lived in [place], [place], or [place] counties for at least the last three months.   You cannot be eligible for state or federal sponsored [place] Cityhealthcare insurance, including CIGNAVeterans Administration, [place]icaid, or [employer]icare.   You generally cannot be eligible for healthcare insurance through your employer.    How to apply: Eligibility screenings are held every Tuesday and Wednesday afternoon from 1:00 pm until 4:00 pm. You do not need an appointment for the interview!  [facility] [address], [place], [place] [phone]   [facility]  [phone]   [facility]  [phone]   [facility]  [phone]    Behavioral Health Resources in the Community: Intensive Outpatient Programs Organization         Address  Phone  Notes  [facility] [address], [place], [place] [phone]   [facility] [address], [place], [place] [phone]   [facility] [address], [place], [place]  [phone]   [facility] [address],  [place], [place] [phone] or [phone]   Substance Abuse Resources Organization         Address  Phone  Notes  [facility]  [phone]   [facility]  [phone]   [facility]  [phone]   [facility]  [phone]   Residential & Outpatient Substance Abuse Program  [phone]   Psychological  Services Organization         Address  Phone  Notes  [facility] Behavioral Health  [phone]   [facility] Services  [phone]   [facility] [address], [place] [phone] or [phone]    Mobile Crisis Teams Organization         Address  Phone  Notes  [facility], Mobile Crisis Care Unit  [phone]   [facility]  [address] [place], [place] [phone]   Doristine Locks [address] [place] [phone]    Self-Help/Support Groups Organization         Address  Phone             Notes  Mental Health Assoc. of  - variety of support groups  336- I7437963 Call for more information  Narcotics Anonymous (NA), [facility] [address], [employer] [place]  2 meetings at this location   Statistician         Address  Phone  Notes  [facility] [address],    [place]  [phone]   [facility]  [address] [place] [address], [place], [place] [phone]   [facility] [address] [place], [place] [phone] Admissions: 8am-3pm M-F  [facility] [address]    [place], [place] [phone]   [facility] [address] [place], [place], [place] [phone]   [facility] [address]  [place], [place] [phone]   [facility] - Intensive Outpatient [address] Laurell Josephs [address], [place], [place] [phone]   [facility] ([facility]iction [facility] [address] [place].,  [place], [place] [phone] or [phone]   [facility] ([facility]) [address] [place] [address], [place], [place] [phone] Accepts Medicaid  [facility] [address]  [place] [phone] Substance Abuse/Addiction Treatment   [facility] Organization         Address  Phone  Notes  [facility]  [phone]   Angie Fava, PhD [address] Ervin Knack [place], [place]   [phone] or [phone]   [facility]   [address] [place], [place] [phone]   [facility] [address], [place], [place] [phone] Insurance/Medicaid/sponsorship through [facility] [address]                                    [place], [place] [phone] Therapy/tele-psych/case  [facility] [address], [place] [phone]    Dr. Lolly Mustache  [phone]   [facility]  [employer] [facility] Dept. 1) [address] [place] 2) [address] [place] 3)  [address], [place] [phone]  [phone]   [facility] Child Abuse Hotline (605)504-0250 or [phone] (After Hours)

## 2015-10-13 NOTE — ED Notes (Signed)
Discharge instructions/prescription reviewed with patient. Understanding verbalized. Patient declined wheelchair at time of discharge. Patient ambulated with ortho boot and crutches from ED. Significant other walking beside patient. No acute distress noted.

## 2015-10-13 NOTE — ED Notes (Signed)
PA at bedside at this time.  

## 2015-10-13 NOTE — ED Notes (Signed)
Ortho contacted to provide cam walker/boot and crutches as ordered prior to discharge.

## 2015-10-13 NOTE — ED Notes (Signed)
Patient transported to X-ray 

## 2016-04-08 ENCOUNTER — Emergency Department (HOSPITAL_COMMUNITY)
Admission: EM | Admit: 2016-04-08 | Discharge: 2016-04-08 | Disposition: A | Discharge status: Home / Self Care | Payer: BLUE CROSS/BLUE SHIELD | Attending: Emergency Medicine | Admitting: Emergency Medicine

## 2016-04-08 ENCOUNTER — Emergency Department (HOSPITAL_COMMUNITY): Payer: BLUE CROSS/BLUE SHIELD

## 2016-04-08 ENCOUNTER — Encounter (HOSPITAL_COMMUNITY): Payer: Self-pay | Admitting: Emergency Medicine

## 2016-04-08 DIAGNOSIS — Y999 Unspecified external cause status: Secondary | ICD-10-CM | POA: Insufficient documentation

## 2016-04-08 DIAGNOSIS — Z87891 Personal history of nicotine dependence: Secondary | ICD-10-CM | POA: Insufficient documentation

## 2016-04-08 DIAGNOSIS — Z79891 Long term (current) use of opiate analgesic: Secondary | ICD-10-CM | POA: Insufficient documentation

## 2016-04-08 DIAGNOSIS — X58XXXA Exposure to other specified factors, initial encounter: Secondary | ICD-10-CM | POA: Insufficient documentation

## 2016-04-08 DIAGNOSIS — M79671 Pain in right foot: Secondary | ICD-10-CM

## 2016-04-08 DIAGNOSIS — Y929 Unspecified place or not applicable: Secondary | ICD-10-CM | POA: Insufficient documentation

## 2016-04-08 DIAGNOSIS — Y939 Activity, unspecified: Secondary | ICD-10-CM | POA: Insufficient documentation

## 2016-04-08 DIAGNOSIS — S99911A Unspecified injury of right ankle, initial encounter: Secondary | ICD-10-CM | POA: Insufficient documentation

## 2016-04-08 LAB — CBC WITH DIFFERENTIAL/PLATELET
BASOS PCT: 15 %
Basophils Absolute: 0.8 10*3/uL — ABNORMAL HIGH (ref 0.0–0.1)
EOS PCT: 3 %
Eosinophils Absolute: 0.2 10*3/uL (ref 0.0–0.7)
HEMATOCRIT: 41.1 % (ref 36.0–46.0)
HEMOGLOBIN: 14.5 g/dL (ref 12.0–15.0)
LYMPHS ABS: 0.7 10*3/uL (ref 0.7–4.0)
Lymphocytes Relative: 14 %
MCH: 37.6 pg — ABNORMAL HIGH (ref 26.0–34.0)
MCHC: 35.3 g/dL (ref 30.0–36.0)
MCV: 106.5 fL — AB (ref 78.0–100.0)
MONOS PCT: 6 %
Monocytes Absolute: 0.3 10*3/uL (ref 0.1–1.0)
NEUTROS ABS: 3.1 10*3/uL (ref 1.7–7.7)
Neutrophils Relative %: 62 %
Platelets: 401 10*3/uL — ABNORMAL HIGH (ref 150–400)
RBC: 3.86 MIL/uL — ABNORMAL LOW (ref 3.87–5.11)
RDW: 15.2 % (ref 11.5–15.5)
WBC: 5.1 10*3/uL (ref 4.0–10.5)

## 2016-04-08 LAB — PREGNANCY, URINE: Preg Test, Ur: NEGATIVE

## 2016-04-08 LAB — RAPID URINE DRUG SCREEN, HOSP PERFORMED
AMPHETAMINES: NOT DETECTED
Barbiturates: NOT DETECTED
Benzodiazepines: NOT DETECTED
Cocaine: NOT DETECTED
OPIATES: POSITIVE — AB
TETRAHYDROCANNABINOL: POSITIVE — AB

## 2016-04-08 LAB — BASIC METABOLIC PANEL
Anion gap: 15 (ref 5–15)
CHLORIDE: 103 mmol/L (ref 101–111)
CO2: 23 mmol/L (ref 22–32)
CREATININE: 0.38 mg/dL — AB (ref 0.44–1.00)
Calcium: 8.9 mg/dL (ref 8.9–10.3)
GFR calc Af Amer: >60 mL/min (ref >60)
GFR calc non Af Amer: >60 mL/min (ref >60)
GLUCOSE: 114 mg/dL — AB (ref 65–99)
POTASSIUM: 3.2 mmol/L — AB (ref 3.5–5.1)
Sodium: 141 mmol/L (ref 135–145)

## 2016-04-08 LAB — URINALYSIS, ROUTINE W REFLEX MICROSCOPIC
BILIRUBIN URINE: NEGATIVE
GLUCOSE, UA: NEGATIVE mg/dL
HGB URINE DIPSTICK: NEGATIVE
Ketones, ur: NEGATIVE mg/dL
Leukocytes, UA: NEGATIVE
NITRITE: NEGATIVE
PH: 6.5 (ref 5.0–8.0)
Protein, ur: NEGATIVE mg/dL
SPECIFIC GRAVITY, URINE: 1.004 — AB (ref 1.005–1.030)

## 2016-04-08 LAB — ETHANOL: Alcohol, Ethyl (B): 207 mg/dL — ABNORMAL HIGH (ref <5)

## 2016-04-08 MED ORDER — KETOROLAC TROMETHAMINE 30 MG/ML IJ SOLN
30.0000 mg | Freq: Once | INTRAMUSCULAR | Status: AC
  Administered 2016-04-08: 30 mg via INTRAVENOUS
  Filled 2016-04-08: qty 1

## 2016-04-08 MED ORDER — SODIUM CHLORIDE 0.9 % IV BOLUS (SEPSIS)
1000.0000 mL | Freq: Once | INTRAVENOUS | Status: AC
  Administered 2016-04-08: 1000 mL via INTRAVENOUS

## 2016-04-08 NOTE — ED Notes (Signed)
Bed: WA20 Expected date:  Expected time:  Means of arrival:  Comments: EMS-ankle fracture

## 2016-04-08 NOTE — Discharge Instructions (Signed)
There does not appear to be an emergent cause for your symptoms at this time. Your exam, labs and x-rays were all reassuring. Please follow-up with your orthopedist for further evaluation and management of your symptoms. It is important to stay well hydrated and drink lots of water. Return to ED for new or worsening symptoms

## 2016-04-08 NOTE — ED Notes (Signed)
Pt upset with her DC instructions.  She wants the ED to fix her foot.  This RN explained to Pt that her foot has healed by this time, but incorrectly and she must work with Orthopedics to correct it.  Pt refused DC vitals and signature.

## 2016-04-08 NOTE — ED Notes (Signed)
Pt injured her R ankle 9 months ago and has tried to self heal for the last 9 months.  She has been wearing a boot from [facility] FOR THE LAST 9 MONTHS .  Ankle is turned inward and has significant swelling.  Pt has no insurance so has put off care.  BP 110/80 P 160 RR 20   93% RA

## 2016-04-08 NOTE — ED Notes (Signed)
RN starting IV, drawing labs 

## 2016-04-08 NOTE — ED Notes (Signed)
Patient transported to X-ray 
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
## 2016-04-09 LAB — PATHOLOGIST SMEAR REVIEW

## 2016-09-09 ENCOUNTER — Encounter (HOSPITAL_COMMUNITY): Payer: Self-pay | Admitting: *Deleted

## 2016-09-09 ENCOUNTER — Emergency Department (HOSPITAL_COMMUNITY): Payer: BLUE CROSS/BLUE SHIELD

## 2016-09-09 ENCOUNTER — Emergency Department (HOSPITAL_COMMUNITY)
Admission: EM | Admit: 2016-09-09 | Discharge: 2016-09-10 | Disposition: A | Discharge status: Home / Self Care | Payer: BLUE CROSS/BLUE SHIELD | Attending: Emergency Medicine | Admitting: Emergency Medicine

## 2016-09-09 DIAGNOSIS — Y939 Activity, unspecified: Secondary | ICD-10-CM | POA: Insufficient documentation

## 2016-09-09 DIAGNOSIS — Y9289 Other specified places as the place of occurrence of the external cause: Secondary | ICD-10-CM | POA: Insufficient documentation

## 2016-09-09 DIAGNOSIS — Y999 Unspecified external cause status: Secondary | ICD-10-CM | POA: Insufficient documentation

## 2016-09-09 DIAGNOSIS — M25571 Pain in right ankle and joints of right foot: Secondary | ICD-10-CM | POA: Insufficient documentation

## 2016-09-09 DIAGNOSIS — M79671 Pain in right foot: Secondary | ICD-10-CM

## 2016-09-09 DIAGNOSIS — X501XXA Overexertion from prolonged static or awkward postures, initial encounter: Secondary | ICD-10-CM | POA: Insufficient documentation

## 2016-09-09 DIAGNOSIS — Z87891 Personal history of nicotine dependence: Secondary | ICD-10-CM | POA: Insufficient documentation

## 2016-09-09 DIAGNOSIS — Z79899 Other long term (current) drug therapy: Secondary | ICD-10-CM | POA: Insufficient documentation
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
## 2016-09-09 NOTE — ED Triage Notes (Signed)
Pt complains of right foot pain since tripping Saturday. Pt states she broke same foot ~8 months ago.

## 2016-09-10 NOTE — ED Notes (Signed)
Pt reports stepping on a rock, twisting her R ankle which she had broken about a year ago.  Swelling noted to lateral R ankle.

## 2016-09-10 NOTE — Discharge Instructions (Signed)
Use ice on your foot and ankle is often as you can, 20 minutes on, 20 minutes off and be sure to keep a thin cloth between your skin and the ice. Elevate your foot to help with swelling. Take Tylenol as needed for pain. Follow-up with your primary care provider or orthopedics in 3-4 days if symptoms are not improving. Keep the brace on at all times except to shower. Use crutches as needed.  Return immediately to the emergency department if you experience worsening pain, worsening swelling, blueness of your toes, swelling or pain going into your calf, worsening numbness, fever or any other concerning symptoms.

## 2016-09-17 ENCOUNTER — Telehealth (HOSPITAL_BASED_OUTPATIENT_CLINIC_OR_DEPARTMENT_OTHER): Payer: Self-pay | Admitting: Emergency Medicine

## 2017-01-14 ENCOUNTER — Emergency Department (HOSPITAL_COMMUNITY): Payer: Self-pay

## 2017-01-14 ENCOUNTER — Inpatient Hospital Stay (HOSPITAL_COMMUNITY)
Admission: EM | Admit: 2017-01-14 | Discharge: 2017-01-16 | DRG: 897 | Disposition: A | Discharge status: Home / Self Care | Payer: Self-pay | Attending: Internal Medicine | Admitting: Internal Medicine

## 2017-01-14 ENCOUNTER — Encounter (HOSPITAL_COMMUNITY): Payer: Self-pay | Admitting: *Deleted

## 2017-01-14 DIAGNOSIS — E7220 Disorder of urea cycle metabolism, unspecified: Secondary | ICD-10-CM | POA: Diagnosis present

## 2017-01-14 DIAGNOSIS — K709 Alcoholic liver disease, unspecified: Secondary | ICD-10-CM | POA: Diagnosis present

## 2017-01-14 DIAGNOSIS — M79671 Pain in right foot: Secondary | ICD-10-CM | POA: Diagnosis present

## 2017-01-14 DIAGNOSIS — R17 Unspecified jaundice: Secondary | ICD-10-CM

## 2017-01-14 DIAGNOSIS — D539 Nutritional anemia, unspecified: Secondary | ICD-10-CM | POA: Diagnosis present

## 2017-01-14 DIAGNOSIS — F1020 Alcohol dependence, uncomplicated: Secondary | ICD-10-CM | POA: Diagnosis present

## 2017-01-14 DIAGNOSIS — K219 Gastro-esophageal reflux disease without esophagitis: Secondary | ICD-10-CM | POA: Diagnosis present

## 2017-01-14 DIAGNOSIS — Z886 Allergy status to analgesic agent status: Secondary | ICD-10-CM

## 2017-01-14 DIAGNOSIS — Z1611 Resistance to penicillins: Secondary | ICD-10-CM | POA: Diagnosis present

## 2017-01-14 DIAGNOSIS — K7011 Alcoholic hepatitis with ascites: Secondary | ICD-10-CM | POA: Diagnosis present

## 2017-01-14 DIAGNOSIS — R319 Hematuria, unspecified: Secondary | ICD-10-CM | POA: Diagnosis present

## 2017-01-14 DIAGNOSIS — Z87891 Personal history of nicotine dependence: Secondary | ICD-10-CM

## 2017-01-14 DIAGNOSIS — D649 Anemia, unspecified: Secondary | ICD-10-CM | POA: Diagnosis present

## 2017-01-14 DIAGNOSIS — N390 Urinary tract infection, site not specified: Secondary | ICD-10-CM | POA: Diagnosis present

## 2017-01-14 DIAGNOSIS — Z5329 Procedure and treatment not carried out because of patient's decision for other reasons: Secondary | ICD-10-CM

## 2017-01-14 DIAGNOSIS — M549 Dorsalgia, unspecified: Secondary | ICD-10-CM

## 2017-01-14 DIAGNOSIS — M25571 Pain in right ankle and joints of right foot: Secondary | ICD-10-CM | POA: Diagnosis present

## 2017-01-14 DIAGNOSIS — Z5320 Procedure and treatment not carried out because of patient's decision for unspecified reasons: Secondary | ICD-10-CM

## 2017-01-14 DIAGNOSIS — Z91011 Allergy to milk products: Secondary | ICD-10-CM

## 2017-01-14 DIAGNOSIS — F10239 Alcohol dependence with withdrawal, unspecified: Principal | ICD-10-CM | POA: Diagnosis present

## 2017-01-14 DIAGNOSIS — R000 Tachycardia, unspecified: Secondary | ICD-10-CM | POA: Diagnosis present

## 2017-01-14 DIAGNOSIS — B9620 Unspecified Escherichia coli [E. coli] as the cause of diseases classified elsewhere: Secondary | ICD-10-CM | POA: Diagnosis present

## 2017-01-14 DIAGNOSIS — K760 Fatty (change of) liver, not elsewhere classified: Secondary | ICD-10-CM | POA: Diagnosis present

## 2017-01-14 DIAGNOSIS — M545 Low back pain: Secondary | ICD-10-CM | POA: Diagnosis present

## 2017-01-14 DIAGNOSIS — F419 Anxiety disorder, unspecified: Secondary | ICD-10-CM | POA: Diagnosis present

## 2017-01-14 LAB — COMPREHENSIVE METABOLIC PANEL
ALK PHOS: 151 U/L — AB (ref 38–126)
ALT: 22 U/L (ref 14–54)
ANION GAP: 9 (ref 5–15)
AST: 105 U/L — ABNORMAL HIGH (ref 15–41)
Albumin: 2.7 g/dL — ABNORMAL LOW (ref 3.5–5.0)
BILIRUBIN TOTAL: 10.5 mg/dL — AB (ref 0.3–1.2)
BUN: <5 mg/dL — ABNORMAL LOW (ref 6–20)
CALCIUM: 9.1 mg/dL (ref 8.9–10.3)
CO2: 27 mmol/L (ref 22–32)
Chloride: 98 mmol/L — ABNORMAL LOW (ref 101–111)
Creatinine, Ser: 0.5 mg/dL (ref 0.44–1.00)
GFR calc non Af Amer: >60 mL/min (ref >60)
Glucose, Bld: 125 mg/dL — ABNORMAL HIGH (ref 65–99)
POTASSIUM: 4.3 mmol/L (ref 3.5–5.1)
Sodium: 134 mmol/L — ABNORMAL LOW (ref 135–145)
TOTAL PROTEIN: 7.6 g/dL (ref 6.5–8.1)

## 2017-01-14 LAB — CBC WITH DIFFERENTIAL/PLATELET
BAND NEUTROPHILS: 0 %
BASOS PCT: 10 %
BLASTS: 0 %
Basophils Absolute: 0.9 10*3/uL — ABNORMAL HIGH (ref 0.0–0.1)
EOS ABS: 0 10*3/uL (ref 0.0–0.7)
Eosinophils Relative: 0 %
HEMATOCRIT: 33.3 % — AB (ref 36.0–46.0)
HEMOGLOBIN: 11.5 g/dL — AB (ref 12.0–15.0)
LYMPHS PCT: 7 %
Lymphs Abs: 0.7 10*3/uL (ref 0.7–4.0)
MCH: 38.9 pg — AB (ref 26.0–34.0)
MCHC: 34.5 g/dL (ref 30.0–36.0)
MCV: 112.5 fL — ABNORMAL HIGH (ref 78.0–100.0)
MONO ABS: 0.7 10*3/uL (ref 0.1–1.0)
MONOS PCT: 7 %
Metamyelocytes Relative: 0 %
Myelocytes: 0 %
NEUTROS ABS: 7.1 10*3/uL (ref 1.7–7.7)
NEUTROS PCT: 76 %
NRBC: 0 /100{WBCs}
OTHER: 0 %
Platelets: 342 10*3/uL (ref 150–400)
Promyelocytes Absolute: 0 %
RBC: 2.96 MIL/uL — ABNORMAL LOW (ref 3.87–5.11)
RDW: 17.1 % — AB (ref 11.5–15.5)
WBC: 9.4 10*3/uL (ref 4.0–10.5)

## 2017-01-14 LAB — URINALYSIS, ROUTINE W REFLEX MICROSCOPIC
GLUCOSE, UA: NEGATIVE mg/dL
HGB URINE DIPSTICK: NEGATIVE
Ketones, ur: NEGATIVE mg/dL
Leukocytes, UA: NEGATIVE
NITRITE: POSITIVE — AB
PH: 6 (ref 5.0–8.0)
Protein, ur: NEGATIVE mg/dL
SPECIFIC GRAVITY, URINE: 1.004 — AB (ref 1.005–1.030)

## 2017-01-14 LAB — PROTIME-INR
INR: 1.38
PROTHROMBIN TIME: 17.1 s — AB (ref 11.4–15.2)

## 2017-01-14 LAB — RAPID URINE DRUG SCREEN, HOSP PERFORMED
AMPHETAMINES: NOT DETECTED
Barbiturates: NOT DETECTED
Benzodiazepines: POSITIVE — AB
Cocaine: NOT DETECTED
OPIATES: NOT DETECTED
Tetrahydrocannabinol: NOT DETECTED

## 2017-01-14 LAB — ETHANOL: Alcohol, Ethyl (B): <5 mg/dL (ref <5)

## 2017-01-14 LAB — AMMONIA: Ammonia: 49 umol/L — ABNORMAL HIGH (ref 9–35)

## 2017-01-14 LAB — MAGNESIUM: MAGNESIUM: 1.6 mg/dL — AB (ref 1.7–2.4)

## 2017-01-14 LAB — APTT: APTT: 41 s — AB (ref 24–36)

## 2017-01-14 LAB — ACETAMINOPHEN LEVEL

## 2017-01-14 LAB — I-STAT BETA HCG BLOOD, ED (MC, WL, AP ONLY)

## 2017-01-14 MED ORDER — OXYCODONE HCL 5 MG PO TABS
5.0000 mg | ORAL_TABLET | Freq: Once | ORAL | Status: AC
  Administered 2017-01-14: 5 mg via ORAL
  Filled 2017-01-14: qty 1

## 2017-01-14 MED ORDER — SODIUM CHLORIDE 0.9 % IV BOLUS (SEPSIS)
1000.0000 mL | Freq: Once | INTRAVENOUS | Status: AC
  Administered 2017-01-14: 1000 mL via INTRAVENOUS

## 2017-01-14 MED ORDER — LORAZEPAM 2 MG/ML IJ SOLN
1.0000 mg | Freq: Once | INTRAMUSCULAR | Status: AC
  Administered 2017-01-14: 1 mg via INTRAVENOUS
  Filled 2017-01-14: qty 1

## 2017-01-14 MED ORDER — THIAMINE HCL 100 MG/ML IJ SOLN
100.0000 mg | Freq: Once | INTRAMUSCULAR | Status: AC
  Administered 2017-01-15: 100 mg via INTRAVENOUS
  Filled 2017-01-14: qty 2
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
## 2017-01-14 NOTE — ED Triage Notes (Signed)
Pt has multiple complaints. Reports pulling a muscle in left leg, injured her back in the shower and twisting right ankle. Now today is having bilateral feet swelling and her "knees are giving out on her." reports difficulty ambulating.

## 2017-01-14 NOTE — ED Notes (Signed)
Patient transported to [facility] 

## 2017-01-15 ENCOUNTER — Observation Stay (HOSPITAL_COMMUNITY): Payer: Self-pay

## 2017-01-15 ENCOUNTER — Encounter (HOSPITAL_COMMUNITY): Payer: Self-pay | Admitting: Internal Medicine

## 2017-01-15 ENCOUNTER — Observation Stay (HOSPITAL_BASED_OUTPATIENT_CLINIC_OR_DEPARTMENT_OTHER): Payer: Self-pay

## 2017-01-15 DIAGNOSIS — D649 Anemia, unspecified: Secondary | ICD-10-CM

## 2017-01-15 DIAGNOSIS — F1020 Alcohol dependence, uncomplicated: Secondary | ICD-10-CM

## 2017-01-15 DIAGNOSIS — K7011 Alcoholic hepatitis with ascites: Secondary | ICD-10-CM | POA: Diagnosis present

## 2017-01-15 DIAGNOSIS — N390 Urinary tract infection, site not specified: Secondary | ICD-10-CM | POA: Diagnosis present

## 2017-01-15 DIAGNOSIS — R9431 Abnormal electrocardiogram [ECG] [EKG]: Secondary | ICD-10-CM

## 2017-01-15 DIAGNOSIS — R000 Tachycardia, unspecified: Secondary | ICD-10-CM

## 2017-01-15 DIAGNOSIS — K709 Alcoholic liver disease, unspecified: Secondary | ICD-10-CM

## 2017-01-15 DIAGNOSIS — E7220 Disorder of urea cycle metabolism, unspecified: Secondary | ICD-10-CM | POA: Diagnosis present

## 2017-01-15 LAB — ECHOCARDIOGRAM COMPLETE
CHL CUP STROKE VOLUME: 39 mL
E decel time: 254 msec
EERAT: 5.99
FS: 33 % (ref 28–44)
Height: 57 in
IVS/LV PW RATIO, ED: 1.14
LA vol A4C: 44.8 ml
LA vol index: 30.1 mL/m2
LADIAMINDEX: 2.23 cm/m2
LASIZE: 36 mm
LAVOL: 48.6 mL
LDCA: 2.84 cm2
LEFT ATRIUM END SYS DIAM: 36 mm
LV E/e' medial: 5.99
LV PW d: 7 mm — AB (ref 0.6–1.1)
LV TDI E'LATERAL: 17.2
LV dias vol index: 36 mL/m2
LV e' LATERAL: 17.2 cm/s
LV sys vol index: 12 mL/m2
LV sys vol: 19 mL (ref 14–42)
LVDIAVOL: 58 mL (ref 46–106)
LVEEAVG: 5.99
LVOT SV: 65 mL
LVOT VTI: 22.9 cm
LVOT diameter: 19 mm
LVOT peak grad rest: 5 mmHg
LVOT peak vel: 117 cm/s
Lateral S' vel: 15.7 cm/s
MV Dec: 254
MV pk E vel: 103 m/s
MVAP: 2.97 cm2
MVPG: 4 mmHg
MVPKAVEL: 84.8 m/s
P 1/2 time: 74 ms
Simpson's disk: 67
TAPSE: 24.2 mm
TDI e' medial: 12
Weight: 2206.4 oz

## 2017-01-15 LAB — VITAMIN B12: Vitamin B-12: 4958 pg/mL — ABNORMAL HIGH (ref 180–914)

## 2017-01-15 LAB — BASIC METABOLIC PANEL
Anion gap: 10 (ref 5–15)
CO2: 22 mmol/L (ref 22–32)
CREATININE: 0.49 mg/dL (ref 0.44–1.00)
Calcium: 7.7 mg/dL — ABNORMAL LOW (ref 8.9–10.3)
Chloride: 103 mmol/L (ref 101–111)
GFR calc Af Amer: >60 mL/min (ref >60)
GLUCOSE: 101 mg/dL — AB (ref 65–99)
Potassium: 3.6 mmol/L (ref 3.5–5.1)
Sodium: 135 mmol/L (ref 135–145)

## 2017-01-15 LAB — TSH: TSH: 9.533 u[IU]/mL — ABNORMAL HIGH (ref 0.350–4.500)

## 2017-01-15 LAB — D-DIMER, QUANTITATIVE: D-Dimer, Quant: 0.95 ug/mL-FEU — ABNORMAL HIGH (ref 0.00–0.50)

## 2017-01-15 LAB — T4, FREE: Free T4: 1.54 ng/dL — ABNORMAL HIGH (ref 0.61–1.12)

## 2017-01-15 LAB — MAGNESIUM: Magnesium: 2.5 mg/dL — ABNORMAL HIGH (ref 1.7–2.4)

## 2017-01-15 LAB — FOLATE: Folate: 7.7 ng/mL (ref >5.9)

## 2017-01-15 MED ORDER — OXYCODONE HCL 5 MG PO TABS
2.5000 mg | ORAL_TABLET | Freq: Four times a day (QID) | ORAL | 0 refills | Status: DC | PRN
Start: 2017-01-15 — End: 2017-01-16

## 2017-01-15 MED ORDER — GI COCKTAIL ~~LOC~~
30.0000 mL | Freq: Once | ORAL | Status: AC
  Administered 2017-01-15: 30 mL via ORAL
  Filled 2017-01-15: qty 30

## 2017-01-15 MED ORDER — CEPHALEXIN 500 MG PO CAPS: 500.0000 mg | ORAL_CAPSULE | Freq: Four times a day (QID) | ORAL | 0 refills | Status: DC

## 2017-01-15 MED ORDER — DEXTROSE 5 % IV SOLN
1.0000 g | INTRAVENOUS | Status: DC
  Administered 2017-01-16: 1 g via INTRAVENOUS
  Filled 2017-01-15: qty 10

## 2017-01-15 MED ORDER — DEXTROSE 5 % IV SOLN
1.0000 g | Freq: Once | INTRAVENOUS | Status: AC
  Administered 2017-01-15: 1 g via INTRAVENOUS
  Filled 2017-01-15: qty 10

## 2017-01-15 MED ORDER — VITAMIN B-1 100 MG PO TABS
100.0000 mg | ORAL_TABLET | Freq: Every day | ORAL | Status: DC
  Administered 2017-01-15 – 2017-01-16 (×2): 100 mg via ORAL
  Filled 2017-01-15 (×2): qty 1

## 2017-01-15 MED ORDER — MAGNESIUM OXIDE 400 (241.3 MG) MG PO TABS
400.0000 mg | ORAL_TABLET | Freq: Once | ORAL | Status: DC
  Filled 2017-01-15: qty 1

## 2017-01-15 MED ORDER — MAGNESIUM SULFATE 4 GM/100ML IV SOLN
4.0000 g | Freq: Once | INTRAVENOUS | Status: AC
  Administered 2017-01-15: 4 g via INTRAVENOUS
  Filled 2017-01-15: qty 100

## 2017-01-15 MED ORDER — THIAMINE HCL 100 MG/ML IJ SOLN: 100.0000 mg | Freq: Every day | INTRAMUSCULAR | Status: DC

## 2017-01-15 MED ORDER — LORAZEPAM 2 MG/ML IJ SOLN
1.0000 mg | Freq: Once | INTRAMUSCULAR | Status: AC
  Administered 2017-01-15: 1 mg via INTRAVENOUS
  Filled 2017-01-15: qty 1

## 2017-01-15 MED ORDER — SODIUM CHLORIDE 0.9 % IV SOLN
INTRAVENOUS | Status: AC
  Administered 2017-01-15: 05:00:00 via INTRAVENOUS

## 2017-01-15 MED ORDER — IOPAMIDOL (ISOVUE-370) INJECTION 76%
INTRAVENOUS | Status: AC
  Filled 2017-01-15: qty 100

## 2017-01-15 MED ORDER — LORAZEPAM 2 MG/ML IJ SOLN: 0.0000 mg | Freq: Two times a day (BID) | INTRAMUSCULAR | Status: DC

## 2017-01-15 MED ORDER — SODIUM CHLORIDE 0.9 % IV BOLUS (SEPSIS)
2000.0000 mL | Freq: Once | INTRAVENOUS | Status: AC
  Administered 2017-01-15: 2000 mL via INTRAVENOUS

## 2017-01-15 MED ORDER — FOLIC ACID 1 MG PO TABS
1.0000 mg | ORAL_TABLET | Freq: Every day | ORAL | Status: DC
  Administered 2017-01-15 – 2017-01-16 (×2): 1 mg via ORAL
  Filled 2017-01-15 (×2): qty 1

## 2017-01-15 MED ORDER — LORAZEPAM 1 MG PO TABS
1.0000 mg | ORAL_TABLET | Freq: Four times a day (QID) | ORAL | Status: DC | PRN
  Administered 2017-01-16: 1 mg via ORAL
  Filled 2017-01-15: qty 1

## 2017-01-15 MED ORDER — KETOROLAC TROMETHAMINE 30 MG/ML IJ SOLN
30.0000 mg | Freq: Once | INTRAMUSCULAR | Status: AC
  Administered 2017-01-15: 30 mg via INTRAVENOUS
  Filled 2017-01-15: qty 1

## 2017-01-15 MED ORDER — LORAZEPAM 2 MG/ML IJ SOLN
0.0000 mg | Freq: Four times a day (QID) | INTRAMUSCULAR | Status: DC
  Administered 2017-01-15 (×2): 2 mg via INTRAVENOUS
  Filled 2017-01-15: qty 1

## 2017-01-15 MED ORDER — LORAZEPAM 2 MG/ML IJ SOLN
1.0000 mg | Freq: Four times a day (QID) | INTRAMUSCULAR | Status: DC | PRN
  Filled 2017-01-15: qty 1

## 2017-01-15 MED ORDER — SODIUM CHLORIDE 0.9% FLUSH
3.0000 mL | Freq: Two times a day (BID) | INTRAVENOUS | Status: DC
  Administered 2017-01-15: 3 mL via INTRAVENOUS

## 2017-01-15 MED ORDER — HYDROCODONE-ACETAMINOPHEN 5-325 MG PO TABS
1.0000 | ORAL_TABLET | ORAL | Status: DC | PRN
  Administered 2017-01-15: 2 via ORAL
  Administered 2017-01-15: 1 via ORAL
  Administered 2017-01-15 (×2): 2 via ORAL
  Administered 2017-01-16: 1 via ORAL
  Filled 2017-01-15: qty 2
  Filled 2017-01-15 (×2): qty 1
  Filled 2017-01-15 (×3): qty 2

## 2017-01-15 MED ORDER — CHLORDIAZEPOXIDE HCL 25 MG PO CAPS: ORAL_CAPSULE | ORAL | 0 refills | Status: DC

## 2017-01-15 MED ORDER — ADULT MULTIVITAMIN W/MINERALS CH
1.0000 | ORAL_TABLET | Freq: Every day | ORAL | Status: DC
  Administered 2017-01-15 – 2017-01-16 (×2): 1 via ORAL
  Filled 2017-01-15 (×2): qty 1

## 2017-01-15 NOTE — Evaluation (Signed)
=Physical Therapy Evaluation Patient Details Name: Jennifer Moore MRN: [id number] DOB: May 26, 1981 Today's Date: 01/15/2017   History of Present Illness  36 year old female with anxiety, depression, alcohol dependence, substance abuse presented with back pain, left leg pull muscle and "twisted" right ankle while in the shower. She mentions that pain is not allowing her to walk without significant discomfort. She mentions history of bilateral feet fractures, which has exacerbated the pain on the day of admission.   Clinical Impression  Pt presents with chronic foot/ankle issues limiting mobility and activity at home. Pt appears at baseline level of functioning as she notes "I'm not very active, but I try to do as much as I can".  Pt able to perform all mobility independently and able to perform gait with antalgic pattern but without LOB.  Pt educated on importance of continuing seated and supine therex to assist with decreasing swelling in ankles, pt verbalizes understanding.  Pt with no further acute PT needs at this time.    Follow Up Recommendations No PT follow up    Equipment Recommendations  None recommended by PT    Recommendations for Other Services       Precautions / Restrictions Precautions Precautions: None Restrictions Weight Bearing Restrictions: No      Mobility  Bed Mobility Overal bed mobility: Independent                Transfers Overall transfer level: Independent                  Ambulation/Gait Ambulation/Gait assistance: Modified independent (Device/Increase time) Ambulation Distance (Feet): 25 Feet Assistive device: None       General Gait Details: pt with antalgic gait, decreased ankle PF/DF with gait, able to increase stride length with cues, decreased cadence  Stairs            Wheelchair Mobility    Modified Rankin (Stroke Patients Only)       Balance                                              Pertinent Vitals/Pain Pain Assessment: No/denies pain (pt states her feet feel "funny", but no pain)    Home Living Family/patient expects to be discharged to:: Private residence Living Arrangements: Non-relatives/Friends Available Help at Discharge: Friend(s) Type of Home: House       Home Layout: One level Home Equipment: None      Prior Function Level of Independence: Independent               Hand Dominance        Extremity/Trunk Assessment   Upper Extremity Assessment Upper Extremity Assessment: Overall WFL for tasks assessed    Lower Extremity Assessment Lower Extremity Assessment:  (bilat ankle weakness 3-/5, hips and knees WFL bilat)    Cervical / Trunk Assessment Cervical / Trunk Assessment: Normal  Communication   Communication: No difficulties  Cognition Arousal/Alertness: Awake/alert Behavior During Therapy: WFL for tasks assessed/performed Overall Cognitive Status: Within Functional Limits for tasks assessed                      General Comments General comments (skin integrity, edema, etc.): no LOB during mobility tasks    Exercises     Assessment/Plan    PT Assessment Patent does not need any further PT services  PT Problem  List         PT Treatment Interventions      PT Goals (Current goals can be found in the Care Plan section)  Acute Rehab PT Goals PT Goal Formulation: All assessment and education complete, DC therapy    Frequency     Barriers to discharge        Co-evaluation               End of Session   Activity Tolerance: Patient tolerated treatment well Patient left: in bed;with call bell/phone within reach;with SCD's reapplied Nurse Communication: Mobility status PT Visit Diagnosis: Other abnormalities of gait and mobility (R26.89)    Functional Assessment Tool Used: AM-PAC 6 Clicks Basic Mobility Functional Limitation: Mobility: Walking and moving around Mobility: Walking and Moving  Around Current Status (Z6109): At least 20 percent but less than 40 percent impaired, limited or restricted Mobility: Walking and Moving Around Goal Status [phone]): At least 20 percent but less than 40 percent impaired, limited or restricted Mobility: Walking and Moving Around Discharge Status [phone]): At least 20 percent but less than 40 percent impaired, limited or restricted    Time: 9147-8295 PT Time Calculation (min) (ACUTE ONLY): 17 min   Charges:   PT Evaluation $PT Eval Moderate Complexity: 1 Procedure     PT G Codes:   PT G-Codes **NOT FOR INPATIENT CLASS** Functional Assessment Tool Used: AM-PAC 6 Clicks Basic Mobility Functional Limitation: Mobility: Walking and moving around Mobility: Walking and Moving Around Current Status (A2130): At least 20 percent but less than 40 percent impaired, limited or restricted Mobility: Walking and Moving Around Goal Status [phone]): At least 20 percent but less than 40 percent impaired, limited or restricted Mobility: Walking and Moving Around Discharge Status [phone]): At least 20 percent but less than 40 percent impaired, limited or restricted     Jachai Okazaki 01/15/2017, 1:26 PM

## 2017-01-15 NOTE — Progress Notes (Addendum)
[facility]                                                                              Patient Demographics  Jennifer Moore, is a 36 y.o. female, DOB - 1981-08-03, ZOX:[id number]  Admit date - 01/14/2017   Admitting Physician Bobette Moavid Manuel Ortiz, MD  Outpatient Primary MD for the patient is Sharion SettlerHILTON,SUZANNE, MD  Outpatient specialists:   LOS - 0  days    Chief Complaint  Patient presents with  . Back Pain       Brief summary   Patient is a 36 year old female with anxiety, depression, alcohol dependence, substance abuse presented with back pain, left leg pull muscle and "twisted" right ankle while in the shower. She mentions that pain is not allowing him to walk without significant discomfort. She mentions history of bilateral feet fractures, which has exacerbated the pain on the day of admission. She has a history of chronic alcohol use and stated that she last used alcohol this afternoon. She complained of increased frequency and occasional dysuria, although does not have dysuria this time.   Assessment & Plan     Principal Problem:  sinus tachycardia -Possibly due to alcohol withdrawals, anxiety - TSH 9.5,   obtain free T4, T3, d-dimer - Continue IV hydration, follow 2-D echo, EKG reviewed, sinus tachycardia   -Urine tox screen with benzodiazepines and alcohol level less than 5  Addendum Ddimer 0.95: obtain CTA chest to r/o PE   Active Problems:   Alcohol use disorder, severe, dependence (HCC) - Continue alcohol withdrawal protocol with CIWA - Continue thiamine, folic acid, multivitamin      Alcoholic liver disease (HCC) Child-Pugh Score of 10. Advised the patient of high mortality rate if cessation is not achieved.    macrocytic anemia  - Likely due to alcohol abuse  - Obtain B12, folate       Hyperammonemia (HCC) The patient is lactose intolerant, follow up ammonia level as needed Currently alert and oriented       UTI (urinary tract  infection) Continue Rocephin Follow up  urine culture and sensitivities      right ankle pain and foot pain and back pain  - Right ankle and foot x-rays reviewed, no fracture or dislocation  - Lumbar spine x-ray negative - Obtain PT evaluation     Hypomagnesemia Replaced.   Code Status:Full CODE STATUS DVT Prophylaxis: SCD's Family Communication: Discussed in detail with the patient, all imaging results, lab results explained to the patient    Disposition Plan: DC in am if medically improved   Time Spent in minutes  25 minutes  Procedures:    Consultants:   None   Antimicrobials:   IV Rocephin   Medications  Scheduled Meds: . [START ON 01/16/2017] cefTRIAXone (ROCEPHIN) IVPB 1 gram/50 mL D5W  1 g Intravenous Q24H  . folic acid  1 mg Oral Daily  . LORazepam  0-4 mg Intravenous Q6H   Followed by  . [START ON 01/17/2017] LORazepam  0-4 mg Intravenous Q12H  . multivitamin with minerals  1 tablet Oral Daily  . sodium chloride flush  3 mL Intravenous Q12H  .  thiamine  100 mg Oral Daily   Or  . thiamine  100 mg Intravenous Daily   Continuous Infusions: . sodium chloride 100 mL/hr at 01/15/17 0504   PRN Meds:.HYDROcodone-acetaminophen, LORazepam **OR** LORazepam   Antibiotics   Anti-infectives    Start     Dose/Rate Route Frequency Ordered Stop   01/16/17 0000  cefTRIAXone (ROCEPHIN) 1 g in dextrose 5 % 50 mL IVPB     1 g 100 mL/hr over 30 Minutes Intravenous Every 24 hours 01/15/17 0858     01/15/17 0030  cefTRIAXone (ROCEPHIN) 1 g in dextrose 5 % 50 mL IVPB     1 g 100 mL/hr over 30 Minutes Intravenous  Once 01/15/17 0026 01/15/17 0120   01/15/17 0000  cephALEXin (KEFLEX) 500 MG capsule     500 mg Oral 4 times daily 01/15/17 0056          Subjective:   Jennifer Moore was seen and examined today. Complaining of back pain, 4/10, still somewhat anxious.   Patient denies dizziness, chest pain, shortness of breath, abdominal pain, N/V/D/C, new weakness,  numbess, tingling.   Objective:   Vitals:   01/15/17 0329 01/15/17 0330 01/15/17 0411 01/15/17 0858  BP:  102/74 108/76   Pulse:  116 (!) 119 (!) 114  Resp:  20 20   Temp:   98.7 F (37.1 C)   TempSrc:   Oral   SpO2: 96% 94% 95%   Weight:   62.6 kg (137 lb 14.4 oz)   Height:   4\' 9"  (1.448 m)     Intake/Output Summary (Last 24 hours) at 01/15/17 1014 Last data filed at 01/15/17 0900  Gross per 24 hour  Intake              600 ml  Output              200 ml  Net              400 ml     Wt Readings from Last 3 Encounters:  01/15/17 62.6 kg (137 lb 14.4 oz)  08/18/15 54.4 kg (120 lb)  07/17/15 54.4 kg (120 lb)     Exam  General: Alert and oriented x 3, NAD, Appears somewhat anxious  HEENT:    Neck: Supple, no JVD, no masses  Cardiovascular: S1 S2 auscultated, no rubs, murmurs or gallops. Regular rate and rhythm.  Respiratory: Clear to auscultation bilaterally, no wheezing, rales or rhonchi  Gastrointestinal: Soft, nontender, nondistended, + bowel sounds  Ext: no cyanosis clubbing or edema  Neuro: AAOx3, Cr N's II- XII. Strength 5/5 upper and lower extremities bilaterally  Skin: No rashes  Psych: Normal affect and demeanor, alert and oriented x3    Data Reviewed:  I have personally reviewed following labs and imaging studies  Micro Results No results found for this or any previous visit (from the past 240 hour(s)).  Radiology Reports Dg Lumbar Spine 2-3 Views  Result Date: 01/15/2017 CLINICAL DATA:  Back pain EXAM: LUMBAR SPINE - 2-3 VIEW COMPARISON:  None. FINDINGS: There is no evidence of lumbar spine fracture. Alignment is normal. Intervertebral disc spaces are maintained. IMPRESSION: Negative. Electronically Signed   By: Marlan Palau M.D.   On: 01/15/2017 09:48   US Abdomen Limited Ruq  Result Date: 01/15/2017 CLINICAL DATA:  Jaundice EXAM: US ABDOMEN LIMITED - RIGHT UPPER QUADRANT COMPARISON:  CT 08/04/2009 FINDINGS: Gallbladder: Small amount of  echogenic sludge in the gallbladder lumen. Mild wall thickening up to  4 mm. Negative sonographic Murphy's. Common bile duct: Diameter: Normal at 4 mm Liver: Heterogenous echogenic echotexture.  No focal hepatic abnormality. IMPRESSION: 1. Small amount of gallbladder sludge. Mild nonspecific wall thickening. Negative sonographic Murphy's and no biliary dilatation. 2. Coarse heterogeneous echogenic liver, suggesting fatty infiltration. Electronically Signed   By: Jasmine Pang M.D.   On: 01/15/2017 00:08    Lab Data:  CBC:  Recent Labs Lab 01/14/17 2143  WBC 9.4  NEUTROABS 7.1  HGB 11.5*  HCT 33.3*  MCV 112.5*  PLT 342   Basic Metabolic Panel:  Recent Labs Lab 01/14/17 2143 01/14/17 2151 01/15/17 0828  NA 134*  --  135  K 4.3  --  3.6  CL 98*  --  103  CO2 27  --  22  GLUCOSE 125*  --  101*  BUN <5*  --  <5*  CREATININE 0.50  --  0.49  CALCIUM 9.1  --  7.7*  MG  --  1.6* 2.5*   GFR: Estimated Creatinine Clearance: 75.4 mL/min (by C-G formula based on SCr of 0.49 mg/dL). Liver Function Tests:  Recent Labs Lab 01/14/17 2143  AST 105*  ALT 22  ALKPHOS 151*  BILITOT 10.5*  PROT 7.6  ALBUMIN 2.7*   No results for input(s): LIPASE, AMYLASE in the last 168 hours.  Recent Labs Lab 01/14/17 2145  AMMONIA 49*   Coagulation Profile:  Recent Labs Lab 01/14/17 2145  INR 1.38   Cardiac Enzymes: No results for input(s): CKTOTAL, CKMB, CKMBINDEX, TROPONINI in the last 168 hours. BNP (last 3 results) No results for input(s): PROBNP in the last 8760 hours. HbA1C: No results for input(s): HGBA1C in the last 72 hours. CBG: No results for input(s): GLUCAP in the last 168 hours. Lipid Profile: No results for input(s): CHOL, HDL, LDLCALC, TRIG, CHOLHDL, LDLDIRECT in the last 72 hours. Thyroid Function Tests:  Recent Labs  01/15/17 0828  TSH 9.533*   Anemia Panel: No results for input(s): VITAMINB12, FOLATE, FERRITIN, TIBC, IRON, RETICCTPCT in the last 72  hours. Urine analysis:    Component Value Date/Time   COLORURINE AMBER (A) 01/14/2017 2018   APPEARANCEUR CLEAR 01/14/2017 2018   LABSPEC 1.004 (L) 01/14/2017 2018   PHURINE 6.0 01/14/2017 2018   GLUCOSEU NEGATIVE 01/14/2017 2018   HGBUR NEGATIVE 01/14/2017 2018   BILIRUBINUR SMALL (A) 01/14/2017 2018   BILIRUBINUR negative 01/26/2014 1950   BILIRUBINUR n 12/09/2013 0849   KETONESUR NEGATIVE 01/14/2017 2018   PROTEINUR NEGATIVE 01/14/2017 2018   UROBILINOGEN 0.2 01/26/2014 1950   UROBILINOGEN 0.2 12/29/2009 2338   NITRITE POSITIVE (A) 01/14/2017 2018   LEUKOCYTESUR NEGATIVE 01/14/2017 2018     Cambell Stanek M.D. [facility] 01/15/2017, 10:14 AM  Pager: [phone] Between 7am to 7pm - call Pager - [phone]  After 7pm go to [email or web address] - password [email or web address]  Call night coverage person covering after 7pm
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
## 2017-01-15 NOTE — Discharge Instructions (Signed)
It is critically important that you don't drink any alcohol or take any acetaminophen (also called Tylenol) or acetaminophen containing over-the-counter medications.  Your leaving AGAINST MEDICAL ADVICE, you are welcome to change her mind and return to the emergency department or call 911 at any time for appropriate management of your liver problems.

## 2017-01-15 NOTE — ED Notes (Signed)
Pt requesting to speak with [facility] again [facility] Joni ReiningNicole at bedside

## 2017-01-15 NOTE — Progress Notes (Signed)
*  PRELIMINARY RESULTS* Echocardiogram 2D Echocardiogram has been performed.  Stacey DrainWhite, Johnathan Tortorelli J 01/15/2017, 2:45 PM

## 2017-01-16 DIAGNOSIS — N390 Urinary tract infection, site not specified: Secondary | ICD-10-CM

## 2017-01-16 LAB — BASIC METABOLIC PANEL WITH GFR
Anion gap: 9 (ref 5–15)
BUN: <5 mg/dL — ABNORMAL LOW (ref 6–20)
CO2: 23 mmol/L (ref 22–32)
Calcium: 8.6 mg/dL — ABNORMAL LOW (ref 8.9–10.3)
Chloride: 101 mmol/L (ref 101–111)
Creatinine, Ser: 0.39 mg/dL — ABNORMAL LOW (ref 0.44–1.00)
GFR calc Af Amer: >60 mL/min
GFR calc non Af Amer: >60 mL/min
Glucose, Bld: 67 mg/dL (ref 65–99)
Potassium: 4 mmol/L (ref 3.5–5.1)
Sodium: 133 mmol/L — ABNORMAL LOW (ref 135–145)

## 2017-01-16 LAB — HEPATITIS PANEL, ACUTE
HCV Ab: 0.1 s/co ratio (ref 0.0–0.9)
HEP B S AG: NEGATIVE
Hep A IgM: NEGATIVE
Hep B C IgM: NEGATIVE

## 2017-01-16 LAB — CBC
HEMATOCRIT: 29.1 % — AB (ref 36.0–46.0)
Hemoglobin: 9.9 g/dL — ABNORMAL LOW (ref 12.0–15.0)
MCH: 38.8 pg — ABNORMAL HIGH (ref 26.0–34.0)
MCHC: 34 g/dL (ref 30.0–36.0)
MCV: 114.1 fL — ABNORMAL HIGH (ref 78.0–100.0)
Platelets: 308 10*3/uL (ref 150–400)
RBC: 2.55 MIL/uL — ABNORMAL LOW (ref 3.87–5.11)
RDW: 16.6 % — AB (ref 11.5–15.5)
WBC: 9.5 10*3/uL (ref 4.0–10.5)

## 2017-01-16 LAB — T3: T3 TOTAL: 78 ng/dL (ref 71–180)

## 2017-01-16 LAB — PATHOLOGIST SMEAR REVIEW

## 2017-01-16 MED ORDER — ALPRAZOLAM 0.5 MG PO TABS: 0.5000 mg | ORAL_TABLET | Freq: Every evening | ORAL | 0 refills | Status: AC | PRN

## 2017-01-16 MED ORDER — FOLIC ACID 1 MG PO TABS: 1.0000 mg | ORAL_TABLET | Freq: Every day | ORAL | 0 refills | Status: AC

## 2017-01-16 MED ORDER — CYCLOBENZAPRINE HCL 10 MG PO TABS: 5.0000 mg | ORAL_TABLET | Freq: Three times a day (TID) | ORAL | Status: DC | PRN

## 2017-01-16 MED ORDER — TRAMADOL HCL 50 MG PO TABS: 50.0000 mg | ORAL_TABLET | Freq: Three times a day (TID) | ORAL | 0 refills | Status: AC | PRN

## 2017-01-16 MED ORDER — CYCLOBENZAPRINE HCL 5 MG PO TABS: 5.0000 mg | ORAL_TABLET | Freq: Three times a day (TID) | ORAL | 0 refills | Status: AC | PRN

## 2017-01-16 MED ORDER — CEPHALEXIN 500 MG PO CAPS: 500.0000 mg | ORAL_CAPSULE | Freq: Two times a day (BID) | ORAL | 0 refills | Status: AC

## 2017-01-16 MED ORDER — THIAMINE HCL 100 MG PO TABS: 100.0000 mg | ORAL_TABLET | Freq: Every day | ORAL | 3 refills | Status: AC

## 2017-01-16 NOTE — Care Management Note (Signed)
Case Management Note Donn PieriniKristi Drelyn Pistilli RN, BSN Unit 2W-Case Manager [phone]  Patient Details  Name: Jennifer Moore MRN: [id number] Date of Birth: 26-Apr-1981  Subjective/Objective:   Pt presented with leg and back pain, tachycardia, hx of ETOH                 Action/Plan: PTA pt lived at home, PCP-  Sharion SettlerSuzanne Hilton at [facility], referral received  for GI referral- MD has given pt # to [place] to call and schedule appointment for f/u.   Expected Discharge Date:  01/16/17               Expected Discharge Plan:  Home/Self Care  In-House Referral:     Discharge planning Services  CM Consult  Post Acute Care Choice:  NA Choice offered to:  NA  DME Arranged:    DME Agency:     HH Arranged:    HH Agency:     Status of Service:  Completed, signed off  If discussed at Long Length of Stay Meetings, dates discussed:    Discharge Disposition: home/self care   Additional Comments:  Darrold SpanWebster, Linkon Siverson Hall, RN 01/16/2017, 10:24 AM

## 2017-01-16 NOTE — Progress Notes (Signed)
Discharge instructions (including medications) discussed with and copy provided to patient/caregiver 

## 2017-01-16 NOTE — Plan of Care (Signed)
Problem: Health Behavior/Discharge Planning: Goal: Ability to identify changes in lifestyle to reduce recurrence of condition will improve Outcome: Not Progressing Will need reinforcement.

## 2017-01-16 NOTE — Discharge Summary (Addendum)
Physician Discharge Summary   Patient ID: Jennifer Moore MRN: [id number] DOB/AGE: [birth year] 35 y.o.  Admit date: 01/14/2017 Discharge date: 01/16/2017  Primary Care Physician:  Sharion SettlerHILTON,SUZANNE, MD  Discharge Diagnoses:    . sinus Tachycardia  . Alcohol use disorder, severe, dependence (HCC) with withdrawal . chronic Anemia . Hyperammonemia (HCC) . Alcoholic liver disease (HCC) . UTI (urinary tract infection) . Hypomagnesemia   Consults:  None   Recommendations for Outpatient Follow-up:  1. Please repeat CBC/BMET at next visit   DIET: Heart healthy diet    Allergies:   Allergies  Allergen Reactions  . Ibuprofen Nausea And Vomiting  . Lactose Intolerance (Gi) Diarrhea and Other (See Comments)     DISCHARGE MEDICATIONS: Current Discharge Medication List    START taking these medications   Details  cephALEXin (KEFLEX) 500 MG capsule Take 1 capsule (500 mg total) by mouth 2 (two) times daily. Qty: 6 capsule, Refills: 0    cyclobenzaprine (FLEXERIL) 5 MG tablet Take 1 tablet (5 mg total) by mouth 3 (three) times daily as needed for muscle spasms. Qty: 30 tablet, Refills: 0    folic acid (FOLVITE) 1 MG tablet Take 1 tablet (1 mg total) by mouth daily. Qty: 30 tablet, Refills: 0    thiamine 100 MG tablet Take 1 tablet (100 mg total) by mouth daily. Qty: 30 tablet, Refills: 3    traMADol (ULTRAM) 50 MG tablet Take 1 tablet (50 mg total) by mouth every 8 (eight) hours as needed for severe pain. Qty: 30 tablet, Refills: 0      CONTINUE these medications which have CHANGED   Details  ALPRAZolam (XANAX) 0.5 MG tablet Take 1 tablet (0.5 mg total) by mouth at bedtime as needed for anxiety. Qty: 10 tablet, Refills: 0      CONTINUE these medications which have NOT CHANGED   Details  albuterol (PROVENTIL HFA;VENTOLIN HFA) 108 (90 BASE) MCG/ACT inhaler Inhale 1 puff into the lungs every 6 (six) hours as needed for wheezing or shortness of breath. Qty: 1 Inhaler,  Refills: 0    OVER THE COUNTER MEDICATION Take 1 capsule by mouth 2 (two) times daily. IBGUARD    ranitidine (ZANTAC) 150 MG tablet Take 150 mg by mouth 2 (two) times daily.         Brief H and P: For complete details please refer to admission H and P, but in brief Patient is a 36 year old female with anxiety, depression, alcohol dependence, substance abuse presented with back pain, left leg pull muscle and "twisted" right ankle while in the shower. She mentions that pain is not allowing him to walk without significant discomfort. She mentions history of bilateral feet fractures, which has exacerbated the pain on the day of admission. She has a history of chronic alcohol use and stated that she last used alcohol this afternoon. She complained of increased frequency and occasional dysuria, although does not have dysuria this time.  Hospital Course:  Sinus tachycardia -Likely due to anxiety and alcohol withdrawal.  - TSH 9.5, however T4 slightly elevated at 1.5, T3 78. - D-dimer was slightly elevated at 0.95, CT angiogram of the chest was negative for any pulmonary embolism. - The patient was placed on IV fluid hydration. EKG showed no abnormal rhythms, showed sinus tachycardia. -Urine tox screen positive for benzodiazepines and alcohol level less than 5  - 2-D echo showed EF of 60-65%, normal study.  Alcohol use disorder, severe, dependence (HCC) with withdrawal - Patient was placed on alcohol withdrawal  protocol with CIWA, no acute withdrawals or hallucinations. - Patient was discharged on thiamine and folic acid. Patient was strongly counseled to quit alcohol.   Alcoholic liver disease (HCC) Child-Pugh Score of 10.Advised the patient of high mortality rate if cessation is not achieved.   macrocytic anemia  - Likely due to alcohol abuse  - Obtain B12, folate   Hyperammonemia (HCC) The patient is lactose intolerant, follow up ammonia level as needed. Currently alert and  oriented   UTI (urinary tract infection) Patient was placed on IV Rocephin, urine culture in process. Patient was discharged on oral Keflex for 3 days.   right ankle pain and foot pain and back pain  - Right ankle and foot x-rays reviewed, no fracture or dislocation  - Lumbar spine x-ray negative - PT evaluation recommended no PT follow-up. Patient was given prescription for Flexeril and tramadol as needed.  Hypomagnesemia Replaced.  Day of Discharge BP (!) 100/59 (BP Location: Left Arm)   Pulse (!) 113   Temp 98.1 F (36.7 C) (Oral)   Resp 18   Ht 4\' 9"  (1.448 m)   Wt 62.6 kg (137 lb 14.4 oz)   SpO2 99%   BMI 29.84 kg/m   Physical Exam: General: Alert and awake oriented x3 not in any acute distress. HEENT: anicteric sclera, pupils reactive to light and accommodation CVS: S1-S2 clear no murmur rubs or gallops Chest: clear to auscultation bilaterally, no wheezing rales or rhonchi Abdomen: soft nontender, nondistended, normal bowel sounds Extremities: no cyanosis, clubbing or edema noted bilaterally Neuro: Cranial nerves II-XII intact, no focal neurological deficits   The results of significant diagnostics from this hospitalization (including imaging, microbiology, ancillary and laboratory) are listed below for reference.    LAB RESULTS: Basic Metabolic Panel:  Recent Labs Lab 01/15/17 0828 01/16/17 0325  NA 135 133*  K 3.6 4.0  CL 103 101  CO2 22 23  GLUCOSE 101* 67  BUN <5* <5*  CREATININE 0.49 0.39*  CALCIUM 7.7* 8.6*  MG 2.5*  --    Liver Function Tests:  Recent Labs Lab 01/14/17 2143  AST 105*  ALT 22  ALKPHOS 151*  BILITOT 10.5*  PROT 7.6  ALBUMIN 2.7*   No results for input(s): LIPASE, AMYLASE in the last 168 hours.  Recent Labs Lab 01/14/17 2145  AMMONIA 49*   CBC:  Recent Labs Lab 01/14/17 2143 01/16/17 0325  WBC 9.4 9.5  NEUTROABS 7.1  --   HGB 11.5* 9.9*  HCT 33.3* 29.1*  MCV 112.5* 114.1*  PLT 342 308   Cardiac  Enzymes: No results for input(s): CKTOTAL, CKMB, CKMBINDEX, TROPONINI in the last 168 hours. BNP: Invalid input(s): POCBNP CBG: No results for input(s): GLUCAP in the last 168 hours.  Significant Diagnostic Studies:  Dg Lumbar Spine 2-3 Views  Result Date: 01/15/2017 CLINICAL DATA:  Back pain EXAM: LUMBAR SPINE - 2-3 VIEW COMPARISON:  None. FINDINGS: There is no evidence of lumbar spine fracture. Alignment is normal. Intervertebral disc spaces are maintained. IMPRESSION: Negative. Electronically Signed   By: Marlan Palau M.D.   On: 01/15/2017 09:48   Ct Angio Chest Pe W Or Wo Contrast  Result Date: 01/15/2017 CLINICAL DATA:  Tachycardia and shortness of breath. Clinical suspicion for pulmonary embolism. EXAM: CT ANGIOGRAPHY CHEST WITH CONTRAST TECHNIQUE: Multidetector CT imaging of the chest was performed using the standard protocol during bolus administration of intravenous contrast. Multiplanar CT image reconstructions and MIPs were obtained to evaluate the vascular anatomy. CONTRAST:  100 mL  Isovue 370 COMPARISON:  None. FINDINGS: Cardiovascular: Satisfactory opacification of pulmonary arteries noted, and no pulmonary emboli identified. No evidence of thoracic aortic dissection or aneurysm. Mediastinum/Nodes: No masses or pathologically enlarged lymph nodes identified. Lungs/Pleura: No pulmonary mass, infiltrate, or effusion. Upper abdomen: Severe diffuse hepatic steatosis. Musculoskeletal: No suspicious bone lesions or other significant abnormality identified. Review of the MIP images confirms the above findings. IMPRESSION: No evidence of pulmonary embolism or other active disease within the thorax. Severe hepatic steatosis. Electronically Signed   By: Myles Rosenthal M.D.   On: 01/15/2017 18:44   US Abdomen Limited Ruq  Result Date: 01/15/2017 CLINICAL DATA:  Jaundice EXAM: US ABDOMEN LIMITED - RIGHT UPPER QUADRANT COMPARISON:  CT 08/04/2009 FINDINGS: Gallbladder: Small amount of echogenic  sludge in the gallbladder lumen. Mild wall thickening up to 4 mm. Negative sonographic Murphy's. Common bile duct: Diameter: Normal at 4 mm Liver: Heterogenous echogenic echotexture.  No focal hepatic abnormality. IMPRESSION: 1. Small amount of gallbladder sludge. Mild nonspecific wall thickening. Negative sonographic Murphy's and no biliary dilatation. 2. Coarse heterogeneous echogenic liver, suggesting fatty infiltration. Electronically Signed   By: Jasmine Pang M.D.   On: 01/15/2017 00:08    2D ECHO: Study Conclusions  - Left ventricle: The cavity size was normal. Wall thickness was   normal. Systolic function was normal. The estimated ejection   fraction was in the range of 60% to 65%. Wall motion was normal;   there were no regional wall motion abnormalities. Left   ventricular diastolic function parameters were normal. - Aortic valve: There was no stenosis. - Mitral valve: There was no significant regurgitation. - Right ventricle: The cavity size was normal. Systolic function   was normal. - Pulmonary arteries: No complete TR doppler jet so unable to   estimate PA systolic pressure. - Inferior vena cava: The vessel was normal in size. The   respirophasic diameter changes were in the normal range (>= 50%),   consistent with normal central venous pressure.  Impressions:  - Normal study.  Disposition and Follow-up: Discharge Instructions    Diet - low sodium heart healthy    Complete by:  As directed    Increase activity slowly    Complete by:  As directed        DISPOSITION:  Home   DISCHARGE FOLLOW-UP Follow-up Information      [facility]. Schedule an appointment as soon as possible for a visit in 2 week(s).   Specialty:  Gastroenterology Why:  for specialist evaluation Contact information: [address] [place] [address] [phone]       Sharion Settler, MD. Schedule an appointment as soon as possible for a  visit in 2 week(s).   Specialty:  Family Medicine Contact information: [address] [place] [address] [phone]            Time spent on Discharge:   Signed:   Kiyla Ringler M.D. [facility] 01/16/2017, 9:58 AM Pager: [phone]  Coding query  ECOLI UTI resistant to ampicillin and ampicillin-sulbactam   Timofey Carandang M.D. [facility] 01/21/2017, 4:59 PM  Pager: [phone]

## 2017-01-17 ENCOUNTER — Encounter (HOSPITAL_COMMUNITY): Payer: Self-pay | Admitting: Internal Medicine

## 2017-01-17 LAB — URINE CULTURE: Culture: >=100000 — AB

## 2017-06-23 ENCOUNTER — Emergency Department (HOSPITAL_COMMUNITY): Payer: Self-pay

## 2017-06-23 ENCOUNTER — Emergency Department (HOSPITAL_COMMUNITY)
Admission: EM | Admit: 2017-06-23 | Discharge: 2017-06-23 | Disposition: A | Discharge status: Home / Self Care | Payer: Self-pay | Attending: Emergency Medicine | Admitting: Emergency Medicine

## 2017-06-23 ENCOUNTER — Encounter (HOSPITAL_COMMUNITY): Payer: Self-pay | Admitting: Emergency Medicine

## 2017-06-23 DIAGNOSIS — Z87891 Personal history of nicotine dependence: Secondary | ICD-10-CM | POA: Insufficient documentation

## 2017-06-23 DIAGNOSIS — Z79899 Other long term (current) drug therapy: Secondary | ICD-10-CM | POA: Insufficient documentation

## 2017-06-23 DIAGNOSIS — M25571 Pain in right ankle and joints of right foot: Secondary | ICD-10-CM | POA: Insufficient documentation

## 2017-06-23 MED ORDER — HYDROCODONE-ACETAMINOPHEN 5-325 MG PO TABS
1.0000 | ORAL_TABLET | Freq: Once | ORAL | Status: AC
  Administered 2017-06-23: 1 via ORAL
  Filled 2017-06-23: qty 1

## 2017-06-23 NOTE — ED Triage Notes (Signed)
Pt. Stated, I was getting out of truck and being short I came down and my rt. Foot twisted. Pain is in my foot and ankle. (right)

## 2017-06-23 NOTE — ED Notes (Signed)
Placed patient into room patient is resting with family in room

## 2017-06-23 NOTE — Discharge Instructions (Signed)
Please use the cam walker and crutches as needed.  Please rest, ice, compress and elevated the affected body part to help with swelling and pain.  Motrin and Tylenol for pain.  Follow-up with the orthopedic doctor if her symptoms do not improve.

## 2017-06-23 NOTE — ED Notes (Signed)
Ortho tech paged  
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
## 2017-06-23 NOTE — Progress Notes (Signed)
Orthopedic Tech Progress Note Patient Details:  Jennifer Moore 11-07-1981 [id number] Applied pediatric cam boot to RLE     [name], [name] 06/23/2017, 4:31 PM

## 2017-06-23 NOTE — ED Notes (Signed)
Ortho tech dispatched to pt room

## 2017-06-23 NOTE — ED Notes (Signed)
Pt back from x-ray.

## 2017-07-02 ENCOUNTER — Ambulatory Visit (INDEPENDENT_AMBULATORY_CARE_PROVIDER_SITE_OTHER): Payer: Self-pay | Admitting: Orthopaedic Surgery

## 2017-07-02 ENCOUNTER — Encounter (INDEPENDENT_AMBULATORY_CARE_PROVIDER_SITE_OTHER): Payer: Self-pay | Admitting: Orthopaedic Surgery

## 2017-07-02 DIAGNOSIS — S93401A Sprain of unspecified ligament of right ankle, initial encounter: Secondary | ICD-10-CM
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
## 2017-07-30 ENCOUNTER — Encounter (HOSPITAL_COMMUNITY): Payer: Self-pay | Admitting: *Deleted

## 2017-07-30 ENCOUNTER — Ambulatory Visit (HOSPITAL_COMMUNITY)
Admission: EM | Admit: 2017-07-30 | Discharge: 2017-07-30 | Disposition: A | Discharge status: Home / Self Care | Payer: Self-pay | Attending: Family Medicine | Admitting: Family Medicine

## 2017-07-30 DIAGNOSIS — R591 Generalized enlarged lymph nodes: Secondary | ICD-10-CM

## 2017-07-30 DIAGNOSIS — G43001 Migraine without aura, not intractable, with status migrainosus: Secondary | ICD-10-CM

## 2017-07-30 MED ORDER — ONDANSETRON 4 MG PO TBDP
ORAL_TABLET | ORAL | Status: AC
  Filled 2017-07-30: qty 2

## 2017-07-30 MED ORDER — DEXAMETHASONE SODIUM PHOSPHATE 10 MG/ML IJ SOLN
INTRAMUSCULAR | Status: AC
  Filled 2017-07-30: qty 1

## 2017-07-30 MED ORDER — KETOROLAC TROMETHAMINE 30 MG/ML IJ SOLN
INTRAMUSCULAR | Status: AC
  Filled 2017-07-30: qty 1

## 2017-07-30 MED ORDER — KETOROLAC TROMETHAMINE 30 MG/ML IJ SOLN
30.0000 mg | Freq: Once | INTRAMUSCULAR | Status: AC
  Administered 2017-07-30: 30 mg via INTRAMUSCULAR

## 2017-07-30 MED ORDER — DOXYCYCLINE HYCLATE 100 MG PO CAPS: 100.0000 mg | ORAL_CAPSULE | Freq: Two times a day (BID) | ORAL | 0 refills | Status: DC

## 2017-07-30 MED ORDER — ONDANSETRON 4 MG PO TBDP
8.0000 mg | ORAL_TABLET | Freq: Once | ORAL | Status: AC
  Administered 2017-07-30: 8 mg via ORAL

## 2017-07-30 MED ORDER — FLUCONAZOLE 200 MG PO TABS: ORAL_TABLET | ORAL | 0 refills | Status: DC

## 2017-07-30 MED ORDER — DEXAMETHASONE SODIUM PHOSPHATE 10 MG/ML IJ SOLN
10.0000 mg | Freq: Once | INTRAMUSCULAR | Status: AC
  Administered 2017-07-30: 10 mg via INTRAMUSCULAR

## 2017-07-30 MED ORDER — BUTALBITAL-APAP-CAFFEINE 50-325-40 MG PO TABS: 1.0000 | ORAL_TABLET | Freq: Four times a day (QID) | ORAL | 0 refills | Status: AC | PRN

## 2017-07-30 NOTE — Discharge Instructions (Signed)
For your lymphadenopathy, since this is present for 2 weeks, we will treat with broad spectrum antibiotics. I have prescribed Doxycycline, one tablet twice a day. If your symptoms persist past 2-3 weeks, you may need blood work, imaging, and urine tests to rule out a more significant cause. Schedule an appointment for primary care in 2-3 weeks for follow up if your lymphnodes are still enlarged and painful.  For your headaches, you have been given Toradol, Decadron, and Zofran in clinic and I have prescribed a short course of Fiorcet. This medicine may cause drowsiness, do not drive while taking.  Finally, if your antibiotics cause a yeast infection, I have written a prescription for Diflucan. If you are not experiencing symptoms, you do not need to take it.
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
## 2017-07-30 NOTE — ED Triage Notes (Signed)
Patient reports she has had intermittent headaches, x unknown amount of time, and noticed bump to left occipital region on hair line. States the bump has grown and is now hard and red, states it started out as one but now is 3.

## 2017-08-10 ENCOUNTER — Emergency Department (HOSPITAL_COMMUNITY): Payer: Self-pay

## 2017-08-10 ENCOUNTER — Encounter (HOSPITAL_COMMUNITY): Payer: Self-pay | Admitting: *Deleted

## 2017-08-10 ENCOUNTER — Emergency Department (HOSPITAL_COMMUNITY)
Admission: EM | Admit: 2017-08-10 | Discharge: 2017-08-10 | Disposition: A | Discharge status: Home / Self Care | Payer: Self-pay | Attending: Emergency Medicine | Admitting: Emergency Medicine

## 2017-08-10 DIAGNOSIS — S20211A Contusion of right front wall of thorax, initial encounter: Secondary | ICD-10-CM | POA: Insufficient documentation

## 2017-08-10 DIAGNOSIS — Z87891 Personal history of nicotine dependence: Secondary | ICD-10-CM | POA: Insufficient documentation

## 2017-08-10 DIAGNOSIS — Z79899 Other long term (current) drug therapy: Secondary | ICD-10-CM | POA: Insufficient documentation

## 2017-08-10 DIAGNOSIS — Y9389 Activity, other specified: Secondary | ICD-10-CM | POA: Insufficient documentation

## 2017-08-10 DIAGNOSIS — Y999 Unspecified external cause status: Secondary | ICD-10-CM | POA: Insufficient documentation

## 2017-08-10 DIAGNOSIS — Y929 Unspecified place or not applicable: Secondary | ICD-10-CM | POA: Insufficient documentation

## 2017-08-10 DIAGNOSIS — W01198A Fall on same level from slipping, tripping and stumbling with subsequent striking against other object, initial encounter: Secondary | ICD-10-CM | POA: Insufficient documentation

## 2017-08-10 LAB — I-STAT BETA HCG BLOOD, ED (MC, WL, AP ONLY): I-stat hCG, quantitative: <5 m[IU]/mL (ref <5)

## 2017-08-10 MED ORDER — HYDROCODONE-ACETAMINOPHEN 5-325 MG PO TABS
2.0000 | ORAL_TABLET | Freq: Once | ORAL | Status: AC
  Administered 2017-08-10: 2 via ORAL
  Filled 2017-08-10: qty 2

## 2017-08-10 MED ORDER — ACETAMINOPHEN 500 MG PO TABS: 1000.0000 mg | ORAL_TABLET | Freq: Four times a day (QID) | ORAL | 0 refills | Status: AC | PRN
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
## 2017-08-10 NOTE — ED Triage Notes (Signed)
Pt reports slipping and falling yesterday. States the armrest hit her right chest and now has severe right upper chest/rib pain. Pain increases with movement and respirations. Pt very anxious at triage.

## 2017-08-10 NOTE — ED Triage Notes (Signed)
Pt Has not done anything for pain at home. Pt has not tried ice or OTC.

## 2017-08-10 NOTE — ED Notes (Signed)
Declined W/C at D/C and was escorted to lobby by RN. 

## 2017-09-25 ENCOUNTER — Encounter (HOSPITAL_COMMUNITY): Payer: Self-pay

## 2017-09-25 ENCOUNTER — Emergency Department (HOSPITAL_COMMUNITY)
Admission: EM | Admit: 2017-09-25 | Discharge: 2017-09-25 | Disposition: A | Discharge status: Home / Self Care | Payer: Self-pay | Attending: Emergency Medicine | Admitting: Emergency Medicine

## 2017-09-25 ENCOUNTER — Emergency Department (HOSPITAL_COMMUNITY): Payer: Self-pay

## 2017-09-25 DIAGNOSIS — Z3202 Encounter for pregnancy test, result negative: Secondary | ICD-10-CM | POA: Insufficient documentation

## 2017-09-25 DIAGNOSIS — R220 Localized swelling, mass and lump, head: Secondary | ICD-10-CM | POA: Insufficient documentation

## 2017-09-25 DIAGNOSIS — Y999 Unspecified external cause status: Secondary | ICD-10-CM | POA: Insufficient documentation

## 2017-09-25 DIAGNOSIS — M542 Cervicalgia: Secondary | ICD-10-CM | POA: Insufficient documentation

## 2017-09-25 DIAGNOSIS — S0181XA Laceration without foreign body of other part of head, initial encounter: Secondary | ICD-10-CM

## 2017-09-25 DIAGNOSIS — Y92009 Unspecified place in unspecified non-institutional (private) residence as the place of occurrence of the external cause: Secondary | ICD-10-CM | POA: Insufficient documentation

## 2017-09-25 DIAGNOSIS — S022XXA Fracture of nasal bones, initial encounter for closed fracture: Secondary | ICD-10-CM

## 2017-09-25 DIAGNOSIS — S0083XD Contusion of other part of head, subsequent encounter: Secondary | ICD-10-CM

## 2017-09-25 DIAGNOSIS — Y939 Activity, unspecified: Secondary | ICD-10-CM | POA: Insufficient documentation

## 2017-09-25 LAB — I-STAT BETA HCG BLOOD, ED (MC, WL, AP ONLY): I-stat hCG, quantitative: <5 m[IU]/mL (ref <5)

## 2017-09-25 MED ORDER — SODIUM CHLORIDE 0.9 % IV BOLUS (SEPSIS)
1000.0000 mL | Freq: Once | INTRAVENOUS | Status: AC
Start: 2017-09-25 — End: 2017-09-25
  Administered 2017-09-25: 1000 mL via INTRAVENOUS

## 2017-09-25 MED ORDER — LIDOCAINE-EPINEPHRINE (PF) 2 %-1:200000 IJ SOLN
10.0000 mL | Freq: Once | INTRAMUSCULAR | Status: AC
  Administered 2017-09-25: 10 mL
  Filled 2017-09-25: qty 20

## 2017-09-25 MED ORDER — HYDROMORPHONE HCL 1 MG/ML IJ SOLN
0.5000 mg | Freq: Once | INTRAMUSCULAR | Status: AC
  Administered 2017-09-25: 0.5 mg via INTRAVENOUS
  Filled 2017-09-25: qty 1

## 2017-09-25 MED ORDER — OXYCODONE-ACETAMINOPHEN 5-325 MG PO TABS
2.0000 | ORAL_TABLET | Freq: Once | ORAL | Status: AC
  Administered 2017-09-25: 2 via ORAL
  Filled 2017-09-25: qty 2

## 2017-09-25 NOTE — ED Notes (Signed)
Pt to CT
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
## 2017-09-25 NOTE — ED Notes (Signed)
Pt verbalizes understanding of d/c instructions. Pt ambulatory at d/c with all belongings.   
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
## 2017-09-25 NOTE — ED Notes (Signed)
ED Provider at bedside. 
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
## 2017-11-10 ENCOUNTER — Emergency Department (HOSPITAL_COMMUNITY)
Admission: EM | Admit: 2017-11-10 | Discharge: 2017-11-10 | Disposition: A | Discharge status: Home / Self Care | Payer: Self-pay | Attending: Emergency Medicine | Admitting: Emergency Medicine

## 2017-11-10 ENCOUNTER — Other Ambulatory Visit: Payer: Self-pay

## 2017-11-10 ENCOUNTER — Encounter (HOSPITAL_COMMUNITY): Payer: Self-pay | Admitting: Emergency Medicine

## 2017-11-10 DIAGNOSIS — R740 Nonspecific elevation of levels of transaminase and lactic acid dehydrogenase [LDH]: Secondary | ICD-10-CM | POA: Insufficient documentation

## 2017-11-10 DIAGNOSIS — R17 Unspecified jaundice: Secondary | ICD-10-CM

## 2017-11-10 DIAGNOSIS — D696 Thrombocytopenia, unspecified: Secondary | ICD-10-CM

## 2017-11-10 DIAGNOSIS — R040 Epistaxis: Secondary | ICD-10-CM

## 2017-11-10 DIAGNOSIS — Z87891 Personal history of nicotine dependence: Secondary | ICD-10-CM | POA: Insufficient documentation

## 2017-11-10 DIAGNOSIS — R7401 Elevation of levels of liver transaminase levels: Secondary | ICD-10-CM

## 2017-11-10 LAB — COMPREHENSIVE METABOLIC PANEL
ALBUMIN: 2.7 g/dL — AB (ref 3.5–5.0)
ALK PHOS: 160 U/L — AB (ref 38–126)
ALT: 34 U/L (ref 14–54)
AST: 144 U/L — ABNORMAL HIGH (ref 15–41)
Anion gap: 12 (ref 5–15)
BILIRUBIN TOTAL: 17 mg/dL — AB (ref 0.3–1.2)
BUN: <5 mg/dL — ABNORMAL LOW (ref 6–20)
CALCIUM: 8.4 mg/dL — AB (ref 8.9–10.3)
CO2: 25 mmol/L (ref 22–32)
CREATININE: 0.58 mg/dL (ref 0.44–1.00)
Chloride: 96 mmol/L — ABNORMAL LOW (ref 101–111)
GFR calc non Af Amer: >60 mL/min (ref >60)
Glucose, Bld: 140 mg/dL — ABNORMAL HIGH (ref 65–99)
Potassium: 3.4 mmol/L — ABNORMAL LOW (ref 3.5–5.1)
SODIUM: 133 mmol/L — AB (ref 135–145)
Total Protein: 7.6 g/dL (ref 6.5–8.1)

## 2017-11-10 LAB — CBC WITH DIFFERENTIAL/PLATELET
BASOS PCT: 3 %
Basophils Absolute: 0.3 10*3/uL — ABNORMAL HIGH (ref 0.0–0.1)
EOS ABS: 0 10*3/uL (ref 0.0–0.7)
Eosinophils Relative: 0 %
HEMATOCRIT: 35.3 % — AB (ref 36.0–46.0)
HEMOGLOBIN: 12.9 g/dL (ref 12.0–15.0)
LYMPHS PCT: 7 %
Lymphs Abs: 0.7 10*3/uL (ref 0.7–4.0)
MCH: 36.1 pg — AB (ref 26.0–34.0)
MCHC: 36.5 g/dL — ABNORMAL HIGH (ref 30.0–36.0)
MCV: 98.9 fL (ref 78.0–100.0)
MONOS PCT: 6 %
Monocytes Absolute: 0.6 10*3/uL (ref 0.1–1.0)
NEUTROS PCT: 84 %
Neutro Abs: 7.9 10*3/uL — ABNORMAL HIGH (ref 1.7–7.7)
Platelets: 93 10*3/uL — ABNORMAL LOW (ref 150–400)
RBC: 3.57 MIL/uL — ABNORMAL LOW (ref 3.87–5.11)
RDW: 16.3 % — ABNORMAL HIGH (ref 11.5–15.5)
WBC: 9.5 10*3/uL (ref 4.0–10.5)

## 2017-11-10 LAB — PROTIME-INR
INR: 1.89
Prothrombin Time: 21.5 seconds — ABNORMAL HIGH (ref 11.4–15.2)

## 2017-11-10 LAB — APTT: aPTT: 44 seconds — ABNORMAL HIGH (ref 24–36)

## 2017-11-10 MED ORDER — OXYMETAZOLINE HCL 0.05 % NA SOLN
1.0000 | Freq: Once | NASAL | Status: AC
  Administered 2017-11-10: 1 via NASAL
  Filled 2017-11-10: qty 15

## 2017-11-10 MED ORDER — ONDANSETRON 4 MG PO TBDP
4.0000 mg | ORAL_TABLET | Freq: Once | ORAL | Status: AC
  Administered 2017-11-10: 4 mg via ORAL
  Filled 2017-11-10: qty 1
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
## 2017-11-10 NOTE — Discharge Instructions (Signed)
As we discussed, your labs today were grossly abnormal including your liver enzymes, alkaline phosphatase, and bilirubin.  This is likely due to your alcohol use and his in turn contributing to your nosebleeds and yellowing of the skin. I strongly recommend that you stop drinking.  He can reach out to some of the local clinics to help with this.  I have attached some information about this as well. I would also recommend that you follow-up with a GI physician for ongoing evaluation of your liver function. Return to the ED for new or worsening symptoms.
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
## 2018-03-17 ENCOUNTER — Encounter (HOSPITAL_COMMUNITY): Payer: Self-pay | Admitting: *Deleted

## 2018-03-17 ENCOUNTER — Emergency Department (HOSPITAL_COMMUNITY): Payer: Medicaid Other

## 2018-03-17 ENCOUNTER — Inpatient Hospital Stay (HOSPITAL_COMMUNITY): Payer: Medicaid Other

## 2018-03-17 ENCOUNTER — Inpatient Hospital Stay (HOSPITAL_COMMUNITY)
Admission: EM | Admit: 2018-03-17 | Discharge: 2018-04-25 | DRG: 432 | Disposition: E | Payer: Medicaid Other | Attending: Pulmonary Disease | Admitting: Pulmonary Disease

## 2018-03-17 DIAGNOSIS — K7040 Alcoholic hepatic failure without coma: Principal | ICD-10-CM | POA: Diagnosis present

## 2018-03-17 DIAGNOSIS — Z888 Allergy status to other drugs, medicaments and biological substances status: Secondary | ICD-10-CM

## 2018-03-17 DIAGNOSIS — D696 Thrombocytopenia, unspecified: Secondary | ICD-10-CM | POA: Diagnosis present

## 2018-03-17 DIAGNOSIS — E7220 Disorder of urea cycle metabolism, unspecified: Secondary | ICD-10-CM | POA: Diagnosis not present

## 2018-03-17 DIAGNOSIS — E8809 Other disorders of plasma-protein metabolism, not elsewhere classified: Secondary | ICD-10-CM | POA: Diagnosis present

## 2018-03-17 DIAGNOSIS — E871 Hypo-osmolality and hyponatremia: Secondary | ICD-10-CM | POA: Diagnosis present

## 2018-03-17 DIAGNOSIS — F10229 Alcohol dependence with intoxication, unspecified: Secondary | ICD-10-CM | POA: Diagnosis present

## 2018-03-17 DIAGNOSIS — J189 Pneumonia, unspecified organism: Secondary | ICD-10-CM | POA: Diagnosis not present

## 2018-03-17 DIAGNOSIS — R17 Unspecified jaundice: Secondary | ICD-10-CM | POA: Diagnosis not present

## 2018-03-17 DIAGNOSIS — R578 Other shock: Secondary | ICD-10-CM | POA: Diagnosis present

## 2018-03-17 DIAGNOSIS — K219 Gastro-esophageal reflux disease without esophagitis: Secondary | ICD-10-CM | POA: Diagnosis present

## 2018-03-17 DIAGNOSIS — R579 Shock, unspecified: Secondary | ICD-10-CM | POA: Diagnosis not present

## 2018-03-17 DIAGNOSIS — K7011 Alcoholic hepatitis with ascites: Secondary | ICD-10-CM | POA: Diagnosis present

## 2018-03-17 DIAGNOSIS — K709 Alcoholic liver disease, unspecified: Secondary | ICD-10-CM | POA: Diagnosis not present

## 2018-03-17 DIAGNOSIS — N179 Acute kidney failure, unspecified: Secondary | ICD-10-CM | POA: Diagnosis not present

## 2018-03-17 DIAGNOSIS — F419 Anxiety disorder, unspecified: Secondary | ICD-10-CM | POA: Diagnosis present

## 2018-03-17 DIAGNOSIS — D684 Acquired coagulation factor deficiency: Secondary | ICD-10-CM | POA: Diagnosis present

## 2018-03-17 DIAGNOSIS — J9601 Acute respiratory failure with hypoxia: Secondary | ICD-10-CM

## 2018-03-17 DIAGNOSIS — Z781 Physical restraint status: Secondary | ICD-10-CM

## 2018-03-17 DIAGNOSIS — J90 Pleural effusion, not elsewhere classified: Secondary | ICD-10-CM | POA: Diagnosis present

## 2018-03-17 DIAGNOSIS — D649 Anemia, unspecified: Secondary | ICD-10-CM

## 2018-03-17 DIAGNOSIS — R195 Other fecal abnormalities: Secondary | ICD-10-CM | POA: Diagnosis present

## 2018-03-17 DIAGNOSIS — R040 Epistaxis: Secondary | ICD-10-CM | POA: Diagnosis present

## 2018-03-17 DIAGNOSIS — J9811 Atelectasis: Secondary | ICD-10-CM

## 2018-03-17 DIAGNOSIS — R739 Hyperglycemia, unspecified: Secondary | ICD-10-CM | POA: Diagnosis not present

## 2018-03-17 DIAGNOSIS — K766 Portal hypertension: Secondary | ICD-10-CM | POA: Diagnosis present

## 2018-03-17 DIAGNOSIS — G312 Degeneration of nervous system due to alcohol: Secondary | ICD-10-CM | POA: Diagnosis present

## 2018-03-17 DIAGNOSIS — K7290 Hepatic failure, unspecified without coma: Secondary | ICD-10-CM | POA: Diagnosis present

## 2018-03-17 DIAGNOSIS — E876 Hypokalemia: Secondary | ICD-10-CM | POA: Diagnosis present

## 2018-03-17 DIAGNOSIS — E739 Lactose intolerance, unspecified: Secondary | ICD-10-CM | POA: Diagnosis present

## 2018-03-17 DIAGNOSIS — Z789 Other specified health status: Secondary | ICD-10-CM

## 2018-03-17 DIAGNOSIS — F10239 Alcohol dependence with withdrawal, unspecified: Secondary | ICD-10-CM | POA: Diagnosis present

## 2018-03-17 DIAGNOSIS — F1990 Other psychoactive substance use, unspecified, uncomplicated: Secondary | ICD-10-CM | POA: Diagnosis not present

## 2018-03-17 DIAGNOSIS — Y95 Nosocomial condition: Secondary | ICD-10-CM | POA: Diagnosis not present

## 2018-03-17 DIAGNOSIS — D7589 Other specified diseases of blood and blood-forming organs: Secondary | ICD-10-CM | POA: Diagnosis present

## 2018-03-17 DIAGNOSIS — F1020 Alcohol dependence, uncomplicated: Secondary | ICD-10-CM | POA: Diagnosis not present

## 2018-03-17 DIAGNOSIS — E861 Hypovolemia: Secondary | ICD-10-CM | POA: Diagnosis present

## 2018-03-17 DIAGNOSIS — Z01818 Encounter for other preprocedural examination: Secondary | ICD-10-CM

## 2018-03-17 DIAGNOSIS — Z880 Allergy status to penicillin: Secondary | ICD-10-CM

## 2018-03-17 DIAGNOSIS — E872 Acidosis: Secondary | ICD-10-CM | POA: Diagnosis present

## 2018-03-17 DIAGNOSIS — S300XXA Contusion of lower back and pelvis, initial encounter: Secondary | ICD-10-CM | POA: Diagnosis present

## 2018-03-17 DIAGNOSIS — Y900 Blood alcohol level of less than 20 mg/100 ml: Secondary | ICD-10-CM | POA: Diagnosis present

## 2018-03-17 DIAGNOSIS — Z87891 Personal history of nicotine dependence: Secondary | ICD-10-CM

## 2018-03-17 DIAGNOSIS — Z4659 Encounter for fitting and adjustment of other gastrointestinal appliance and device: Secondary | ICD-10-CM

## 2018-03-17 DIAGNOSIS — E8770 Fluid overload, unspecified: Secondary | ICD-10-CM | POA: Diagnosis not present

## 2018-03-17 DIAGNOSIS — Z79899 Other long term (current) drug therapy: Secondary | ICD-10-CM

## 2018-03-17 DIAGNOSIS — S40021A Contusion of right upper arm, initial encounter: Secondary | ICD-10-CM | POA: Diagnosis present

## 2018-03-17 DIAGNOSIS — R0902 Hypoxemia: Secondary | ICD-10-CM | POA: Diagnosis not present

## 2018-03-17 DIAGNOSIS — X58XXXA Exposure to other specified factors, initial encounter: Secondary | ICD-10-CM | POA: Diagnosis present

## 2018-03-17 DIAGNOSIS — Z452 Encounter for adjustment and management of vascular access device: Secondary | ICD-10-CM

## 2018-03-17 DIAGNOSIS — K767 Hepatorenal syndrome: Secondary | ICD-10-CM | POA: Diagnosis present

## 2018-03-17 DIAGNOSIS — D62 Acute posthemorrhagic anemia: Secondary | ICD-10-CM | POA: Diagnosis present

## 2018-03-17 DIAGNOSIS — D689 Coagulation defect, unspecified: Secondary | ICD-10-CM | POA: Diagnosis not present

## 2018-03-17 DIAGNOSIS — K7201 Acute and subacute hepatic failure with coma: Secondary | ICD-10-CM | POA: Diagnosis not present

## 2018-03-17 DIAGNOSIS — R000 Tachycardia, unspecified: Secondary | ICD-10-CM | POA: Diagnosis present

## 2018-03-17 DIAGNOSIS — K922 Gastrointestinal hemorrhage, unspecified: Secondary | ICD-10-CM | POA: Diagnosis present

## 2018-03-17 DIAGNOSIS — I361 Nonrheumatic tricuspid (valve) insufficiency: Secondary | ICD-10-CM | POA: Diagnosis not present

## 2018-03-17 DIAGNOSIS — Z79891 Long term (current) use of opiate analgesic: Secondary | ICD-10-CM

## 2018-03-17 DIAGNOSIS — G9340 Encephalopathy, unspecified: Secondary | ICD-10-CM | POA: Diagnosis not present

## 2018-03-17 DIAGNOSIS — K7031 Alcoholic cirrhosis of liver with ascites: Secondary | ICD-10-CM | POA: Diagnosis present

## 2018-03-17 DIAGNOSIS — D65 Disseminated intravascular coagulation [defibrination syndrome]: Secondary | ICD-10-CM | POA: Diagnosis present

## 2018-03-17 DIAGNOSIS — S2001XA Contusion of right breast, initial encounter: Secondary | ICD-10-CM | POA: Diagnosis present

## 2018-03-17 DIAGNOSIS — G9349 Other encephalopathy: Secondary | ICD-10-CM | POA: Diagnosis not present

## 2018-03-17 DIAGNOSIS — Z0189 Encounter for other specified special examinations: Secondary | ICD-10-CM

## 2018-03-17 DIAGNOSIS — K7291 Hepatic failure, unspecified with coma: Secondary | ICD-10-CM | POA: Diagnosis not present

## 2018-03-17 DIAGNOSIS — K7682 Hepatic encephalopathy: Secondary | ICD-10-CM

## 2018-03-17 DIAGNOSIS — K3189 Other diseases of stomach and duodenum: Secondary | ICD-10-CM | POA: Diagnosis present

## 2018-03-17 DIAGNOSIS — I959 Hypotension, unspecified: Secondary | ICD-10-CM

## 2018-03-17 LAB — CBC WITH DIFFERENTIAL/PLATELET
BASOS PCT: 2 %
Basophils Absolute: 0.2 10*3/uL — ABNORMAL HIGH (ref 0.0–0.1)
EOS ABS: 0 10*3/uL (ref 0.0–0.7)
Eosinophils Relative: 0 %
HCT: 19 % — ABNORMAL LOW (ref 36.0–46.0)
HEMOGLOBIN: 6.6 g/dL — AB (ref 12.0–15.0)
LYMPHS PCT: 9 %
Lymphs Abs: 1.1 10*3/uL (ref 0.7–4.0)
MCH: 40.2 pg — AB (ref 26.0–34.0)
MCHC: 34.7 g/dL (ref 30.0–36.0)
MCV: 115.9 fL — AB (ref 78.0–100.0)
Monocytes Absolute: 0.7 10*3/uL (ref 0.1–1.0)
Monocytes Relative: 6 %
Neutro Abs: 9.8 10*3/uL — ABNORMAL HIGH (ref 1.7–7.7)
Neutrophils Relative %: 83 %
Platelets: 175 10*3/uL (ref 150–400)
RBC: 1.64 MIL/uL — ABNORMAL LOW (ref 3.87–5.11)
RDW: 21.2 % — ABNORMAL HIGH (ref 11.5–15.5)
WBC: 11.8 10*3/uL — ABNORMAL HIGH (ref 4.0–10.5)

## 2018-03-17 LAB — URINALYSIS, ROUTINE W REFLEX MICROSCOPIC
GLUCOSE, UA: 50 mg/dL — AB
KETONES UR: NEGATIVE mg/dL
LEUKOCYTES UA: NEGATIVE
NITRITE: POSITIVE — AB
PH: 5 (ref 5.0–8.0)
PROTEIN: 30 mg/dL — AB
Specific Gravity, Urine: 1.014 (ref 1.005–1.030)

## 2018-03-17 LAB — COMPREHENSIVE METABOLIC PANEL
ALK PHOS: 83 U/L (ref 38–126)
ALT: 23 U/L (ref 14–54)
ANION GAP: 13 (ref 5–15)
AST: 67 U/L — ABNORMAL HIGH (ref 15–41)
Albumin: 1.6 g/dL — ABNORMAL LOW (ref 3.5–5.0)
BILIRUBIN TOTAL: 21.1 mg/dL — AB (ref 0.3–1.2)
BUN: 6 mg/dL (ref 6–20)
CALCIUM: 7.9 mg/dL — AB (ref 8.9–10.3)
CO2: 18 mmol/L — ABNORMAL LOW (ref 22–32)
CREATININE: 0.57 mg/dL (ref 0.44–1.00)
Chloride: 100 mmol/L — ABNORMAL LOW (ref 101–111)
Glucose, Bld: 140 mg/dL — ABNORMAL HIGH (ref 65–99)
Potassium: 2.3 mmol/L — CL (ref 3.5–5.1)
SODIUM: 131 mmol/L — AB (ref 135–145)
TOTAL PROTEIN: 6.7 g/dL (ref 6.5–8.1)

## 2018-03-17 LAB — I-STAT BETA HCG BLOOD, ED (MC, WL, AP ONLY)

## 2018-03-17 LAB — PREPARE RBC (CROSSMATCH)

## 2018-03-17 LAB — AMMONIA: AMMONIA: 110 umol/L — AB (ref 9–35)

## 2018-03-17 LAB — RAPID URINE DRUG SCREEN, HOSP PERFORMED
AMPHETAMINES: NOT DETECTED
BARBITURATES: NOT DETECTED
Benzodiazepines: POSITIVE — AB
Cocaine: NOT DETECTED
OPIATES: NOT DETECTED
TETRAHYDROCANNABINOL: NOT DETECTED

## 2018-03-17 LAB — ETHANOL: Alcohol, Ethyl (B): 18 mg/dL — ABNORMAL HIGH (ref <10)

## 2018-03-17 LAB — PROTIME-INR
INR: >10
Prothrombin Time: >90 seconds — ABNORMAL HIGH (ref 11.4–15.2)

## 2018-03-17 LAB — ABO/RH: ABO/RH(D): B POS

## 2018-03-17 LAB — POC OCCULT BLOOD, ED: FECAL OCCULT BLD: POSITIVE — AB

## 2018-03-17 LAB — LIPASE, BLOOD: Lipase: 33 U/L (ref 11–51)

## 2018-03-17 LAB — FOLATE: FOLATE: 1.7 ng/mL — AB (ref >5.9)

## 2018-03-17 LAB — AMYLASE: Amylase: 67 U/L (ref 28–100)

## 2018-03-17 LAB — TSH: TSH: 3.122 u[IU]/mL (ref 0.350–4.500)

## 2018-03-17 LAB — CK: Total CK: 23 U/L — ABNORMAL LOW (ref 38–234)

## 2018-03-17 LAB — MAGNESIUM: MAGNESIUM: 1.3 mg/dL — AB (ref 1.7–2.4)

## 2018-03-17 MED ORDER — MAGNESIUM SULFATE 2 GM/50ML IV SOLN
2.0000 g | Freq: Once | INTRAVENOUS | Status: AC
  Administered 2018-03-18: 2 g via INTRAVENOUS
  Filled 2018-03-17: qty 50

## 2018-03-17 MED ORDER — PROPOFOL 1000 MG/100ML IV EMUL
5.0000 ug/kg/min | Freq: Once | INTRAVENOUS | Status: AC
  Administered 2018-03-17: 5 ug/kg/min via INTRAVENOUS

## 2018-03-17 MED ORDER — FENTANYL CITRATE (PF) 100 MCG/2ML IJ SOLN
50.0000 ug | Freq: Once | INTRAMUSCULAR | Status: AC
  Administered 2018-03-17: 50 ug via INTRAVENOUS

## 2018-03-17 MED ORDER — LORAZEPAM 2 MG/ML IJ SOLN
0.5000 mg | Freq: Once | INTRAMUSCULAR | Status: AC
  Administered 2018-03-17: 0.5 mg via INTRAVENOUS
  Filled 2018-03-17: qty 1

## 2018-03-17 MED ORDER — LACTULOSE 10 GM/15ML PO SOLN: 20.0000 g | Freq: Three times a day (TID) | ORAL | Status: DC

## 2018-03-17 MED ORDER — SODIUM CHLORIDE 0.9 % IV SOLN
1.0000 g | Freq: Every day | INTRAVENOUS | Status: DC
  Administered 2018-03-18: 1 g via INTRAVENOUS
  Filled 2018-03-17: qty 1

## 2018-03-17 MED ORDER — SODIUM CHLORIDE 0.9 % IV SOLN
Freq: Once | INTRAVENOUS | Status: AC
  Administered 2018-03-24: 21:00:00 via INTRAVENOUS

## 2018-03-17 MED ORDER — THIAMINE HCL 100 MG/ML IJ SOLN
100.0000 mg | Freq: Once | INTRAMUSCULAR | Status: AC
  Administered 2018-03-17: 100 mg via INTRAVENOUS
  Filled 2018-03-17: qty 2

## 2018-03-17 MED ORDER — ETOMIDATE 2 MG/ML IV SOLN
INTRAVENOUS | Status: AC | PRN
  Administered 2018-03-17: 20 mg via INTRAVENOUS

## 2018-03-17 MED ORDER — SODIUM CHLORIDE 0.9 % IV SOLN
Freq: Once | INTRAVENOUS | Status: AC
  Administered 2018-03-17: 21:00:00 via INTRAVENOUS

## 2018-03-17 MED ORDER — FENTANYL CITRATE (PF) 100 MCG/2ML IJ SOLN
INTRAMUSCULAR | Status: AC
  Filled 2018-03-17: qty 2

## 2018-03-17 MED ORDER — PANTOPRAZOLE SODIUM 40 MG IV SOLR
40.0000 mg | Freq: Two times a day (BID) | INTRAVENOUS | Status: DC
  Administered 2018-03-17 – 2018-03-20 (×6): 40 mg via INTRAVENOUS
  Filled 2018-03-17 (×6): qty 40

## 2018-03-17 MED ORDER — FENTANYL CITRATE (PF) 100 MCG/2ML IJ SOLN
INTRAMUSCULAR | Status: AC | PRN
  Administered 2018-03-17: 50 ug via INTRAVENOUS

## 2018-03-17 MED ORDER — VITAMIN K1 10 MG/ML IJ SOLN
10.0000 mg | Freq: Once | INTRAVENOUS | Status: AC
  Administered 2018-03-17: 10 mg via INTRAVENOUS
  Filled 2018-03-17: qty 1

## 2018-03-17 MED ORDER — RIFAXIMIN 200 MG PO TABS
200.0000 mg | ORAL_TABLET | Freq: Three times a day (TID) | ORAL | Status: DC
  Filled 2018-03-17 (×3): qty 1

## 2018-03-17 MED ORDER — SUCCINYLCHOLINE CHLORIDE 20 MG/ML IJ SOLN
INTRAMUSCULAR | Status: AC | PRN
  Administered 2018-03-17: 100 mg via INTRAVENOUS

## 2018-03-17 MED ORDER — LACTULOSE ENEMA
300.0000 mL | Freq: Three times a day (TID) | ORAL | Status: DC
  Filled 2018-03-17: qty 300

## 2018-03-17 MED ORDER — SODIUM CHLORIDE 0.9 % IV BOLUS
500.0000 mL | Freq: Once | INTRAVENOUS | Status: AC
Start: 2018-03-17 — End: 2018-03-17
  Administered 2018-03-17: 500 mL via INTRAVENOUS

## 2018-03-17 MED ORDER — POTASSIUM CHLORIDE 10 MEQ/100ML IV SOLN
10.0000 meq | INTRAVENOUS | Status: AC
Start: 2018-03-17 — End: 2018-03-18
  Administered 2018-03-17 (×3): 10 meq via INTRAVENOUS
  Filled 2018-03-17 (×3): qty 100

## 2018-03-17 MED ORDER — SODIUM CHLORIDE 0.9 % IV BOLUS
500.0000 mL | Freq: Once | INTRAVENOUS | Status: AC
  Administered 2018-03-17: 500 mL via INTRAVENOUS

## 2018-03-17 MED ORDER — PROPOFOL 1000 MG/100ML IV EMUL
INTRAVENOUS | Status: AC
  Filled 2018-03-17: qty 100

## 2018-03-17 MED ORDER — PROPOFOL 10 MG/ML IV BOLUS
INTRAVENOUS | Status: AC
  Filled 2018-03-17: qty 20

## 2018-03-17 MED ORDER — FENTANYL CITRATE (PF) 2500 MCG/50ML IJ SOLN
0.0000 ug/h | INTRAMUSCULAR | Status: DC
  Administered 2018-03-17: 25 ug/h via INTRAVENOUS
  Administered 2018-03-19: 125 ug/h via INTRAVENOUS
  Filled 2018-03-17 (×2): qty 50

## 2018-03-17 MED ORDER — PROPOFOL 10 MG/ML IV BOLUS
INTRAVENOUS | Status: AC | PRN
  Administered 2018-03-17: 50 mg via INTRAVENOUS

## 2018-03-17 NOTE — ED Notes (Signed)
Informed RN that pt seemed agitated
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
## 2018-03-17 NOTE — Progress Notes (Signed)
Pharmacy: antibiotic dose adjustment Rocephin for GI bleed  1 gm q12 ordered Dose adjusted to 1 gm q24 for GI bleed indication. Herby AbrahamMichelle T. Marvelous Bouwens, Pharm.D. 03/20/2018 10:51 PM

## 2018-03-17 NOTE — ED Notes (Signed)
Date and time results received: 03/16/2018 1919  Test: Potassium Critical Value:2.3  Name of Provider Notified:Dr. Ray  Orders Received? Or Actions Taken?:see orders

## 2018-03-17 NOTE — Sedation Documentation (Signed)
Endotracheal tube placed- 23 @ lip

## 2018-03-17 NOTE — ED Notes (Addendum)
Date and time results received: 03/12/2018 1915  Test:Hgb Critical Value:6.6 Name of Provider Notified: Dr. Rosalia Hammersay  Orders Received? Or Actions Taken?:

## 2018-03-17 NOTE — ED Notes (Signed)
Date and time results received: 02/23/2018 1919  Test:Total Bilirubin Critical Value: 21.2  Name of Provider Notified:Dr. Ray  Orders Received? Or Actions Taken?:

## 2018-03-17 NOTE — ED Triage Notes (Signed)
Per EMS, pt is coming from home after a friend called and stated pt had a nosebleed that could not be stopped. Pt has a hx of alcohol abuse and admits to drinking heavily today. Pt also reports having high ammonia levels in the past. EMS reports that pt is confused and that pt appears to be jaundiced. EMS reports that pt was last normal 2 days ago. EMS reports that pt has a large bruise on the right arm and a large bruise on her back and buttocks.

## 2018-03-17 NOTE — ED Notes (Signed)
Bed: [id number] Expected date:  Expected time:  Means of arrival:  Comments: EMS-[facility]-jaundice
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
## 2018-03-17 NOTE — ED Notes (Signed)
Left nare packed with Vaseline gauze due to bleeding
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
## 2018-03-18 ENCOUNTER — Inpatient Hospital Stay (HOSPITAL_COMMUNITY): Payer: Medicaid Other

## 2018-03-18 ENCOUNTER — Encounter (HOSPITAL_COMMUNITY): Admission: EM | Disposition: E | Payer: Self-pay | Source: Home / Self Care | Attending: Internal Medicine

## 2018-03-18 ENCOUNTER — Encounter (HOSPITAL_COMMUNITY): Payer: Self-pay

## 2018-03-18 DIAGNOSIS — R578 Other shock: Secondary | ICD-10-CM

## 2018-03-18 DIAGNOSIS — K7201 Acute and subacute hepatic failure with coma: Secondary | ICD-10-CM

## 2018-03-18 DIAGNOSIS — K922 Gastrointestinal hemorrhage, unspecified: Secondary | ICD-10-CM

## 2018-03-18 DIAGNOSIS — K7290 Hepatic failure, unspecified without coma: Secondary | ICD-10-CM

## 2018-03-18 HISTORY — PX: ESOPHAGOGASTRODUODENOSCOPY: SHX5428

## 2018-03-18 LAB — COMPREHENSIVE METABOLIC PANEL
ALT: 21 U/L (ref 14–54)
AST: 51 U/L — ABNORMAL HIGH (ref 15–41)
Albumin: 1.8 g/dL — ABNORMAL LOW (ref 3.5–5.0)
Alkaline Phosphatase: 58 U/L (ref 38–126)
Anion gap: 6 (ref 5–15)
BUN: 8 mg/dL (ref 6–20)
CO2: 21 mmol/L — ABNORMAL LOW (ref 22–32)
Calcium: 7.3 mg/dL — ABNORMAL LOW (ref 8.9–10.3)
Chloride: 107 mmol/L (ref 101–111)
Creatinine, Ser: 0.51 mg/dL (ref 0.44–1.00)
GFR calc Af Amer: >60 mL/min (ref >60)
GFR calc non Af Amer: >60 mL/min (ref >60)
Glucose, Bld: 100 mg/dL — ABNORMAL HIGH (ref 65–99)
Potassium: 2.4 mmol/L — CL (ref 3.5–5.1)
Sodium: 134 mmol/L — ABNORMAL LOW (ref 135–145)
Total Bilirubin: 17.8 mg/dL — ABNORMAL HIGH (ref 0.3–1.2)
Total Protein: 5.4 g/dL — ABNORMAL LOW (ref 6.5–8.1)

## 2018-03-18 LAB — BLOOD GAS, ARTERIAL
Acid-base deficit: 4.1 mmol/L — ABNORMAL HIGH (ref 0.0–2.0)
Acid-base deficit: 0.4 mmol/L (ref 0.0–2.0)
Bicarbonate: 19.3 mmol/L — ABNORMAL LOW (ref 20.0–28.0)
Bicarbonate: 22.3 mmol/L (ref 20.0–28.0)
DRAWN BY: 225631
Drawn by: 225631
FIO2: 60
FIO2: 50
MECHVT: 0.35 mL
O2 SAT: 99.8 %
O2 Saturation: 99.5 %
PATIENT TEMPERATURE: 99.9
PCO2 ART: 29.8 mmHg — AB (ref 32.0–48.0)
PEEP: 5 cmH2O
PEEP: 5 cmH2O
PH ART: 7.49 — AB (ref 7.350–7.450)
Patient temperature: 35.8
RATE: 16 resp/min
RATE: 16 resp/min
VT: 0.35 mL
pCO2 arterial: 28.8 mmHg — ABNORMAL LOW (ref 32.0–48.0)
pH, Arterial: 7.435 (ref 7.350–7.450)
pO2, Arterial: 132 mmHg — ABNORMAL HIGH (ref 83.0–108.0)
pO2, Arterial: 149 mmHg — ABNORMAL HIGH (ref 83.0–108.0)

## 2018-03-18 LAB — CBC
HEMATOCRIT: 26.4 % — AB (ref 36.0–46.0)
HEMATOCRIT: 25.9 % — AB (ref 36.0–46.0)
Hemoglobin: 9.1 g/dL — ABNORMAL LOW (ref 12.0–15.0)
Hemoglobin: 9.1 g/dL — ABNORMAL LOW (ref 12.0–15.0)
MCH: 33.1 pg (ref 26.0–34.0)
MCH: 33.3 pg (ref 26.0–34.0)
MCHC: 34.5 g/dL (ref 30.0–36.0)
MCHC: 35.1 g/dL (ref 30.0–36.0)
MCV: 96 fL (ref 78.0–100.0)
MCV: 94.9 fL (ref 78.0–100.0)
PLATELETS: 119 10*3/uL — AB (ref 150–400)
Platelets: 120 10*3/uL — ABNORMAL LOW (ref 150–400)
RBC: 2.75 MIL/uL — ABNORMAL LOW (ref 3.87–5.11)
RBC: 2.73 MIL/uL — AB (ref 3.87–5.11)
RDW: 23.8 % — ABNORMAL HIGH (ref 11.5–15.5)
RDW: 24.5 % — ABNORMAL HIGH (ref 11.5–15.5)
WBC: 9.3 10*3/uL (ref 4.0–10.5)
WBC: 8.9 10*3/uL (ref 4.0–10.5)

## 2018-03-18 LAB — CBC WITH DIFFERENTIAL/PLATELET
Basophils Absolute: 0.2 10*3/uL — ABNORMAL HIGH (ref 0.0–0.1)
Basophils Relative: 2 %
Eosinophils Absolute: 0.1 10*3/uL (ref 0.0–0.7)
Eosinophils Relative: 1 %
HCT: 15.7 % — ABNORMAL LOW (ref 36.0–46.0)
Hemoglobin: 5.5 g/dL — CL (ref 12.0–15.0)
Lymphocytes Relative: 14 %
Lymphs Abs: 1.1 10*3/uL (ref 0.7–4.0)
MCH: 35.7 pg — ABNORMAL HIGH (ref 26.0–34.0)
MCHC: 35 g/dL (ref 30.0–36.0)
MCV: 101.9 fL — ABNORMAL HIGH (ref 78.0–100.0)
Monocytes Absolute: 0.4 10*3/uL (ref 0.1–1.0)
Monocytes Relative: 5 %
Neutro Abs: 6 10*3/uL (ref 1.7–7.7)
Neutrophils Relative %: 78 %
Platelets: 112 10*3/uL — ABNORMAL LOW (ref 150–400)
RBC: 1.54 MIL/uL — ABNORMAL LOW (ref 3.87–5.11)
RDW: 28.2 % — ABNORMAL HIGH (ref 11.5–15.5)
WBC: 7.8 10*3/uL (ref 4.0–10.5)

## 2018-03-18 LAB — HIV ANTIBODY (ROUTINE TESTING W REFLEX): HIV Screen 4th Generation wRfx: NONREACTIVE

## 2018-03-18 LAB — PROTIME-INR
INR: 2.14
Prothrombin Time: 23.8 seconds — ABNORMAL HIGH (ref 11.4–15.2)

## 2018-03-18 LAB — PREPARE RBC (CROSSMATCH)

## 2018-03-18 LAB — MRSA PCR SCREENING: MRSA by PCR: NEGATIVE

## 2018-03-18 LAB — HEMOGLOBIN A1C
Hgb A1c MFr Bld: 4.1 % — ABNORMAL LOW (ref 4.8–5.6)
Mean Plasma Glucose: 70.97 mg/dL

## 2018-03-18 LAB — APTT: aPTT: 54 seconds — ABNORMAL HIGH (ref 24–36)

## 2018-03-18 LAB — PROCALCITONIN: Procalcitonin: 0.44 ng/mL

## 2018-03-18 LAB — T4, FREE: Free T4: 0.94 ng/dL (ref 0.61–1.12)

## 2018-03-18 LAB — MAGNESIUM: Magnesium: 1.7 mg/dL (ref 1.7–2.4)

## 2018-03-18 LAB — PHOSPHORUS: Phosphorus: 2.4 mg/dL — ABNORMAL LOW (ref 2.5–4.6)

## 2018-03-18 LAB — LACTIC ACID, PLASMA: Lactic Acid, Venous: 1.7 mmol/L (ref 0.5–1.9)

## 2018-03-18 SURGERY — EGD (ESOPHAGOGASTRODUODENOSCOPY)
Anesthesia: Moderate Sedation

## 2018-03-18 MED ORDER — LACTULOSE 10 GM/15ML PO SOLN
20.0000 g | Freq: Three times a day (TID) | ORAL | Status: DC
  Administered 2018-03-18 – 2018-03-19 (×3): 20 g
  Filled 2018-03-18 (×3): qty 30

## 2018-03-18 MED ORDER — PROPOFOL 1000 MG/100ML IV EMUL: 5.0000 ug/kg/min | Freq: Once | INTRAVENOUS | Status: AC

## 2018-03-18 MED ORDER — DEXTROSE 5 % IV SOLN
0.0000 ug/min | INTRAVENOUS | Status: DC
  Administered 2018-03-18: 2 ug/min via INTRAVENOUS
  Administered 2018-03-18 – 2018-03-19 (×2): 7 ug/min via INTRAVENOUS
  Administered 2018-03-20: 2 ug/min via INTRAVENOUS
  Filled 2018-03-18 (×3): qty 4

## 2018-03-18 MED ORDER — PROPOFOL 1000 MG/100ML IV EMUL
INTRAVENOUS | Status: AC
  Filled 2018-03-18: qty 100

## 2018-03-18 MED ORDER — "THROMBI-PAD 3""X3"" EX PADS"
1.0000 | MEDICATED_PAD | Freq: Once | CUTANEOUS | Status: DC
  Filled 2018-03-18 (×3): qty 1

## 2018-03-18 MED ORDER — THIAMINE HCL 100 MG/ML IJ SOLN
100.0000 mg | INTRAMUSCULAR | Status: DC
  Administered 2018-03-18 – 2018-03-23 (×6): 100 mg via INTRAVENOUS
  Administered 2018-03-25: 13:00:00 via INTRAVENOUS
  Administered 2018-03-26 – 2018-03-27 (×2): 100 mg via INTRAVENOUS
  Filled 2018-03-18: qty 1
  Filled 2018-03-18: qty 2
  Filled 2018-03-18: qty 1
  Filled 2018-03-18 (×2): qty 2
  Filled 2018-03-18: qty 1
  Filled 2018-03-18 (×3): qty 2

## 2018-03-18 MED ORDER — POTASSIUM CHLORIDE 10 MEQ/50ML IV SOLN
INTRAVENOUS | Status: AC
  Administered 2018-03-18: 10 meq
  Filled 2018-03-18: qty 50

## 2018-03-18 MED ORDER — POTASSIUM PHOSPHATES 15 MMOLE/5ML IV SOLN
30.0000 mmol | Freq: Once | INTRAVENOUS | Status: AC
  Administered 2018-03-18: 30 mmol via INTRAVENOUS
  Filled 2018-03-18: qty 10

## 2018-03-18 MED ORDER — SODIUM CHLORIDE 0.9 % IV SOLN
Freq: Once | INTRAVENOUS | Status: AC
  Administered 2018-03-18: 09:00:00 via INTRAVENOUS

## 2018-03-18 MED ORDER — SODIUM CHLORIDE 0.9 % IV SOLN
1.0000 g | INTRAVENOUS | Status: DC
  Administered 2018-03-19 – 2018-03-24 (×6): 1 g via INTRAVENOUS
  Filled 2018-03-18: qty 10
  Filled 2018-03-18 (×5): qty 1
  Filled 2018-03-18: qty 10
  Filled 2018-03-18: qty 1

## 2018-03-18 MED ORDER — MIDAZOLAM HCL 5 MG/ML IJ SOLN
INTRAMUSCULAR | Status: AC
  Filled 2018-03-18: qty 2

## 2018-03-18 MED ORDER — FOLIC ACID 5 MG/ML IJ SOLN
1.0000 mg | Freq: Every day | INTRAMUSCULAR | Status: DC
  Administered 2018-03-18 – 2018-03-27 (×9): 1 mg via INTRAVENOUS
  Filled 2018-03-18 (×11): qty 0.2

## 2018-03-18 MED ORDER — POTASSIUM CHLORIDE 10 MEQ/50ML IV SOLN
10.0000 meq | INTRAVENOUS | Status: AC
  Administered 2018-03-18 (×4): 10 meq via INTRAVENOUS
  Filled 2018-03-18 (×4): qty 50

## 2018-03-18 MED ORDER — FENTANYL CITRATE (PF) 100 MCG/2ML IJ SOLN
INTRAMUSCULAR | Status: AC
  Filled 2018-03-18: qty 2

## 2018-03-18 MED ORDER — ORAL CARE MOUTH RINSE
15.0000 mL | OROMUCOSAL | Status: DC
  Administered 2018-03-18 – 2018-03-20 (×24): 15 mL via OROMUCOSAL

## 2018-03-18 MED ORDER — RIFAXIMIN 200 MG PO TABS
400.0000 mg | ORAL_TABLET | Freq: Three times a day (TID) | ORAL | Status: DC
  Administered 2018-03-18 – 2018-03-22 (×12): 400 mg via ORAL
  Filled 2018-03-18 (×12): qty 2

## 2018-03-18 MED ORDER — MIDAZOLAM HCL 10 MG/2ML IJ SOLN
INTRAMUSCULAR | Status: DC | PRN
  Administered 2018-03-18 (×2): 1 mg via INTRAVENOUS

## 2018-03-18 MED ORDER — CHLORHEXIDINE GLUCONATE 0.12% ORAL RINSE (MEDLINE KIT)
15.0000 mL | Freq: Two times a day (BID) | OROMUCOSAL | Status: DC
  Administered 2018-03-18 – 2018-03-20 (×5): 15 mL via OROMUCOSAL

## 2018-03-18 NOTE — Progress Notes (Signed)
OG Tube removed during Upper Endoscopy. Paged Critical Care NP [facility] [place]) to replace OG tube, since upper Endoscopy was clear.

## 2018-03-18 NOTE — Procedures (Signed)
Intubation Procedure Note Jennifer Moore [id number] [birth year]  Procedure: Intubation Indications: Airway protection and maintenance  Procedure Details Consent: Unable to obtain consent because of altered level of consciousness. Time Out: Verified patient identification, verified procedure, site/side was marked, verified correct patient position, special equipment/implants available, medications/allergies/relevent history reviewed, required imaging and test results available.  Performed  Maximum sterile technique was used including gloves.  MAC    Evaluation Hemodynamic Status: BP stable throughout; O2 sats: stable throughout Patient's Current Condition: stable Complications: No apparent complications Patient did tolerate procedure well. Chest X-ray ordered to verify placement.  CXR: tube position acceptable.   Carlyon Prows 03/10/2018
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
## 2018-03-18 NOTE — Progress Notes (Signed)
CRITICAL VALUE ALERT  Critical Value:  Hgb 5.5  Date & Time Notied:  03/03/2018, 0741am  Provider Notified: Molli KnockYacoub   Orders Received/Actions taken: 2 units RBC's

## 2018-03-18 NOTE — Procedures (Signed)
Central Venous Catheter Insertion Procedure Note Lorayne Mareklita E Pung [id number] 1981-04-08  Procedure: Insertion of Central Venous Catheter Indications: Drug and/or fluid administration  Procedure Details Consent: Unable to obtain consent because of altered level of consciousness. Time Out: Verified patient identification, verified procedure, site/side was marked, verified correct patient position, special equipment/implants available, medications/allergies/relevent history reviewed, required imaging and test results available.  Performed  Maximum sterile technique was used including antiseptics. Skin prep: Chlorhexidine; local anesthetic administered A antimicrobial bonded/coated triple lumen catheter was placed in the right internal jugular vein using the Seldinger technique.  Evaluation Blood flow good Complications: No apparent complications Patient did tolerate procedure well. Chest X-ray ordered to verify placement.  CXR: normal.  Vilma Will 03/09/2018, 4:59 AM

## 2018-03-18 NOTE — Brief Op Note (Signed)
03/03/2018 - 03/16/2018  3:11 PM  PATIENT:  Lorayne MarekAlita E Schueller  37 y.o. female  PRE-OPERATIVE DIAGNOSIS:  Anemia, Upper GI bleed  POST-OPERATIVE DIAGNOSIS:  no signs of varicies; suspect from nose bleed  PROCEDURE:  Procedure(s): ESOPHAGOGASTRODUODENOSCOPY (EGD) (N/A)  SURGEON:  Surgeon(s) and Role:    * Roberto Romanoski, MD - Primary  Findings/recommendations ----------------------------------- - EGD showed no evidence of active bleeding. No evidence of esophageal or gastric varices. Mild portal gastropathy. - Continue supportive care for now. - monitor H&H. Transfuse to keep hemoglobin around 7-8. - Findings discussed with patient's significant other over the phone as well as with Dr. Molli KnockYacoub  - GI will follow  .Kathi DerParag Pricilla Moehle MD, FACP 03/15/2018, 3:12 PM  Contact #  [phone]

## 2018-03-18 NOTE — Progress Notes (Signed)
Initial Nutrition Assessment  DOCUMENTATION CODES:   Obesity unspecified  INTERVENTION:  - If pt to remain intubated >/=24 hours and TF able to be initiated, recommend Vital AF 1.2 @ 25 mL/hr with 30 mL Prostat TID. This regimen will provide 1120 kcal (110% estimated kcal need), 90 grams of protein, and 487 mL free water. - Free water per CCM given medical hx.   Monitor magnesium, potassium, and phosphorus daily for at least 3 days, MD to replete as needed, as pt is at risk for refeeding syndrome given current hypokalemia and hypophosphatemia with low end of normal and pt with hx of cirrhosis with ongoing alcohol abuse.   NUTRITION DIAGNOSIS:   Inadequate oral intake related to inability to eat as evidenced by NPO status.  GOAL:   Provide needs based on ASPEN/SCCM guidelines  MONITOR:   Vent status, Weight trends, Labs  REASON FOR ASSESSMENT:   Ventilator  ASSESSMENT:   37 year old female with past with history of anxiety, current alcohol abuse with alcoholic cirrhosis presents from home 4/23 via EMS for altered mental status and epistaxis.  Patient had epistaxis which would not stop and boyfriend called EMS.  On arrival EMS found the patient be altered.  Pt intubated with OGT to LIS with blood in tubing but no output in wall canister. Per review of CXR results report from yesterday, "nasogastric tube tip projects in mid stomach." No family/visitors present at this time to provide PTA information.  Per PCCM NP note this AM: worsening mental status led to need for intubation, plan for daily SBT, hyponatremia 2/2 poor PO intakes, elevated ammonia 2/2 alcoholic cirrhosis, likely to have EGD today to r/o variceal bleeding, to have liver ultrasound.  Patient is currently intubated on ventilator support MV: 6.9 L/min Temp (24hrs), Avg:98.4 F (36.9 C), Min:95.7 F (35.4 C), Max:100 F (37.8 C) Propofol: none BP: 91/43 and MAP: 58  Medications reviewed; 1 mg IV folic acid/day,  20 g lactulose per OGT TID, 2 g IV Mg sulfate x1 run yesterday, 40 mg IV Protonix BID, 10 mg IV vitamin K per OGT x1 yesterday, 10 mEq IV KCl x3 runs yesterday and x4 runs today, 30 mmol IV KPhos x1 run today, 100 mg IV thaimine/day. Labs reviewed; Na: 134 mmol/L, K: 2.4 mmol/L, Ca: 7.3 mg/dL, Phos: 2.4 mg/dL, Mg: 1.7 mg/dL, AST slightly elevated, ammonia: 110 umol/L.     NUTRITION - FOCUSED PHYSICAL EXAM:  Completed/assessed with no muscle and no fat wasting, no edema at this time.  Diet Order:  Diet NPO time specified  EDUCATION NEEDS:   No education needs have been identified at this time  Skin:  Skin Assessment: Reviewed RN Assessment  Last BM:  4/23  Height:   Ht Readings from Last 1 Encounters:  02/25/2018 4\' 9"  (1.448 m)    Weight:   Wt Readings from Last 1 Encounters:  05-02-2018 160 lb 4.4 oz (72.7 kg)    Ideal Body Weight:  41.45 kg  BMI:  Body mass index is 34.68 kg/m.  Estimated Nutritional Needs:   Kcal:  [phone] (11-14 kcal/kg)  Protein:  >/= 83 grams (2 grams/kg IBW)  Fluid:  >/= 1.5 L/day      Trenton GammonJessica Bauer Ausborn, MS, RD, LDN, [facility] Inpatient Clinical Dietitian Pager # [phone] After hours/weekend pager # [phone]

## 2018-03-18 NOTE — Progress Notes (Addendum)
PULMONARY / CRITICAL CARE MEDICINE   Name: Lorayne Mareklita E Nations MRN: [id number] DOB: 18-May-1981    ADMISSION DATE:  08/25/2018   CHIEF COMPLAINT:  Altered mental status and epistaxis  HISTORY OF PRESENT ILLNESS:   Patient is a 37 year old female with past with history of anxiety, current alcohol abuse with alcoholic cirrhosis presents from home 4/23 via EMS for altered mental status and epistaxis.  Patient had epistaxis which would not stop and boyfriend called EMS.  On arrival EMS found the patient be altered. Workup in the emergency room shows hemoglobin of 6.6 and ammonia of 110.  Patient was given 500 cc of normal saline and ordered 1 unit of PRBC.   Had progressively worsening mental status requiring intubation.  SUBJECTIVE:  Intubated overnight for airway protection.   Hemoglobin 5.5 this a.m.  Now getting 2 units of blood. Still with profound hypokalemia   VITAL SIGNS: BP (!) 85/37   Pulse (!) 103   Temp 98.6 F (37 C) (Core)   Resp 20   Ht 4\' 9"  (1.448 m)   Wt 72.7 kg (160 lb 4.4 oz)   SpO2 100%   BMI 34.68 kg/m    VENTILATOR SETTINGS: Vent Mode: PRVC FiO2 (%):  [40 %-60 %] 40 % Set Rate:  [16 bmp] 16 bmp Vt Set:  [350 mL] 350 mL PEEP:  [5 cmH20] 5 cmH20 Plateau Pressure:  [16 cmH20-19 cmH20] 16 cmH20  INTAKE / OUTPUT: I/O last 3 completed shifts: In: 2997.4 [I.V.:74.4; Blood:1823; IV Piggyback:1100] Out: 350 [Urine:300; Emesis/NG output:50]  PHYSICAL EXAMINATION: General: Young female, no acute distress, sedated on vent HEENT: MM pink/moist, ETT, icteric sclera, bloody drainage from nose and mouth Neuro: Sedated, RASS negative to CV: s1s2 rrr, no m/r/g PULM: Respirations are even and unlabored on the vent, essentially clear GI: Distended, active bowel sounds Extremities: Warm and dry, scant BLE edema Skin: no rashes or lesions  LABS:  BMET Recent Labs  Lab 13-Jun-2018 1834 03/15/2018 0523  NA 131* 134*  K 2.3* 2.4*  CL 100* 107  CO2 18* 21*  BUN 6 8   CREATININE 0.57 0.51  GLUCOSE 140* 100*    Electrolytes Recent Labs  Lab 13-Jun-2018 1834 02/26/2018 0523  CALCIUM 7.9* 7.3*  MG 1.3* 1.7  PHOS  --  2.4*    CBC Recent Labs  Lab 13-Jun-2018 1834 03/03/2018 0523  WBC 11.8* 7.8  HGB 6.6* 5.5*  HCT 19.0* 15.7*  PLT 175 112*    Coag's Recent Labs  Lab 13-Jun-2018 2019 03/22/2018 0523  APTT  --  54*  INR >10.00* 2.14    Sepsis Markers Recent Labs  Lab 02/24/2018 0522 03/12/2018 0523  LATICACIDVEN  --  1.7  PROCALCITON 0.44  --     ABG Recent Labs  Lab 03/14/2018 0100 03/06/2018 0525  PHART 7.435 7.490*  PCO2ART 28.8* 29.8*  PO2ART 132* 149*    Liver Enzymes Recent Labs  Lab 13-Jun-2018 1834 03/23/2018 0523  AST 67* 51*  ALT 23 21  ALKPHOS 83 58  BILITOT 21.1* 17.8*  ALBUMIN 1.6* 1.8*    Cardiac Enzymes No results for input(s): TROPONINI, PROBNP in the last 168 hours.  Glucose No results for input(s): GLUCAP in the last 168 hours.  Imaging Ct Head Wo Contrast  Result Date: 08/25/2018 CLINICAL DATA:  Changing consciousness. EXAM: CT HEAD WITHOUT CONTRAST TECHNIQUE: Contiguous axial images were obtained from the base of the skull through the vertex without intravenous contrast. COMPARISON:  September 25, 2017 FINDINGS: Brain: Evaluation somewhat  limited due to patient motion. A coarse calcification in the left frontal lobe is stable given difference in positioning. This is likely due to remote trauma and is unchanged since 2018. No subdural, epidural, or subarachnoid hemorrhage. No mass effect or midline shift. Ventricles and sulci are unremarkable. Cerebellum and brainstem are normal. Basal cisterns are patent. No acute cortical ischemia or infarct. Vascular: No hyperdense vessel or unexpected calcification. Skull: Normal. Negative for fracture or focal lesion. Sinuses/Orbits: No acute finding. Other: None. IMPRESSION: 1. The study is limited due to patient motion. Within these limitations, no acute intracranial abnormality is  identified. Electronically Signed   By: Gerome Sam III M.D   On: 2018/03/19 20:07   Dg Chest Portable 1 View  Result Date: 2018/03/19 CLINICAL DATA:  ETT adjustment EXAM: PORTABLE CHEST 1 VIEW COMPARISON:  Mar 19, 2018, 08/10/2017 FINDINGS: Endotracheal tube tip is about 2.3 cm superior to the carina. Esophageal tube tip is below the diaphragm but non included. Right-sided central venous catheter tip overlies the right atrium. Low lung volumes. Stable cardiomediastinal silhouette with vascular congestion. No pleural effusion. IMPRESSION: 1. Endotracheal tube tip about 2.3 cm superior to the carina 2. Right IJ central venous catheter tip overlies the right atrium 3. Low lung volumes with vascular congestion and probable mild edema Electronically Signed   By: Jasmine Pang M.D.   On: 19-Mar-2018 23:59   Dg Chest Portable 1 View  Result Date: 03/19/18 CLINICAL DATA:  Intubation, orogastric tube placement. EXAM: PORTABLE CHEST 1 VIEW COMPARISON:  Chest radiograph March 19, 2018 1817 hours FINDINGS: RIGHT mainstem bronchus intubation. RIGHT internal jugular central venous catheter distal tip projects in proximal RIGHT atrium. Nasogastric tube tip projects in mid stomach. Cardiomediastinal silhouette is normal for this low inspiratory examination. Increasing interstitial and alveolar airspace opacities. RIGHT lung base granuloma. No pleural effusion. No pneumothorax. Osseous structures are unchanged. IMPRESSION: 1. RIGHT mainstem bronchus intubation, recommend 2 cm retraction. RIGHT IJ line tip projects in proximal RIGHT atrium, consider 1-2 cm retraction. Nasogastric tube tip projects in mid stomach. 2. Increasing interstitial and alveolar airspace opacities seen with pulmonary edema and/or pneumonia. 3. Recommendations discussed with Aurther Loft, RN in ED on 03-19-2018 at 11:10 pm. Electronically Signed   By: Awilda Metro M.D.   On: March 19, 2018 23:11   Dg Chest Port 1 View  Result Date: 03/19/18 CLINICAL  DATA:  Liver failure, epistaxis and confusion. EXAM: PORTABLE CHEST 1 VIEW COMPARISON:  08/10/2017 FINDINGS: The heart size and mediastinal contours are within normal limits. Both lungs are clear. The visualized skeletal structures are unremarkable. IMPRESSION: No active disease. Electronically Signed   By: Tollie Eth M.D.   On: Mar 19, 2018 18:38     STUDIES:  03-19-2018 CT head: No acute intracranial abnormality  CULTURES: Blood cultures x2  4/23 >>  ANTIBIOTICS: Ceftriaxone 4/23 >>  SIGNIFICANT EVENTS: Intubation 03-19-2018   LINES/TUBES:  ETT 4/23 >> R IJ CVL 4/23 >>   DISCUSSION: Patient is a 37 year old female with active alcohol abuse and alcoholic cirrhosis admitted 4/23 with epistaxis, altered mental status, profound anemia, upper GI bleed, coagulopathy.  Intubated for airway protection.   ASSESSMENT / PLAN:  PULMONARY A: Ventilatory dependent respiratory failure secondary to altered mental status Epistaxis - improving  P:   Vent support - 8cc/kg  F/u CXR  F/u ABG Daily SBT Monitor oral/nasal bleeding  CARDIOVASCULAR A:  Tachycardia Hypovolemia P:  Continue gentle volume with fluids and blood products Normal EF on previous echo   RENAL A:   Hypokalemia  Hypomagnesemia Hyponatremia secondary to poor oral intake Non-anion gap metabolic acidosis secondary to hyper ammonia-improved P:  K-Phos Follow-up chemistry Continue gentle volume as above Monitor urine output   GASTROINTESTINAL A:   Acute blood loss anemia secondary to upper GI bleed History of alcoholic cirrhosis with active alcohol abuse Elevated ammonia secondary to liver cirrhosis P:   N.p.o. Protonix 40 mg IV every 12 Ceftriaxone 1 g IV every 12 prophylaxis for upper GI bleed with cirrhosis Folate and thiamine IV Continue rifaximin Continue lactulose as below GI following-likely EGD today to rule out ongoing variceal bleeding EtOH abstinence counseling Liver ultrasound  pending  HEMATOLOGIC A:   Acute blood loss anemia secondary to upper GI bleed Macrocytosis Thrombocytopenia Elevated INR and PT secondary to cirrhosis P:  2 units PRBC now  Status post vitamin K, FFP q6 hours CBC Follow-up coags in a.m.   INFECTIOUS A:   No active issues P:   Follow blood cultures Ceftriaxone prophylaxis for upper GI bleed with cirrhosis  ENDOCRINE A:   History of elevated TSH in the past P:   Follow-up TSH-within normal limits  NEUROLOGIC A:   Altered mental status secondary to multifactorial (hepatic encephalopathy, hyponatremia, anemia, alcohol intoxication) P:   RASS goal: -1.   Daily weight Assessment Monitor closely for EtOH withdrawal Continue propofol for now Fentanyl drip Trend ammonia    FAMILY- no family at bedside 4/24.  Patients significant other was updated via phone by GI this a.m. for consent for EGD.  - Inter-disciplinary family meet or Palliative Care meeting due by: 7 days  Dirk Dress, NP 02/28/2018  9:57 AM Pager: [phone] or [phone]  Attending Note:  37 year old with alcoholic cirrhosis who presents to [facility] with acute intoxication, acute liver failure and acute hemorrhagic shock and UGI bleeding.  Patient was intubated for airway protection.  On exam, she is unresponsive with a distended abdomen, jaundiced and decreased BS diffusely.  I reviewed CXR myself, small lung volumes and ETT is in good position.  Discussed with PCCM-NP.  Will transfuse, slow IVF to avoid further ascites.  She is not a transplant candidate since she is actively drinking.  GI to scope today and if needs TIPS will need to consider transfer to [facility].  Will re-evaluate in the afternoon.  The patient is critically ill with multiple organ systems failure and requires high complexity decision making for assessment and support, frequent evaluation and titration of therapies, application of advanced monitoring technologies and extensive  interpretation of multiple databases.   Critical Care Time devoted to patient care services described in this note is  35  Minutes. This time reflects time of care of this signee Dr Koren Bound. This critical care time does not reflect procedure time, or teaching time or supervisory time of PA/NP/Med student/Med Resident etc but could involve care discussion time.  Alyson Reedy, M.D. [facility] Pulmonary/Critical Care Medicine. Pager: [phone]. After hours pager: [phone].

## 2018-03-18 NOTE — Op Note (Signed)
[facility] Patient Name: Jennifer Moore Procedure Date: 03/04/2018 MRN: [id number] Attending MD: Kathi Der , MD Date of Birth: 09/27/1981 CSN: [id number] Age: 37 Admit Type: Inpatient Procedure:                Upper GI endoscopy Indications:              Suspected upper gastrointestinal bleeding Providers:                Kathi Der, MD, Cathlean Marseilles, RN, Madalyn Rob, Technician Referring MD:              Medicines:                Midazolam 2 mg IV Complications:            No immediate complications. Estimated Blood Loss:     Estimated blood loss: none. Procedure:                Pre-Anesthesia Assessment:                           - Prior to the procedure, a History and Physical                            was performed, and patient medications and                            allergies were reviewed. The patient's tolerance of                            previous anesthesia was also reviewed. The risks                            and benefits of the procedure and the sedation                            options and risks were discussed with the patient.                            All questions were answered, and informed consent                            was obtained. Prior Anticoagulants: The patient has                            taken no previous anticoagulant or antiplatelet                            agents. ASA Grade Assessment: II - A patient with                            mild systemic disease. After reviewing the risks  and benefits, the patient was deemed in                            satisfactory condition to undergo the procedure.                           After obtaining informed consent, the endoscope was                            passed under direct vision. Throughout the                            procedure, the patient's blood pressure, pulse, and                            oxygen  saturations were monitored continuously. The                            EG-[id number] ([id number]) scope was introduced through the                            mouth, and advanced to the second part of duodenum.                            The upper GI endoscopy was accomplished without                            difficulty. The patient tolerated the procedure                            well. Scope In: Scope Out: Findings:      There is no endoscopic evidence of bleeding, ulcerations or varices in       the entire esophagus.      Mild portal hypertensive gastropathy was found in the entire examined       stomach.      There is no endoscopic evidence of bleeding, ulceration or varices in       the entire examined stomach.      The cardia and gastric fundus were normal on retroflexion.      The duodenal bulb, first portion of the duodenum and second portion of       the duodenum were normal. Impression:               - Portal hypertensive gastropathy.                           - Normal duodenal bulb, first portion of the                            duodenum and second portion of the duodenum.                           - No specimens collected. Moderate Sedation:      Moderate (conscious) sedation was administered by the endoscopy nurse       and supervised by  the endoscopist. The following parameters were       monitored: oxygen saturation, heart rate, blood pressure, and response       to care. Recommendation:           - Return patient to ICU for ongoing care.                           - Continue present medications. Procedure Code(s):        --- Professional ---                           [id number], Esophagogastroduodenoscopy, flexible,                            transoral; diagnostic, including collection of                            specimen(s) by brushing or washing, when performed                            (separate procedure) Diagnosis Code(s):        --- Professional ---                            K76.6, Portal hypertension                           K31.89, Other diseases of stomach and duodenum CPT copyright 2017 American Medical Association. All rights reserved. The codes documented in this report are preliminary and upon coder review may  be revised to meet current compliance requirements. Kathi DerParag Peretz Thieme, MD Kathi DerParag Brenon Antosh, MD 11-30-17 3:10:55 PM Number of Addenda: 0

## 2018-03-18 NOTE — Progress Notes (Signed)
Pt. Transported from ED-RA to ICU 1233 uneventfully.

## 2018-03-19 ENCOUNTER — Inpatient Hospital Stay (HOSPITAL_COMMUNITY): Payer: Medicaid Other

## 2018-03-19 DIAGNOSIS — R579 Shock, unspecified: Secondary | ICD-10-CM

## 2018-03-19 DIAGNOSIS — J9601 Acute respiratory failure with hypoxia: Secondary | ICD-10-CM

## 2018-03-19 LAB — PREPARE FRESH FROZEN PLASMA
Unit division: 0
Unit division: 0
Unit division: 0
Unit division: 0

## 2018-03-19 LAB — BPAM FFP
Blood Product Expiration Date: 201904282359
Blood Product Expiration Date: 201904282359
Blood Product Expiration Date: 201904282359
Blood Product Expiration Date: 201904282359
ISSUE DATE / TIME: 201904232302
ISSUE DATE / TIME: 201904240019
ISSUE DATE / TIME: 201904240222
ISSUE DATE / TIME: 201904240353
Unit Type and Rh: 7300
Unit Type and Rh: 7300
Unit Type and Rh: 7300
Unit Type and Rh: 7300

## 2018-03-19 LAB — CBC
HCT: 26.1 % — ABNORMAL LOW (ref 36.0–46.0)
HCT: 24.3 % — ABNORMAL LOW (ref 36.0–46.0)
HEMATOCRIT: 25.6 % — AB (ref 36.0–46.0)
HEMOGLOBIN: 9.1 g/dL — AB (ref 12.0–15.0)
Hemoglobin: 8.9 g/dL — ABNORMAL LOW (ref 12.0–15.0)
Hemoglobin: 8.5 g/dL — ABNORMAL LOW (ref 12.0–15.0)
MCH: 32.9 pg (ref 26.0–34.0)
MCH: 33.2 pg (ref 26.0–34.0)
MCH: 33.5 pg (ref 26.0–34.0)
MCHC: 34.9 g/dL (ref 30.0–36.0)
MCHC: 34.8 g/dL (ref 30.0–36.0)
MCHC: 35 g/dL (ref 30.0–36.0)
MCV: 94.2 fL (ref 78.0–100.0)
MCV: 95.5 fL (ref 78.0–100.0)
MCV: 95.7 fL (ref 78.0–100.0)
PLATELETS: 121 10*3/uL — AB (ref 150–400)
PLATELETS: 115 10*3/uL — AB (ref 150–400)
Platelets: 117 10*3/uL — ABNORMAL LOW (ref 150–400)
RBC: 2.77 MIL/uL — AB (ref 3.87–5.11)
RBC: 2.68 MIL/uL — ABNORMAL LOW (ref 3.87–5.11)
RBC: 2.54 MIL/uL — AB (ref 3.87–5.11)
RDW: 24.8 % — ABNORMAL HIGH (ref 11.5–15.5)
RDW: 25 % — AB (ref 11.5–15.5)
RDW: 25.4 % — AB (ref 11.5–15.5)
WBC: 9.2 10*3/uL (ref 4.0–10.5)
WBC: 9.1 10*3/uL (ref 4.0–10.5)
WBC: 8.5 10*3/uL (ref 4.0–10.5)

## 2018-03-19 LAB — BASIC METABOLIC PANEL
Anion gap: 8 (ref 5–15)
BUN: 8 mg/dL (ref 6–20)
CHLORIDE: 105 mmol/L (ref 101–111)
CO2: 20 mmol/L — ABNORMAL LOW (ref 22–32)
Calcium: 8 mg/dL — ABNORMAL LOW (ref 8.9–10.3)
Creatinine, Ser: 0.35 mg/dL — ABNORMAL LOW (ref 0.44–1.00)
Glucose, Bld: 105 mg/dL — ABNORMAL HIGH (ref 65–99)
POTASSIUM: 3 mmol/L — AB (ref 3.5–5.1)
SODIUM: 133 mmol/L — AB (ref 135–145)

## 2018-03-19 LAB — PROTIME-INR
INR: 2.36
Prothrombin Time: 25.6 seconds — ABNORMAL HIGH (ref 11.4–15.2)

## 2018-03-19 LAB — AMMONIA: AMMONIA: 121 umol/L — AB (ref 9–35)

## 2018-03-19 LAB — MAGNESIUM: MAGNESIUM: 1.5 mg/dL — AB (ref 1.7–2.4)

## 2018-03-19 LAB — PHOSPHORUS: Phosphorus: 3.2 mg/dL (ref 2.5–4.6)

## 2018-03-19 MED ORDER — VITAMIN K1 10 MG/ML IJ SOLN
10.0000 mg | Freq: Once | INTRAVENOUS | Status: AC
  Administered 2018-03-19: 10 mg via INTRAVENOUS
  Filled 2018-03-19: qty 1

## 2018-03-19 MED ORDER — RACEPINEPHRINE HCL 2.25 % IN NEBU: 0.5000 mL | INHALATION_SOLUTION | RESPIRATORY_TRACT | Status: DC | PRN

## 2018-03-19 MED ORDER — POTASSIUM CHLORIDE CRYS ER 20 MEQ PO TBCR: 40.0000 meq | EXTENDED_RELEASE_TABLET | Freq: Once | ORAL | Status: DC

## 2018-03-19 MED ORDER — POTASSIUM PHOSPHATES 15 MMOLE/5ML IV SOLN
30.0000 mmol | Freq: Once | INTRAVENOUS | Status: AC
  Administered 2018-03-19: 30 mmol via INTRAVENOUS
  Filled 2018-03-19: qty 10

## 2018-03-19 MED ORDER — SODIUM CHLORIDE 0.9% FLUSH: 10.0000 mL | INTRAVENOUS | Status: DC | PRN

## 2018-03-19 MED ORDER — POTASSIUM CHLORIDE 10 MEQ/50ML IV SOLN
10.0000 meq | INTRAVENOUS | Status: AC
Start: 2018-03-19 — End: 2018-03-19
  Administered 2018-03-19 (×4): 10 meq via INTRAVENOUS
  Filled 2018-03-19 (×4): qty 50

## 2018-03-19 MED ORDER — SODIUM CHLORIDE 0.9% FLUSH
10.0000 mL | Freq: Two times a day (BID) | INTRAVENOUS | Status: DC
  Administered 2018-03-19 – 2018-03-23 (×8): 10 mL

## 2018-03-19 MED ORDER — LACTULOSE 10 GM/15ML PO SOLN
30.0000 g | Freq: Three times a day (TID) | ORAL | Status: DC
  Administered 2018-03-19 – 2018-03-21 (×8): 30 g
  Filled 2018-03-19 (×8): qty 45

## 2018-03-19 MED ORDER — RACEPINEPHRINE HCL 2.25 % IN NEBU
INHALATION_SOLUTION | RESPIRATORY_TRACT | Status: AC
  Administered 2018-03-19: 0.5 mL
  Filled 2018-03-19: qty 0.5

## 2018-03-19 MED ORDER — POTASSIUM CHLORIDE 20 MEQ/15ML (10%) PO SOLN
40.0000 meq | Freq: Once | ORAL | Status: AC
  Administered 2018-03-19: 40 meq via ORAL
  Filled 2018-03-19: qty 30

## 2018-03-19 MED ORDER — MAGNESIUM SULFATE 2 GM/50ML IV SOLN
2.0000 g | Freq: Once | INTRAVENOUS | Status: AC
Start: 2018-03-19 — End: 2018-03-19
  Administered 2018-03-19: 2 g via INTRAVENOUS
  Filled 2018-03-19: qty 50

## 2018-03-19 MED ORDER — CHLORHEXIDINE GLUCONATE CLOTH 2 % EX PADS
6.0000 | MEDICATED_PAD | Freq: Every day | CUTANEOUS | Status: DC
  Administered 2018-03-19 – 2018-03-20 (×2): 6 via TOPICAL

## 2018-03-19 NOTE — Evaluation (Signed)
SLP Cancellation Note  Patient Details Name: Jennifer Moore MRN: [id number] DOB: January 02, 1981   Cancelled treatment:       Reason Eval/Treat Not Completed: Other (comment);Medical issues which prohibited therapy;Fatigue/lethargy limiting ability to participate(pt extubated today, is on venturimask and has alternative means of nutrition, will follow up next date)   Chales AbrahamsKimball, Geonna Lockyer Ann 03/19/2018, 3:17 PM

## 2018-03-19 NOTE — Progress Notes (Signed)
PULMONARY / CRITICAL CARE MEDICINE   Name: Jennifer Moore MRN: [id number] DOB: [birth year]    ADMISSION DATE:  07-16-18   CHIEF COMPLAINT:  Altered mental status and epistaxis  HISTORY OF PRESENT ILLNESS:   Patient is a 37 year old female with past with history of anxiety, current alcohol abuse with alcoholic cirrhosis presents from home 4/23 via EMS for altered mental status and epistaxis.  Patient had epistaxis which would not stop and boyfriend called EMS.  On arrival EMS found the patient be altered. Workup in the emergency room shows hemoglobin of 6.6 and ammonia of 110.  Patient was given 500 cc of normal saline and ordered 1 unit of PRBC.   Had progressively worsening mental status requiring intubation.  SUBJECTIVE:  No events overnight, no new complaints   VITAL SIGNS: BP (!) 94/49 (BP Location: Left Arm)   Pulse (!) 118   Temp 98.8 F (37.1 C)   Resp 15   Ht 4\' 9"  (1.448 m)   Wt 167 lb 15.9 oz (76.2 kg)   SpO2 98%   BMI 36.35 kg/m    VENTILATOR SETTINGS: Vent Mode: PSV FiO2 (%):  [40 %] 40 % Set Rate:  [16 bmp] 16 bmp Vt Set:  [350 mL] 350 mL PEEP:  [5 cmH20] 5 cmH20 Pressure Support:  [8 cmH20] 8 cmH20 Plateau Pressure:  [17 cmH20-20 cmH20] 20 cmH20  INTAKE / OUTPUT: I/O last 3 completed shifts: In: 4450.9 [I.V.:837.9; Blood:2513; IV Piggyback:1100] Out: 1355 [Urine:1130; Emesis/NG output:225]  PHYSICAL EXAMINATION: General: Young female, awake on vent, followign commands HEENT: [place]/AT, PERRL, EOM-I and MMM Neuro: Awake and following commands, moving all ext to command CV: RRR, Nl S1/S2 and -M/R/G. PULM: Decreased BS on the bases GI: Soft, distended with a fluid wave, NT and +BS Extremities: Warm and dry, scant BLE edema Skin: intact  LABS:  BMET Recent Labs  Lab 2018-03-04 1834 03/11/2018 0523 03/19/18 0432  NA 131* 134* 133*  K 2.3* 2.4* 3.0*  CL 100* 107 105  CO2 18* 21* 20*  BUN 6 8 8   CREATININE 0.57 0.51 0.35*  GLUCOSE 140* 100* 105*     Electrolytes Recent Labs  Lab 2018-03-04 1834 03/19/2018 0523 03/19/18 0432  CALCIUM 7.9* 7.3* 8.0*  MG 1.3* 1.7 1.5*  PHOS  --  2.4* 3.2    CBC Recent Labs  Lab 03/11/2018 2222 03/19/18 0432 03/19/18 0944  WBC 8.9 9.2 9.1  HGB 9.1* 9.1* 8.9*  HCT 25.9* 26.1* 25.6*  PLT 120* 121* 117*    Coag's Recent Labs  Lab 2018-03-04 2019 02/27/2018 0523 03/19/18 0432  APTT  --  54*  --   INR >10.00* 2.14 2.36    Sepsis Markers Recent Labs  Lab 03/21/2018 0522 03/23/2018 0523  LATICACIDVEN  --  1.7  PROCALCITON 0.44  --     ABG Recent Labs  Lab 02/28/2018 0100 03/10/2018 0525  PHART 7.435 7.490*  PCO2ART 28.8* 29.8*  PO2ART 132* 149*    Liver Enzymes Recent Labs  Lab 2018-03-04 1834 03/20/2018 0523  AST 67* 51*  ALT 23 21  ALKPHOS 83 58  BILITOT 21.1* 17.8*  ALBUMIN 1.6* 1.8*    Cardiac Enzymes No results for input(s): TROPONINI, PROBNP in the last 168 hours.  Glucose No results for input(s): GLUCAP in the last 168 hours.  Imaging Dg Abd 1 View  Result Date: 03/21/2018 CLINICAL DATA:  NG tube placement EXAM: ABDOMEN - 1 VIEW COMPARISON:  008-22-19 FINDINGS: NG tube within the proximal to mid  stomach.  Stomach is collapsed. Low lung volumes with vascular congestion and bibasilar atelectasis versus airspace disease. Heart is enlarged. No large effusion. No acute osseous finding. IMPRESSION: NG tube within the proximal to mid stomach. Electronically Signed   By: Judie Petit.  Shick M.D.   On: 03/14/2018 18:25   US Abdomen Limited Ruq  Result Date: 03/20/2018 CLINICAL DATA:  Jaundice. EXAM: ULTRASOUND ABDOMEN LIMITED RIGHT UPPER QUADRANT COMPARISON:  None. FINDINGS: Gallbladder: There are no gallstones. There is slight thickening of the gallbladder wall. Slight sludge in the gallbladder. Common bile duct: Diameter: 5.1 mm, normal. Liver: There is increased echogenicity of the liver with nodularity of the liver contour with adjacent ascites. No biliary dilatation. Reverse flow in  the portal vein. IMPRESSION: 1. Cirrhosis with ascites and portal hypertension. 2. Thickened gallbladder wall which is probably due to hypoproteinemia. Electronically Signed   By: Francene Boyers M.D.   On: 02/24/2018 11:56     STUDIES:  03/24/2018 CT head: No acute intracranial abnormality  CULTURES: Blood cultures x2  4/23 >>  ANTIBIOTICS: Ceftriaxone 4/23 >>  SIGNIFICANT EVENTS: Intubation 03/16/2018  LINES/TUBES:  ETT 4/23 >>4/25 R IJ CVL 4/23 >>  DISCUSSION: Patient is a 37 year old female with active alcohol abuse and alcoholic cirrhosis admitted 4/23 with epistaxis, altered mental status, profound anemia, upper GI bleed, coagulopathy.  Intubated for airway protection.  ASSESSMENT / PLAN:  PULMONARY A: Ventilatory dependent respiratory failure secondary to altered mental status Epistaxis - improving  P:   Extubate today F/u CXR as needed F/u ABG as needed Monitor oral/nasal bleeding  CARDIOVASCULAR A:  Tachycardia Hypovolemia P:  KVO IVF Levophed for BP support but only for SBP of 90  RENAL A:   Hypokalemia Hypomagnesemia Hyponatremia secondary to poor oral intake Non-anion gap metabolic acidosis secondary to hyper ammonia-improved P:  K-Phos and mg Follow-up chemistry KVO IVF Monitor urine output  GASTROINTESTINAL A:   Acute blood loss anemia secondary to upper GI bleed History of alcoholic cirrhosis with active alcohol abuse Elevated ammonia secondary to liver cirrhosis P:   SLP post extubation Protonix 40 mg IV every 12 Ceftriaxone 1 g IV every 12 prophylaxis for upper GI bleed with cirrhosis, will continue for now, re-evaluate in AM Folate and thiamine IV Continue rifaximin Continue lactulose increased to 30 gm TID GI following-likely EGD today to rule out ongoing variceal bleeding EtOH abstinence counseling once extubated and ready for discharge Liver ultrasound per GI  HEMATOLOGIC A:   Acute blood loss anemia secondary to upper GI  bleed Macrocytosis Thrombocytopenia Elevated INR and PT secondary to cirrhosis P:  Status post vitamin K, FFP CBC in AM Coags in AM  INFECTIOUS A:   No active issues P:   Follow blood cultures Ceftriaxone prophylaxis for upper GI bleed with cirrhosis  ENDOCRINE A:   History of elevated TSH in the past P:   Follow-up TSH-within normal limits  NEUROLOGIC A:   Altered mental status secondary to multifactorial (hepatic encephalopathy, hyponatremia, anemia, alcohol intoxication) P:   RASS goal: -1.   Daily weight Assessment Monitor closely for EtOH withdrawal D/C propofol D/C Fentanyl drip Trend ammonia Precedex if etoh withdrawal  FAMILY- No family bedside 4/25  - Inter-disciplinary family meet or Palliative Care meeting due by: 7 days  The patient is critically ill with multiple organ systems failure and requires high complexity decision making for assessment and support, frequent evaluation and titration of therapies, application of advanced monitoring technologies and extensive interpretation of multiple databases.   Critical  Care Time devoted to patient care services described in this note is  34  Minutes. This time reflects time of care of this signee Dr Koren Bound. This critical care time does not reflect procedure time, or teaching time or supervisory time of PA/NP/Med student/Med Resident etc but could involve care discussion time.  Alyson Reedy, M.D. [facility] Pulmonary/Critical Care Medicine. Pager: [phone]. After hours pager: [phone].

## 2018-03-19 NOTE — Progress Notes (Signed)
[facility]  Jennifer Moore 36 y.o. 08/21/1981  CC:  Alcoholic cirrhosis, coagulopathy, nosebleed, encephalopathy   Subjective: patient remains intubated. Somewhat tachycardic. No family at bedside.  ROS : not able to obtain   Objective: Vital signs in last 24 hours: Vitals:   03/19/18 0800 03/19/18 0823  BP: (!) 94/49   Pulse: (!) 118   Resp: 15   Temp: 98.8 F (37.1 C)   SpO2: 99% 98%    Physical Exam:  General. Intubated, on mechanical ventilation Eyes. Scleral icterus and mild conjunctival injection Heart. Tachycardia. Abdomen. Distended, not able to appreciate tenderness, bowel sounds present. Lungs. Coarse breath sounds bilaterally Lower extremity. Trace edema   Lab Results: Recent Labs    03/09/2018 0523 03/19/18 0432  NA 134* 133*  K 2.4* 3.0*  CL 107 105  CO2 21* 20*  GLUCOSE 100* 105*  BUN 8 8  CREATININE 0.51 0.35*  CALCIUM 7.3* 8.0*  MG 1.7 1.5*  PHOS 2.4* 3.2   Recent Labs    03/20/2018 1834 02/27/2018 0523  AST 67* 51*  ALT 23 21  ALKPHOS 83 58  BILITOT 21.1* 17.8*  PROT 6.7 5.4*  ALBUMIN 1.6* 1.8*   Recent Labs    02/24/2018 1834 02/26/2018 0523  03/12/2018 2222 03/19/18 0432  WBC 11.8* 7.8   < > 8.9 9.2  NEUTROABS 9.8* 6.0  --   --   --   HGB 6.6* 5.5*   < > 9.1* 9.1*  HCT 19.0* 15.7*   < > 25.9* 26.1*  MCV 115.9* 101.9*   < > 94.9 94.2  PLT 175 112*   < > 120* 121*   < > = values in this interval not displayed.   Recent Labs    03/22/2018 0523 03/19/18 0432  LABPROT 23.8* 25.6*  INR 2.14 2.36      Assessment/Plan: -  acute blood loss anemia. EGD negative yesterday for upper GI bleed. Most likely from nose bleed in setting of significantly elevated INR. - decompensated alcoholic cirrhosis with ascites. MELD score 29 today  - Hepatic encephalopathy - active alcohol use -  Coagulopathy - Hypokalemia. Management per ICU team Recommendations --------------------------- - continue supportive care for  now. Continue lactulose and rifaximin. -  Long-term prognosis poor because of advance MELD score. Not a candidate for transplant because active alcohol use. - IV vitamin K. Repeat LFTs and INR in the morning. - GI will follow  Kathi DerParag Bayani Renteria MD, FACP 03/19/2018, 9:40 AM  Contact #  [phone]

## 2018-03-20 ENCOUNTER — Encounter (HOSPITAL_COMMUNITY): Payer: Self-pay | Admitting: Gastroenterology

## 2018-03-20 DIAGNOSIS — K7291 Hepatic failure, unspecified with coma: Secondary | ICD-10-CM

## 2018-03-20 DIAGNOSIS — G9340 Encephalopathy, unspecified: Secondary | ICD-10-CM

## 2018-03-20 DIAGNOSIS — R0902 Hypoxemia: Secondary | ICD-10-CM

## 2018-03-20 LAB — COMPREHENSIVE METABOLIC PANEL
ALBUMIN: 1.7 g/dL — AB (ref 3.5–5.0)
ALT: 24 U/L (ref 14–54)
ANION GAP: 6 (ref 5–15)
AST: 68 U/L — AB (ref 15–41)
Alkaline Phosphatase: 69 U/L (ref 38–126)
BUN: 7 mg/dL (ref 6–20)
CHLORIDE: 110 mmol/L (ref 101–111)
CO2: 20 mmol/L — ABNORMAL LOW (ref 22–32)
Calcium: 8.3 mg/dL — ABNORMAL LOW (ref 8.9–10.3)
Creatinine, Ser: <0.3 mg/dL — ABNORMAL LOW (ref 0.44–1.00)
GLUCOSE: 92 mg/dL (ref 65–99)
POTASSIUM: 4.1 mmol/L (ref 3.5–5.1)
SODIUM: 136 mmol/L (ref 135–145)
TOTAL PROTEIN: 5.9 g/dL — AB (ref 6.5–8.1)
Total Bilirubin: 21.9 mg/dL (ref 0.3–1.2)

## 2018-03-20 LAB — PHOSPHORUS: PHOSPHORUS: 4.4 mg/dL (ref 2.5–4.6)

## 2018-03-20 LAB — CBC
HCT: 26 % — ABNORMAL LOW (ref 36.0–46.0)
HEMOGLOBIN: 8.9 g/dL — AB (ref 12.0–15.0)
MCH: 33.3 pg (ref 26.0–34.0)
MCHC: 34.2 g/dL (ref 30.0–36.0)
MCV: 97.4 fL (ref 78.0–100.0)
PLATELETS: 111 10*3/uL — AB (ref 150–400)
RBC: 2.67 MIL/uL — AB (ref 3.87–5.11)
RDW: 25.2 % — ABNORMAL HIGH (ref 11.5–15.5)
WBC: 9.1 10*3/uL (ref 4.0–10.5)

## 2018-03-20 LAB — MAGNESIUM: Magnesium: 1.8 mg/dL (ref 1.7–2.4)

## 2018-03-20 LAB — PROTIME-INR
INR: 2.9
Prothrombin Time: 30.1 seconds — ABNORMAL HIGH (ref 11.4–15.2)

## 2018-03-20 MED ORDER — MAGNESIUM SULFATE 2 GM/50ML IV SOLN
2.0000 g | Freq: Once | INTRAVENOUS | Status: AC
  Administered 2018-03-20: 2 g via INTRAVENOUS
  Filled 2018-03-20: qty 50

## 2018-03-20 MED ORDER — ORAL CARE MOUTH RINSE
15.0000 mL | Freq: Two times a day (BID) | OROMUCOSAL | Status: DC
  Administered 2018-03-21 – 2018-03-23 (×5): 15 mL via OROMUCOSAL

## 2018-03-20 MED ORDER — CHLORHEXIDINE GLUCONATE 0.12 % MT SOLN
15.0000 mL | Freq: Two times a day (BID) | OROMUCOSAL | Status: DC
  Administered 2018-03-20 – 2018-03-24 (×7): 15 mL via OROMUCOSAL
  Filled 2018-03-20 (×4): qty 15

## 2018-03-20 MED ORDER — PANTOPRAZOLE SODIUM 40 MG IV SOLR: 40.0000 mg | INTRAVENOUS | Status: DC

## 2018-03-20 MED ORDER — FAMOTIDINE IN NACL 20-0.9 MG/50ML-% IV SOLN
20.0000 mg | Freq: Two times a day (BID) | INTRAVENOUS | Status: DC
  Administered 2018-03-21 – 2018-03-23 (×6): 20 mg via INTRAVENOUS
  Filled 2018-03-20 (×7): qty 50

## 2018-03-20 NOTE — Progress Notes (Signed)
Jennifer Moore 10:50 AM  Subjective: Patient seen and examined and discussed with the nurse and my partner Dr. Leonard SchwartzB and her hospital computer chart reviewed and she was extubated yesterday and is not having any signs of bleeding and currently has no complaints  Objective: Vital signs stable afebrile no acute distress abdomen is soft nontender her NG has drained 500 this shift labs stable bilirubin unchanged from December INR worse CBC stable  Assessment: cirrhosis  Plan: Continue supportive care will ask we can rounding team to check on this weekend and hopefully you can clamp NG and begin clear liquid soon  [facility]aya[facility]OD,Jennifer Moore  Pager [phone] After 5PM or if no answer call [phone]

## 2018-03-20 NOTE — Progress Notes (Signed)
PULMONARY / CRITICAL CARE MEDICINE   Name: Jennifer Moore MRN: [id number] DOB: 1981-08-03    ADMISSION DATE:  03/21/2018   CHIEF COMPLAINT:  Altered mental status and epistaxis  HISTORY OF PRESENT ILLNESS:   Patient is a 37 year old female with past with history of anxiety, current alcohol abuse with alcoholic cirrhosis presents from home 4/23 via EMS for altered mental status and epistaxis.  Patient had epistaxis which would not stop and boyfriend called EMS.  On arrival EMS found the patient be altered. Workup in the emergency room shows hemoglobin of 6.6 and ammonia of 110.  Patient was given 500 cc of normal saline and ordered 1 unit of PRBC.   Had progressively worsening mental status requiring intubation.  SUBJECTIVE:  No events overnight, extubated and on venturi mask  VITAL SIGNS: BP (!) 96/51   Pulse (!) 112   Temp (!) 97 F (36.1 C)   Resp 16   Ht 4\' 9"  (1.448 m)   Wt 165 lb 5.5 oz (75 kg)   SpO2 96%   BMI 35.78 kg/m    VENTILATOR SETTINGS: FiO2 (%):  [31 %] 31 %  INTAKE / OUTPUT: I/O last 3 completed shifts: In: 871.1 [I.V.:671.1; IV Piggyback:200] Out: 1760 [Urine:1185; Emesis/NG output:175; Stool:400]  PHYSICAL EXAMINATION: General: Young female, confused but awake HEENT: [place]/AT, PERRL, EOM-I and MMM Neuro: Awake and moving all ext to command CV: RRR, Nl S1/S2 and -M/R/G PULM: Bibasilar crackles GI: Soft, NT, distended and +BS Extremities: Warm and dry, scant BLE edema Skin: intact  LABS:  BMET Recent Labs  Lab 03/10/2018 0523 03/19/18 0432 03/20/18 0500  NA 134* 133* 136  K 2.4* 3.0* 4.1  CL 107 105 110  CO2 21* 20* 20*  BUN 8 8 7   CREATININE 0.51 0.35* <0.30*  GLUCOSE 100* 105* 92    Electrolytes Recent Labs  Lab 03/12/2018 0523 03/19/18 0432 03/20/18 0500  CALCIUM 7.3* 8.0* 8.3*  MG 1.7 1.5* 1.8  PHOS 2.4* 3.2 4.4    CBC Recent Labs  Lab 03/19/18 0944 03/19/18 1540 03/20/18 0500  WBC 9.1 8.5 9.1  HGB 8.9* 8.5* 8.9*  HCT  25.6* 24.3* 26.0*  PLT 117* 115* 111*    Coag's Recent Labs  Lab 03/13/2018 0523 03/19/18 0432 03/20/18 0500  APTT 54*  --   --   INR 2.14 2.36 2.90    Sepsis Markers Recent Labs  Lab 03/24/2018 0522 03/14/2018 0523  LATICACIDVEN  --  1.7  PROCALCITON 0.44  --     ABG Recent Labs  Lab 03/11/2018 0100 02/27/2018 0525  PHART 7.435 7.490*  PCO2ART 28.8* 29.8*  PO2ART 132* 149*    Liver Enzymes Recent Labs  Lab 03/11/2018 1834 03/06/2018 0523 03/20/18 0500  AST 67* 51* 68*  ALT 23 21 24   ALKPHOS 83 58 69  BILITOT 21.1* 17.8* 21.9*  ALBUMIN 1.6* 1.8* 1.7*    Cardiac Enzymes No results for input(s): TROPONINI, PROBNP in the last 168 hours.  Glucose No results for input(s): GLUCAP in the last 168 hours.  Imaging Dg Abd 1 View  Result Date: 03/19/2018 CLINICAL DATA:  Nasogastric placement. EXAM: ABDOMEN - 1 VIEW COMPARISON:  03/08/2018 FINDINGS: Nasogastric tube enters the stomach with its tip in the region of the antrum. IMPRESSION: Nasogastric tube tip at the gastric antrum. Electronically Signed   By: Paulina FusiMark  Shogry M.D.   On: 03/19/2018 13:49     STUDIES:  03/07/2018 CT head: No acute intracranial abnormality  CULTURES: Blood cultures x2  4/23 >>  ANTIBIOTICS: Ceftriaxone 4/23 >>  SIGNIFICANT EVENTS: Intubation 03/05/2018  LINES/TUBES:  ETT 4/23 >>4/25 R IJ CVL 4/23 >>  I reviewed CXR myself, no acute disease noted  DISCUSSION: Patient is a 37 year old female with active alcohol abuse and alcoholic cirrhosis admitted 4/23 with epistaxis, altered mental status, profound anemia, upper GI bleed, coagulopathy.  Intubated for airway protection.  ASSESSMENT / PLAN:  PULMONARY A: Ventilatory dependent respiratory failure secondary to altered mental status Epistaxis - improving  P:   Titrate O2 for sat of 88-92% Monitor oral/nasal bleeding Swallow evaluation Monitor closely for airway protection  CARDIOVASCULAR A:  Tachycardia Hypovolemia P:  KVO  IVF D/C levophed Accept SBP of 80 as long as mentating and making urine  RENAL A:   Hypokalemia Hypomagnesemia Hyponatremia secondary to poor oral intake Non-anion gap metabolic acidosis secondary to hyper ammonia-improved P:  Replace electrolytes as indicated BMET in AM KVO IVF Monitor urine output  GASTROINTESTINAL A:   Acute blood loss anemia secondary to upper GI bleed History of alcoholic cirrhosis with active alcohol abuse Elevated ammonia secondary to liver cirrhosis P:   SLP Protonix 40 mg IV every 12 Ceftriaxone 1 g IV every 12 prophylaxis for upper GI bleed with cirrhosis, will continue for now, re-evaluate in AM Folate and thiamine IV Continue rifaximin Continue lactulose increased to 30 gm TID GI following-likely EGD today to rule out ongoing variceal bleeding EtOH abstinence counseling once extubated and ready for discharge Liver ultrasound per GI  HEMATOLOGIC A:   Acute blood loss anemia secondary to upper GI bleed Macrocytosis Thrombocytopenia Elevated INR and PT secondary to cirrhosis P:  Status post vitamin K, FFP CBC in AM Coags in AM  INFECTIOUS A:   No active issues P:   Follow blood cultures Ceftriaxone prophylaxis for upper GI bleed with cirrhosis  ENDOCRINE A:   History of elevated TSH in the past P:   Follow-up TSH-within normal limits  NEUROLOGIC A:   Altered mental status secondary to multifactorial (hepatic encephalopathy, hyponatremia, anemia, alcohol intoxication) P:   RASS goal: -1.   Daily weight Assessment Monitor closely for EtOH withdrawal D/C all sedation Ammonia level in AM Precedex if etoh withdrawal  FAMILY - No family present bedside 4/26  - Inter-disciplinary family meet or Palliative Care meeting due by: 7 days  Discussed with [facility]-MD, transfer patient to SDU and to [facility] service with PCCM off 4/27.  Alyson Reedy, M.D. [facility] Pulmonary/Critical Care Medicine. Pager: [phone]. After hours pager:  [phone].

## 2018-03-20 NOTE — Evaluation (Signed)
Physical Therapy Evaluation Patient Details Name: Jennifer Moore MRN: [id number] DOB: October 18, 1981 Today's Date: 03/20/2018   History of Present Illness  Pt admitted through ED with nosebleed, anemia and AMS.  Pt with hx of ETOH abuse  Clinical Impression  Pt admitted as above and presenting with functional mobility limitations 2* generalized weakness, balance deficits and cognitive deficits.  Pt would benefit from follow up rehab at SNF level unless very significant progress is accomplished during acute stay.    Follow Up Recommendations SNF    Equipment Recommendations  None recommended by PT    Recommendations for Other Services OT consult     Precautions / Restrictions Precautions Precautions: Fall Restrictions Weight Bearing Restrictions: No      Mobility  Bed Mobility Overal bed mobility: Needs Assistance Bed Mobility: Supine to Sit     Supine to sit: Mod assist;+2 for physical assistance;+2 for safety/equipment     General bed mobility comments: cues for sequence with physical assist to bring legs over, trunk to upright and to complete turn to sit on EOB  Transfers Overall transfer level: Needs assistance Equipment used: Rolling walker (2 wheeled) Transfers: Sit to/from Stand Sit to Stand: Mod assist;+2 physical assistance;+2 safety/equipment;From elevated surface         General transfer comment: cues for sequence and use of UEs to self assist.  Ambulation/Gait Ambulation/Gait assistance: Mod assist;+2 physical assistance;+2 safety/equipment Ambulation Distance (Feet): 10 Feet Assistive device: Rolling walker (2 wheeled) Gait Pattern/deviations: Step-to pattern;Decreased step length - right;Decreased step length - left;Shuffle;Staggering left;Staggering right;Wide base of support Gait velocity: decr   General Gait Details: cues for posture and position from RW.  Physical assist for support, balance and RW management  Stairs            Wheelchair  Mobility    Modified Rankin (Stroke Patients Only)       Balance Overall balance assessment: Needs assistance Sitting-balance support: Feet supported;No upper extremity supported Sitting balance-Leahy Scale: Fair     Standing balance support: Bilateral upper extremity supported Standing balance-Leahy Scale: Poor                               Pertinent Vitals/Pain Pain Assessment: No/denies pain    Home Living Family/patient expects to be discharged to:: Private residence Living Arrangements: Spouse/significant other Available Help at Discharge: Family Type of Home: House Home Access: Stairs to enter     Home Layout: One level Home Equipment: None      Prior Function Level of Independence: Independent               Hand Dominance        Extremity/Trunk Assessment   Upper Extremity Assessment Upper Extremity Assessment: Generalized weakness    Lower Extremity Assessment Lower Extremity Assessment: Generalized weakness       Communication   Communication: No difficulties  Cognition Arousal/Alertness: Lethargic Behavior During Therapy: Flat affect Overall Cognitive Status: No family/caregiver present to determine baseline cognitive functioning Area of Impairment: Problem solving;Following commands;Safety/judgement                       Following Commands: Follows one step commands inconsistently Safety/Judgement: Decreased awareness of safety   Problem Solving: Slow processing        General Comments      Exercises     Assessment/Plan    PT Assessment Patient needs continued PT services  PT  Problem List Decreased strength;Decreased range of motion;Decreased activity tolerance;Decreased balance;Decreased mobility;Decreased knowledge of use of DME;Decreased cognition;Pain;Obesity       PT Treatment Interventions DME instruction;Gait training;Stair training;Functional mobility training;Therapeutic  activities;Therapeutic exercise;Balance training;Patient/family education    PT Goals (Current goals can be found in the Care Plan section)  Acute Rehab PT Goals Patient Stated Goal: Go to the bathroom (urinary catheter and fecal bag in place) PT Goal Formulation: With patient Time For Goal Achievement: 04/03/18 Potential to Achieve Goals: Fair    Frequency Min 3X/week   Barriers to discharge        Co-evaluation               AM-PAC PT "6 Clicks" Daily Activity  Outcome Measure Difficulty turning over in bed (including adjusting bedclothes, sheets and blankets)?: Unable Difficulty moving from lying on back to sitting on the side of the bed? : Unable Difficulty sitting down on and standing up from a chair with arms (e.g., wheelchair, bedside commode, etc,.)?: Unable Help needed moving to and from a bed to chair (including a wheelchair)?: A Lot Help needed walking in hospital room?: A Lot Help needed climbing 3-5 steps with a railing? : Total 6 Click Score: 8    End of Session Equipment Utilized During Treatment: Gait belt Activity Tolerance: Patient limited by fatigue Patient left: in chair;with call bell/phone within reach Nurse Communication: Mobility status PT Visit Diagnosis: Unsteadiness on feet (R26.81);Muscle weakness (generalized) (M62.81);Difficulty in walking, not elsewhere classified (R26.2)    Time: 1610-9604 PT Time Calculation (min) (ACUTE ONLY): 45 min   Charges:   PT Evaluation $PT Eval Moderate Complexity: 1 Mod PT Treatments $Gait Training: 8-22 mins $Therapeutic Activity: 8-22 mins   PT G Codes:        Pg [phone]   Paticia Moster 03/20/2018, 5:34 PM

## 2018-03-20 NOTE — Progress Notes (Signed)
CRITICAL VALUE ALERT  Critical Value:  Total Bili 21.9   Date & Time Notied: 4/26 0628   Provider Notified: Misty StanleyLisa, RN @ [facility]-link  Orders Received/Actions taken: no new orders at this time
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
## 2018-03-20 NOTE — Progress Notes (Signed)
Patient is having a small amount of fresh bleeding from her left nare and central line.  MD text paged, awaiting response.  Will continue to monitor.

## 2018-03-21 ENCOUNTER — Inpatient Hospital Stay (HOSPITAL_COMMUNITY): Payer: Medicaid Other

## 2018-03-21 DIAGNOSIS — G9349 Other encephalopathy: Secondary | ICD-10-CM

## 2018-03-21 DIAGNOSIS — K7290 Hepatic failure, unspecified without coma: Secondary | ICD-10-CM

## 2018-03-21 DIAGNOSIS — D689 Coagulation defect, unspecified: Secondary | ICD-10-CM

## 2018-03-21 DIAGNOSIS — D649 Anemia, unspecified: Secondary | ICD-10-CM

## 2018-03-21 DIAGNOSIS — E8809 Other disorders of plasma-protein metabolism, not elsewhere classified: Secondary | ICD-10-CM | POA: Diagnosis present

## 2018-03-21 DIAGNOSIS — D696 Thrombocytopenia, unspecified: Secondary | ICD-10-CM | POA: Diagnosis present

## 2018-03-21 DIAGNOSIS — E7220 Disorder of urea cycle metabolism, unspecified: Secondary | ICD-10-CM

## 2018-03-21 DIAGNOSIS — K709 Alcoholic liver disease, unspecified: Secondary | ICD-10-CM

## 2018-03-21 DIAGNOSIS — E876 Hypokalemia: Secondary | ICD-10-CM

## 2018-03-21 DIAGNOSIS — F1020 Alcohol dependence, uncomplicated: Secondary | ICD-10-CM

## 2018-03-21 LAB — URINALYSIS, MICROSCOPIC (REFLEX)

## 2018-03-21 LAB — RETICULOCYTES
RBC.: 2.56 MIL/uL — ABNORMAL LOW (ref 3.87–5.11)
RETIC COUNT ABSOLUTE: 102.4 10*3/uL (ref 19.0–186.0)
Retic Ct Pct: 4 % — ABNORMAL HIGH (ref 0.4–3.1)

## 2018-03-21 LAB — IRON AND TIBC: IRON: 97 ug/dL (ref 28–170)

## 2018-03-21 LAB — AMMONIA: AMMONIA: 110 umol/L — AB (ref 9–35)

## 2018-03-21 LAB — CBC
HEMATOCRIT: 25.4 % — AB (ref 36.0–46.0)
HEMOGLOBIN: 8.5 g/dL — AB (ref 12.0–15.0)
MCH: 33.3 pg (ref 26.0–34.0)
MCHC: 33.5 g/dL (ref 30.0–36.0)
MCV: 99.6 fL (ref 78.0–100.0)
Platelets: 106 10*3/uL — ABNORMAL LOW (ref 150–400)
RBC: 2.55 MIL/uL — ABNORMAL LOW (ref 3.87–5.11)
RDW: 25.6 % — AB (ref 11.5–15.5)
WBC: 7.7 10*3/uL (ref 4.0–10.5)

## 2018-03-21 LAB — TYPE AND SCREEN
ABO/RH(D): B POS
Antibody Screen: NEGATIVE
Unit division: 0
Unit division: 0
Unit division: 0
Unit division: 0

## 2018-03-21 LAB — BPAM RBC
Blood Product Expiration Date: 201905162359
Blood Product Expiration Date: 201905162359
Blood Product Expiration Date: 201905162359
Blood Product Expiration Date: 201905162359
ISSUE DATE / TIME: 201904232058
ISSUE DATE / TIME: 201904240843
ISSUE DATE / TIME: 201904241154
Unit Type and Rh: 7300
Unit Type and Rh: 7300
Unit Type and Rh: 7300
Unit Type and Rh: 7300

## 2018-03-21 LAB — FERRITIN: FERRITIN: 1015 ng/mL — AB (ref 11–307)

## 2018-03-21 LAB — BASIC METABOLIC PANEL
ANION GAP: 7 (ref 5–15)
BUN: 8 mg/dL (ref 6–20)
CALCIUM: 8.7 mg/dL — AB (ref 8.9–10.3)
CO2: 21 mmol/L — AB (ref 22–32)
CREATININE: 0.32 mg/dL — AB (ref 0.44–1.00)
Chloride: 112 mmol/L — ABNORMAL HIGH (ref 101–111)
GFR calc non Af Amer: >60 mL/min (ref >60)
GLUCOSE: 94 mg/dL (ref 65–99)
Potassium: 4.3 mmol/L (ref 3.5–5.1)
Sodium: 140 mmol/L (ref 135–145)

## 2018-03-21 LAB — URINALYSIS, ROUTINE W REFLEX MICROSCOPIC
Glucose, UA: 100 mg/dL — AB
KETONES UR: 15 mg/dL — AB
Nitrite: POSITIVE — AB
PROTEIN: 30 mg/dL — AB
Specific Gravity, Urine: 1.02 (ref 1.005–1.030)
pH: 6.5 (ref 5.0–8.0)

## 2018-03-21 LAB — PHOSPHORUS: PHOSPHORUS: 5 mg/dL — AB (ref 2.5–4.6)

## 2018-03-21 LAB — FOLATE: FOLATE: 5.7 ng/mL — AB (ref >5.9)

## 2018-03-21 LAB — VITAMIN B12: VITAMIN B 12: 6780 pg/mL — AB (ref 180–914)

## 2018-03-21 LAB — MAGNESIUM: Magnesium: 2.4 mg/dL (ref 1.7–2.4)

## 2018-03-21 MED ORDER — SODIUM CHLORIDE 0.9% FLUSH
10.0000 mL | Freq: Two times a day (BID) | INTRAVENOUS | Status: DC
  Administered 2018-03-21 – 2018-03-23 (×5): 10 mL

## 2018-03-21 MED ORDER — SODIUM CHLORIDE 0.9% FLUSH: 10.0000 mL | INTRAVENOUS | Status: DC | PRN

## 2018-03-21 MED ORDER — CHLORHEXIDINE GLUCONATE CLOTH 2 % EX PADS
6.0000 | MEDICATED_PAD | Freq: Every day | CUTANEOUS | Status: DC
  Administered 2018-03-21 – 2018-03-25 (×3): 6 via TOPICAL

## 2018-03-21 NOTE — Progress Notes (Signed)
PROGRESS NOTE    Jennifer Moore  ONG:[id number] DOB: Apr 09, 1981 DOA: 03/03/2018 PCP: Elliot Cousin., MD    Brief Narrative:  Patient is a 37 year old female with past with history of anxiety, current alcohol abuse with alcoholic cirrhosis presents from home 4/23 via EMS for altered mental status and epistaxis.  Patient had epistaxis which would not stop and boyfriend called EMS. She was admitted to Critical care  And intubated for airway protection on  4/23 and extubated on 4/25 when her encephalopathy improved.  She underwent Right IJ placed on 4/23.  She was transferred to [facility] service on 4/27.     Assessment & Plan:   Active Problems:   Substance use disorder   Alcohol use disorder, severe, dependence (HCC)   Anemia   Hyperammonemia (HCC)   Alcoholic liver disease (HCC)   Hypomagnesemia   GI bleed   Thrombocytopenia (HCC)   Hypokalemia   Coagulopathy (HCC)   Hypoalbuminemia   Acute hepatic encephalopathy/ acute metabolic encephalopathy secondary to alcohol abuse:  Improved but not resolved.  Her ammonia levels continued to be high. Last level at 110.  Would recommend to continue with lactulose and monitor her mental status and ammonia level.  Hyponatremia resolved.    Acute anemia of blood loss sec to upper GI bleed possibly from alcohol induced.  GI consulted and pt underwent EGD on 4/24 showed Portal hypertensive gastropathy. Normal duodenal bulb, first portion of the duodenum and second portion of the duodenum. Transfuse to keep hemoglobin greater than 7.  ETOH abstinence counseling.  Resume pepcid, change to oral once she is able to take oral .  No signs of bleeding at this time.    Alcoholic cirrhosis, ascites.  - resume rocephin for prophylaxis for SBP.  - resume rifaximin, folate and thiamine.  - pt denies any abdominal pain .    Hyponatremia, hypokalemia, hypomagnesemia : replaced.    Metabolic acidosis: Improved.    Anemia, thrombocytopenia:    Secondary to liver cirrhosis.  Elevated INR .   Nutrition: SLP evaluated and recommended Dysphagia 1 diet.      DVT prophylaxis: SCD;S Code Status: full code.  Family Communication: none at bedside.  Disposition Plan: pending PT evaluation.    Consultants:   NONE.   Procedures:none.  Antimicrobials: rocephin since admission.   Subjective: Denies any new complaints. Pleasant, but fidgety.   Objective: Vitals:   03/21/18 0445 03/21/18 0451 03/21/18 0847 03/21/18 1000  BP: (!) 96/50  (!) 110/49 (!) 103/35  Pulse: (!) 107  (!) 108 (!) 109  Resp: Temp:      TempSrc:      SpO2: 93%  97% 96%  Weight:  73.1 kg (161 lb 2.5 oz)    Height:        Intake/Output Summary (Last 24 hours) at 03/21/2018 1204 Last data filed at 03/21/2018 1000 Gross per 24 hour  Intake 140 ml  Output 1240 ml  Net -1100 ml   Filed Weights   03/19/18 0400 03/20/18 0600 03/21/18 0451  Weight: 76.2 kg (167 lb 15.9 oz) 75 kg (165 lb 5.5 oz) 73.1 kg (161 lb 2.5 oz)    Examination:  General exam: Appears calm and comfortable with NG TUBE clamped.  Respiratory system: Clear to auscultation. Respiratory effort normal. Cardiovascular system: S1 & S2 heard, RRR.  No pedal edema. Gastrointestinal system: Abdomen is distended, soft, non tender, bowel sounds good. Rectal tube in place.  Central nervous system: Alert but  confused, able to answer simple questions.  Extremities: Symmetric 5 x 5 power. Skin: No rashes, lesions or ulcers Psychiatry: Mood & affect appropriate.     Data Reviewed: I have personally reviewed following labs and imaging studies  CBC: Recent Labs  Lab 23-Mar-2018 1834 03/14/2018 0523  03/19/18 0432 03/19/18 0944 03/19/18 1540 03/20/18 0500 03/21/18 0427  WBC 11.8* 7.8   < > 9.2 9.1 8.5 9.1 7.7  NEUTROABS 9.8* 6.0  --   --   --   --   --   --   HGB 6.6* 5.5*   < > 9.1* 8.9* 8.5* 8.9* 8.5*  HCT 19.0* 15.7*   < > 26.1* 25.6* 24.3* 26.0* 25.4*  MCV 115.9* 101.9*    < > 94.2 95.5 95.7 97.4 99.6  PLT 175 112*   < > 121* 117* 115* 111* 106*   < > = values in this interval not displayed.   Basic Metabolic Panel: Recent Labs  Lab 2018/03/23 1834 03/04/2018 0523 03/19/18 0432 03/20/18 0500 03/21/18 0427  NA 131* 134* 133* 136 140  K 2.3* 2.4* 3.0* 4.1 4.3  CL 100* 107 105 110 112*  CO2 18* 21* 20* 20* 21*  GLUCOSE 140* 100* 105* 92 94  BUN CREATININE 0.57 0.51 0.35* <0.30* 0.32*  CALCIUM 7.9* 7.3* 8.0* 8.3* 8.7*  MG 1.3* 1.7 1.5* 1.8 2.4  PHOS  --  2.4* 3.2 4.4 5.0*   GFR: Estimated Creatinine Clearance: 81.2 mL/min (A) (by C-G formula based on SCr of 0.32 mg/dL (L)). Liver Function Tests: Recent Labs  Lab 23-Mar-2018 1834 02/25/2018 0523 03/20/18 0500  AST 67* 51* 68*  ALT ALKPHOS 83 58 69  BILITOT 21.1* 17.8* 21.9*  PROT 6.7 5.4* 5.9*  ALBUMIN 1.6* 1.8* 1.7*   Recent Labs  Lab 03/23/2018 2042  LIPASE 33  AMYLASE 67   Recent Labs  Lab 23-Mar-2018 1834 03/19/18 0432 03/21/18 0429  AMMONIA 110* 121* 110*   Coagulation Profile: Recent Labs  Lab 03-23-2018 2019 03/20/2018 0523 03/19/18 0432 03/20/18 0500  INR >10.00* 2.14 2.36 2.90   Cardiac Enzymes: Recent Labs  Lab 2018/03/23 2042  CKTOTAL 23*   BNP (last 3 results) No results for input(s): PROBNP in the last 8760 hours. HbA1C: No results for input(s): HGBA1C in the last 72 hours. CBG: No results for input(s): GLUCAP in the last 168 hours. Lipid Profile: No results for input(s): CHOL, HDL, LDLCALC, TRIG, CHOLHDL, LDLDIRECT in the last 72 hours. Thyroid Function Tests: No results for input(s): TSH, T4TOTAL, FREET4, T3FREE, THYROIDAB in the last 72 hours. Anemia Panel: No results for input(s): VITAMINB12, FOLATE, FERRITIN, TIBC, IRON, RETICCTPCT in the last 72 hours. Sepsis Labs: Recent Labs  Lab 03/02/2018 0522 03/19/2018 0523  PROCALCITON 0.44  --   LATICACIDVEN  --  1.7    Recent Results (from the past 240 hour(s))  MRSA PCR Screening     Status:  None   Collection Time: 03/11/2018  4:34 AM  Result Value Ref Range Status   MRSA by PCR NEGATIVE NEGATIVE Final    Comment:        The GeneXpert MRSA Assay (FDA approved for NASAL specimens only), is one component of a comprehensive MRSA colonization surveillance program. It is not intended to diagnose MRSA infection nor to guide or monitor treatment for MRSA infections. Performed at [facility], [address] [place], [place] [address]  Radiology Studies: Dg Abd 1 View  Result Date: 03/19/2018 CLINICAL DATA:  Nasogastric placement. EXAM: ABDOMEN - 1 VIEW COMPARISON:  2018/04/08 FINDINGS: Nasogastric tube enters the stomach with its tip in the region of the antrum. IMPRESSION: Nasogastric tube tip at the gastric antrum. Electronically Signed   By: Paulina Fusi M.D.   On: 03/19/2018 13:49   Dg Chest Port 1 View  Result Date: 03/21/2018 CLINICAL DATA:  Check central line placement. EXAM: PORTABLE CHEST 1 VIEW COMPARISON:  03/24/2018 FINDINGS: The endotracheal tube has been removed. The NG tube is stable. The right IJ central venous catheter tip is in the right atrium and appears unchanged. Suspect layering right pleural effusion and right lower lobe atelectasis. No pneumothorax. Vascular congestion and possible mild interstitial edema. IMPRESSION: 1. Interval removal of the NG tube. The right IJ catheter and NG tubes are stable. 2. Vascular congestion, probable mild pulmonary edema and suspect layering right pleural effusion. Electronically Signed   By: Rudie Meyer M.D.   On: 03/21/2018 11:18        Scheduled Meds: . chlorhexidine  15 mL Mouth Rinse BID  . Chlorhexidine Gluconate Cloth  6 each Topical Daily  . folic acid  1 mg Intravenous Daily  . lactulose  30 g Per Tube TID  . mouth rinse  15 mL Mouth Rinse q12n4p  . rifaximin  400 mg Oral Q8H  . sodium chloride flush  10-40 mL Intracatheter Q12H  . sodium chloride flush  10-40 mL  Intracatheter Q12H  . thiamine injection  100 mg Intravenous Q24H  . THROMBI-PAD  1 each Topical Once   Continuous Infusions: . sodium chloride    . cefTRIAXone (ROCEPHIN)  IV Stopped (03/21/18 0503)  . famotidine (PEPCID) IV 20 mg (03/21/18 1129)  . fentaNYL infusion INTRAVENOUS Stopped (03/19/18 1053)  . norepinephrine (LEVOPHED) Adult infusion Stopped (03/20/18 1003)     LOS: 4 days    Time spent: 35 MINUTES.     Kathlen Mody, MD [facility] Pager [phone]  If 7PM-7AM, please contact night-coverage [email or web address] Password [facility] 03/21/2018, 12:04 PM

## 2018-03-21 NOTE — Progress Notes (Signed)
Central line appears to be pulled out slightly under dressing, x-ray ordered to confirm placement before medications will be given
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
## 2018-03-21 NOTE — Progress Notes (Signed)
  Speech Language Pathology Treatment: Dysphagia  Patient Details Name: Jennifer Moore MRN: [id number] DOB: 05/22/1981 Today's Date: 03/21/2018 Time: 1335-1400 SLP Time Calculation (min) (ACUTE ONLY): 25 min  Assessment / Plan / Recommendation Clinical Impression  Pt eating with NG in place - she reports discomfort with swallowing. RN phoned MD to ask if NG would be removed - and she was able to remove it fortunately.    SLP observed and assisted pt to consume mashed potatoes, jello, Resource breeze and water.  Subtle throat clearing/cough x2 and frequent belching during intake.  Pt admits to sensing reflux = Of note, pt's attention is impaired*RN states ammonia levels remain high* which is impacting her swallowing ability.  Recommend to continue modified diet with strict precautions.  Pt with decreased awareness to solids - requiring cues to masticate.  Given pt is young, anticipate swallow to quickly recover to baseline since NG removed with improved mentation.   Pt does admit she has a h/o hiatal hernia - and recommend GERD precautions.  Informed pt to recommendations and updated swallow precaution sign to allow small single cup boluses *no straws due to increased aspiration risk and belching.  Pt and RN advised.    Thanks for allowing me to help care for this pt- will follow up briefly.   HPI HPI: 37 year old female with past with history of anxiety, current alcohol abuse with alcoholic cirrhosis presents from home via EMS for altered mental status and epistaxis.  All history is obtained from the medical charts and emergency room team.  Patient lives at home possibly with her boyfriend.  Patient had epistaxis which would not stop and boyfriend called EMS.  On arrival EMS found the patient be altered      SLP Plan  Continue with current plan of care       Recommendations  Diet recommendations: Other(comment)(? clears given her NG was just removed and was used for suction previously -  spoke to RN re: concerns) Liquids provided via: Teaspoon;Cup;No straw Medication Administration: Crushed with puree Supervision: Staff to assist with self feeding Compensations: Slow rate;Small sips/bites;Other (Comment) Postural Changes and/or Swallow Maneuvers: Upright 30-60 min after meal;Seated upright 90 degrees                Oral Care Recommendations: Oral care QID Follow up Recommendations: Other (comment)(TBD) SLP Visit Diagnosis: Dysphagia, oropharyngeal phase (R13.12) Plan: Continue with current plan of care       GO                Chales Abrahams 03/21/2018, 3:12 PM Donavan Burnet, MS [facility] SLP [phone]

## 2018-03-22 MED ORDER — SPIRONOLACTONE 25 MG PO TABS
100.0000 mg | ORAL_TABLET | Freq: Every day | ORAL | Status: DC
  Administered 2018-03-22 – 2018-03-23 (×2): 100 mg via ORAL
  Filled 2018-03-22 (×2): qty 4

## 2018-03-22 MED ORDER — RIFAXIMIN 550 MG PO TABS
550.0000 mg | ORAL_TABLET | Freq: Two times a day (BID) | ORAL | Status: DC
  Administered 2018-03-22 – 2018-03-23 (×2): 550 mg via ORAL
  Filled 2018-03-22 (×6): qty 1

## 2018-03-22 MED ORDER — "THROMBI-PAD 3""X3"" EX PADS"
1.0000 | MEDICATED_PAD | Freq: Once | CUTANEOUS | Status: AC
  Administered 2018-03-22: 1 via TOPICAL
  Filled 2018-03-22: qty 1

## 2018-03-22 MED ORDER — FUROSEMIDE 40 MG PO TABS
40.0000 mg | ORAL_TABLET | Freq: Every day | ORAL | Status: DC
  Administered 2018-03-22 – 2018-03-23 (×2): 40 mg via ORAL
  Filled 2018-03-22 (×2): qty 1

## 2018-03-22 MED ORDER — PREDNISOLONE 5 MG PO TABS
40.0000 mg | ORAL_TABLET | Freq: Every day | ORAL | Status: DC
  Administered 2018-03-22 – 2018-03-23 (×2): 40 mg via ORAL
  Filled 2018-03-22 (×4): qty 8

## 2018-03-22 MED ORDER — ADULT MULTIVITAMIN W/MINERALS CH
1.0000 | ORAL_TABLET | Freq: Every day | ORAL | Status: DC
  Administered 2018-03-22 – 2018-03-23 (×2): 1 via ORAL
  Filled 2018-03-22 (×5): qty 1

## 2018-03-22 MED ORDER — LACTULOSE 10 GM/15ML PO SOLN
30.0000 g | Freq: Four times a day (QID) | ORAL | Status: DC
  Administered 2018-03-22 – 2018-03-24 (×9): 30 g
  Filled 2018-03-22 (×11): qty 45

## 2018-03-22 NOTE — Progress Notes (Signed)
Subjective: The patient was seen and examined at bedside. She appears drowsy but follows commands and is able to hold a conversation.  Objective: Vital signs in last 24 hours: Temp:  [97.5 F (36.4 C)-98.1 F (36.7 C)] 98.1 F (36.7 C) (04/28 0354) Pulse Rate:  [100-109] 102 (04/28 0735) Resp:  [10-30] 20 (04/28 0700) BP: (86-110)/(35-62) 92/53 (04/28 0735) SpO2:  [92 %-97 %] 96 % (04/28 0735) Weight:  [74.1 kg (163 lb 5.8 oz)] 74.1 kg (163 lb 5.8 oz) (04/28 0400) Weight change: 1 kg (2 lb 3.3 oz) Last BM Date: 03/22/18  NA:TFTD icterus,mild pallor GENERAL:able to move all 4 extremities ABDOMEN:distended, not tense, dull to percussion EXTREMITIES:no obvious pedal edema  Lab Results: Results for orders placed or performed during the hospital encounter of 03/04/2018 (from the past 48 hour(s))  CBC     Status: Abnormal   Collection Time: 03/21/18  4:27 AM  Result Value Ref Range   WBC 7.7 4.0 - 10.5 K/uL   RBC 2.55 (L) 3.87 - 5.11 MIL/uL   Hemoglobin 8.5 (L) 12.0 - 15.0 g/dL   HCT 25.4 (L) 36.0 - 46.0 %   MCV 99.6 78.0 - 100.0 fL   MCH 33.3 26.0 - 34.0 pg   MCHC 33.5 30.0 - 36.0 g/dL   RDW 25.6 (H) 11.5 - 15.5 %   Platelets 106 (L) 150 - 400 K/uL    Comment: CONSISTENT WITH PREVIOUS RESULT Performed at [facility], [place] [address], [place], [place] [address]   Basic metabolic panel     Status: Abnormal   Collection Time: 03/21/18  4:27 AM  Result Value Ref Range   Sodium 140 135 - 145 mmol/L   Potassium 4.3 3.5 - 5.1 mmol/L   Chloride 112 (H) 101 - 111 mmol/L   CO2 21 (L) 22 - 32 mmol/L   Glucose, Bld 94 65 - 99 mg/dL   BUN 8 6 - 20 mg/dL   Creatinine, Ser 0.32 (L) 0.44 - 1.00 mg/dL    Comment: ICTERUS AT THIS LEVEL MAY AFFECT RESULT   Calcium 8.7 (L) 8.9 - 10.3 mg/dL   GFR calc non Af Amer >60 >60 mL/min   GFR calc Af Amer >60 >60 mL/min    Comment: (NOTE) The eGFR has been calculated using the CKD EPI equation. This calculation has not been  validated in all clinical situations. eGFR's persistently <60 mL/min signify possible Chronic Kidney Disease.    Anion gap 7 5 - 15    Comment: Performed at [facility], [place] [address], [place], [place] [address]  Magnesium     Status: None   Collection Time: 03/21/18  4:27 AM  Result Value Ref Range   Magnesium 2.4 1.7 - 2.4 mg/dL    Comment: Performed at [facility], [place] [address] [place], [place] [address]  Phosphorus     Status: Abnormal   Collection Time: 03/21/18  4:27 AM  Result Value Ref Range   Phosphorus 5.0 (H) 2.5 - 4.6 mg/dL    Comment: ICTERUS AT THIS LEVEL MAY AFFECT RESULT Performed at [place] [address] [place], [place] [address]   Reticulocytes     Status: Abnormal   Collection Time: 03/21/18  4:27 AM  Result Value Ref Range   Retic Ct Pct 4.0 (H) 0.4 - 3.1 %   RBC. 2.56 (L) 3.87 - 5.11 MIL/uL   Retic Count, Absolute 102.4 19.0 - 186.0 K/uL    Comment:  Performed at [facility], [place] [address], [place], [place] [address]  Ammonia     Status: Abnormal   Collection Time: 03/21/18  4:29 AM  Result Value Ref Range   Ammonia 110 (H) 9 - 35 umol/L    Comment: Performed at [facility], [place] [address] [place], [place] [address]  Urinalysis, Routine w reflex microscopic     Status: Abnormal   Collection Time: 03/21/18 12:00 PM  Result Value Ref Range   Color, Urine BROWN (A) YELLOW    Comment: BIOCHEMICALS MAY BE AFFECTED BY COLOR   APPearance TURBID (A) CLEAR   Specific Gravity, Urine 1.020 1.005 - 1.030   pH 6.5 5.0 - 8.0   Glucose, UA 100 (A) NEGATIVE mg/dL   Hgb urine dipstick MODERATE (A) NEGATIVE   Bilirubin Urine LARGE (A) NEGATIVE   Ketones, ur 15 (A) NEGATIVE mg/dL   Protein, ur 30 (A) NEGATIVE mg/dL   Nitrite POSITIVE (A) NEGATIVE   Leukocytes, UA TRACE (A) NEGATIVE    Comment: Performed at [facility], [place] [address], [place], [place] [address]  Urinalysis, Microscopic (reflex)     Status: Abnormal   Collection Time: 03/21/18 12:00 PM  Result Value Ref Range   RBC / HPF 11-20 0 - 5 RBC/hpf   WBC, UA 11-20 0 - 5 WBC/hpf   Bacteria, UA RARE (A) NONE SEEN   Squamous Epithelial / LPF 11-20 0 - 5   Granular Casts, UA PRESENT    Cellular Cast, UA PRESENT     Comment: Performed at [facility], [place] [address] [place], [place] [address]  Vitamin B12     Status: Abnormal   Collection Time: 03/21/18  1:47 PM  Result Value Ref Range   Vitamin B-12 6,780 (H) 180 - 914 pg/mL    Comment: (NOTE) This assay is not validated for testing neonatal or myeloproliferative syndrome specimens for Vitamin B12 levels. Performed at [facility], [place] [address], [place], [place] [address]   Folate     Status: Abnormal   Collection Time: 03/21/18  1:47 PM  Result Value Ref Range   Folate 5.7 (L) >5.9 ng/mL    Comment: Performed at [facility], [place] [address] [place], [place] [address]  Iron and TIBC     Status: None   Collection Time: 03/21/18  1:47 PM  Result Value Ref Range   Iron 97 28 - 170 ug/dL   TIBC NOT CALCULATED 250 - 450 ug/dL   Saturation Ratios NOT CALCULATED 10.4 - 31.8 %   UIBC NOT CALCULATED ug/dL    Comment: Performed at [place] [address] [place], [place] [address]  Ferritin     Status: Abnormal   Collection Time: 03/21/18  1:47 PM  Result Value Ref Range   Ferritin 1,015 (H) 11 - 307 ng/mL    Comment: Performed at [facility], [place] [address], [place], [place] [address]    Studies/Results: Dg Chest Port 1 View  Result Date: 03/21/2018 CLINICAL DATA:  Check central line placement. EXAM: PORTABLE CHEST 1 VIEW COMPARISON:  03/19/2018 FINDINGS: The endotracheal tube has been removed. The NG tube is stable. The right IJ central venous catheter tip is in the right atrium and appears unchanged. Suspect layering right pleural effusion and right  lower lobe atelectasis. No pneumothorax. Vascular congestion and possible mild interstitial edema. IMPRESSION: 1. Interval removal of the NG tube. The right  IJ catheter and NG tubes are stable. 2. Vascular congestion, probable mild pulmonary edema and suspect layering right pleural effusion. Electronically Signed   By: Marijo Sanes M.D.   On: 03/21/2018 11:18    Medications: I have reviewed the patient's current medications.  Assessment: Anemia from epistaxis.EGD negative for source of bleeding.  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  Possible alcoholic hepatitis- discriminant function 105.  Ascites on clinical exam  Hepatic encephalopathy  Plan: Will start low-dose diuretics furosemide 40 mg and spironolactone 100 mg a day. Continue folic acid and thiamine, will start multivitamin 1 tablet daily. Will increase lactulose to 30 g 4 times a day by mouth and continue Xifaxan 550 mg twice a day. Will start patient on  Prednisolone 40 mg daily (to be continued for a total of 28 days) due to high Discriminant function. No signs of infection noted.    Ronnette Juniper 03/22/2018, 8:16 AM   Pager [phone] If no answer or after 5 PM call [phone]

## 2018-03-22 NOTE — Progress Notes (Signed)
Removed central line per order, site continued to bleed. Received order for thrombi pad to help with bleeding. Bleeding resolved, will continue to monitor

## 2018-03-22 NOTE — Progress Notes (Signed)
PROGRESS NOTE    Jennifer Moore  EAV:[id number] DOB: [birth year] DOA: 03/19/2018 PCP: Elliot Cousin., MD    Brief Narrative:  Patient is a 37 year old female with past with history of anxiety, current alcohol abuse with alcoholic cirrhosis presents from home 4/23 via EMS for altered mental status and epistaxis.  Patient had epistaxis which would not stop and boyfriend called EMS. She was admitted to Critical care  And intubated for airway protection on  4/23 and extubated on 4/25 when her encephalopathy improved.  She underwent Right IJ placed on 4/23.  She was transferred to [facility] service on 4/27.     Assessment & Plan:   Active Problems:   Substance use disorder   Alcohol use disorder, severe, dependence (HCC)   Anemia   Hyperammonemia (HCC)   Alcoholic liver disease (HCC)   Hypomagnesemia   GI bleed   Thrombocytopenia (HCC)   Hypokalemia   Coagulopathy (HCC)   Hypoalbuminemia   Acute hepatic encephalopathy/ acute metabolic encephalopathy secondary to alcohol abuse:  Improved but not resolved. She is still confused, able to answer some questions.  Her ammonia levels continued to be high. Last level at 110. Repeat level in am.  Would recommend to continue with lactulose and monitor her mental status and ammonia level.  Hyponatremia resolved.    Acute anemia of blood loss sec to upper GI bleed possibly from alcohol induced.  GI consulted and pt underwent EGD on 4/24 showed Portal hypertensive gastropathy. Normal duodenal bulb, first portion of the duodenum and second portion of the duodenum. Transfuse to keep hemoglobin greater than 7.  ETOH abstinence counseling.  Resume pepcid, change to oral once she is able to take oral .  No signs of bleeding at this time.    Alcoholic cirrhosis, ascites.  - resume rocephin for prophylaxis for SBP.  - resume rifaximin, folate and thiamine.  - pt denies any abdominal pain .    Hyponatremia, hypokalemia, hypomagnesemia:  replaced.    Metabolic acidosis: Improved.    Anemia, thrombocytopenia:  Secondary to liver cirrhosis.  Elevated INR . Some bleeding from the site fo the right IJ when it was removed earlier today. A thrombin pad was used to stop the bleeding.  Anemia panel shows low folate levels, and is being replaced.    Nutrition: SLP evaluated and recommended Dysphagia 1 diet.      DVT prophylaxis: SCD;S Code Status: full code.  Family Communication: none at bedside.  Disposition Plan: pending PT evaluation.    Consultants:   Gastroenterology.   Procedures:egd showing gastropathy.  Antimicrobials: rocephin since admission.   Subjective: Confused, but able to tell her name and wants to go home.   Objective: Vitals:   03/22/18 0735 03/22/18 0800 03/22/18 1055 03/22/18 1100  BP: (!) 92/53  (!) 97/58   Pulse: (!) 102  (!) 108 (!) 107  Resp: Temp:  98.4 F (36.9 C)    TempSrc:  Oral    SpO2: 96%  91% 91%  Weight:      Height:        Intake/Output Summary (Last 24 hours) at 03/22/2018 1223 Last data filed at 03/22/2018 1211 Gross per 24 hour  Intake 900 ml  Output 950 ml  Net -50 ml   Filed Weights   03/20/18 0600 03/21/18 0451 03/22/18 0400  Weight: 75 kg (165 lb 5.5 oz) 73.1 kg (161 lb 2.5 oz) 74.1 kg (163 lb 5.8 oz)    Examination:  General exam: not in distress but fidgety.  Respiratory system: Clear to auscultation. Respiratory effort normal. No wheezing or rhonchi Cardiovascular system: S1 & S2 heard, RRR.  No pedal edema. Gastrointestinal system: Abdomen is distended, soft non tender , with good bowel sounds .  Rectal tube in place.  Central nervous system: Alert but confused, able to answer simple questions.  Extremities: Symmetric 5 x 5 power. Skin: No rashes, lesions or ulcers Psychiatry: Mood & affect appropriate.     Data Reviewed: I have personally reviewed following labs and imaging studies  CBC: Recent Labs  Lab 09-Apr-2018 1834  03/21/2018 0523  03/19/18 0432 03/19/18 0944 03/19/18 1540 03/20/18 0500 03/21/18 0427  WBC 11.8* 7.8   < > 9.2 9.1 8.5 9.1 7.7  NEUTROABS 9.8* 6.0  --   --   --   --   --   --   HGB 6.6* 5.5*   < > 9.1* 8.9* 8.5* 8.9* 8.5*  HCT 19.0* 15.7*   < > 26.1* 25.6* 24.3* 26.0* 25.4*  MCV 115.9* 101.9*   < > 94.2 95.5 95.7 97.4 99.6  PLT 175 112*   < > 121* 117* 115* 111* 106*   < > = values in this interval not displayed.   Basic Metabolic Panel: Recent Labs  Lab 2018-04-09 1834 03/10/2018 0523 03/19/18 0432 03/20/18 0500 03/21/18 0427  NA 131* 134* 133* 136 140  K 2.3* 2.4* 3.0* 4.1 4.3  CL 100* 107 105 110 112*  CO2 18* 21* 20* 20* 21*  GLUCOSE 140* 100* 105* 92 94  BUN CREATININE 0.57 0.51 0.35* <0.30* 0.32*  CALCIUM 7.9* 7.3* 8.0* 8.3* 8.7*  MG 1.3* 1.7 1.5* 1.8 2.4  PHOS  --  2.4* 3.2 4.4 5.0*   GFR: Estimated Creatinine Clearance: 81.8 mL/min (A) (by C-G formula based on SCr of 0.32 mg/dL (L)). Liver Function Tests: Recent Labs  Lab 04-09-18 1834 02/27/2018 0523 03/20/18 0500  AST 67* 51* 68*  ALT ALKPHOS 83 58 69  BILITOT 21.1* 17.8* 21.9*  PROT 6.7 5.4* 5.9*  ALBUMIN 1.6* 1.8* 1.7*   Recent Labs  Lab 2018-04-09 2042  LIPASE 33  AMYLASE 67   Recent Labs  Lab 04/09/2018 1834 03/19/18 0432 03/21/18 0429  AMMONIA 110* 121* 110*   Coagulation Profile: Recent Labs  Lab 04/09/18 2019 02/27/2018 0523 03/19/18 0432 03/20/18 0500  INR >10.00* 2.14 2.36 2.90   Cardiac Enzymes: Recent Labs  Lab Apr 09, 2018 2042  CKTOTAL 23*   BNP (last 3 results) No results for input(s): PROBNP in the last 8760 hours. HbA1C: No results for input(s): HGBA1C in the last 72 hours. CBG: No results for input(s): GLUCAP in the last 168 hours. Lipid Profile: No results for input(s): CHOL, HDL, LDLCALC, TRIG, CHOLHDL, LDLDIRECT in the last 72 hours. Thyroid Function Tests: No results for input(s): TSH, T4TOTAL, FREET4, T3FREE, THYROIDAB in the last 72  hours. Anemia Panel: Recent Labs    03/21/18 0427 03/21/18 1347  VITAMINB12  --  6,780*  FOLATE  --  5.7*  FERRITIN  --  1,015*  TIBC  --  NOT CALCULATED  IRON  --  97  RETICCTPCT 4.0*  --    Sepsis Labs: Recent Labs  Lab 02/26/2018 0522 03/08/2018 0523  PROCALCITON 0.44  --   LATICACIDVEN  --  1.7    Recent Results (from the past 240 hour(s))  MRSA PCR Screening     Status: None  Collection Time: 04-02-18  4:34 AM  Result Value Ref Range Status   MRSA by PCR NEGATIVE NEGATIVE Final    Comment:        The GeneXpert MRSA Assay (FDA approved for NASAL specimens only), is one component of a comprehensive MRSA colonization surveillance program. It is not intended to diagnose MRSA infection nor to guide or monitor treatment for MRSA infections. Performed at [facility], [address] [place], [place] [address]          Radiology Studies: Dg Chest Port 1 View  Result Date: 03/21/2018 CLINICAL DATA:  Check central line placement. EXAM: PORTABLE CHEST 1 VIEW COMPARISON:  02/23/2018 FINDINGS: The endotracheal tube has been removed. The NG tube is stable. The right IJ central venous catheter tip is in the right atrium and appears unchanged. Suspect layering right pleural effusion and right lower lobe atelectasis. No pneumothorax. Vascular congestion and possible mild interstitial edema. IMPRESSION: 1. Interval removal of the NG tube. The right IJ catheter and NG tubes are stable. 2. Vascular congestion, probable mild pulmonary edema and suspect layering right pleural effusion. Electronically Signed   By: Rudie Meyer M.D.   On: 03/21/2018 11:18        Scheduled Meds: . chlorhexidine  15 mL Mouth Rinse BID  . Chlorhexidine Gluconate Cloth  6 each Topical Daily  . folic acid  1 mg Intravenous Daily  . furosemide  40 mg Oral Daily  . lactulose  30 g Per Tube QID  . mouth rinse  15 mL Mouth Rinse q12n4p  . multivitamin with minerals  1 tablet Oral  Daily  . prednisoLONE  40 mg Oral Q breakfast  . rifaximin  550 mg Oral BID  . sodium chloride flush  10-40 mL Intracatheter Q12H  . sodium chloride flush  10-40 mL Intracatheter Q12H  . spironolactone  100 mg Oral Daily  . thiamine injection  100 mg Intravenous Q24H  . THROMBI-PAD  1 each Topical Once   Continuous Infusions: . sodium chloride    . cefTRIAXone (ROCEPHIN)  IV Stopped (03/22/18 0530)  . famotidine (PEPCID) IV Stopped (03/22/18 0948)     LOS: 5 days    Time spent: 35 MINUTES.     Kathlen Mody, MD [facility] Pager [phone]  If 7PM-7AM, please contact night-coverage [email or web address] Password [facility] 03/22/2018, 12:23 PM

## 2018-03-23 DIAGNOSIS — E8809 Other disorders of plasma-protein metabolism, not elsewhere classified: Secondary | ICD-10-CM

## 2018-03-23 DIAGNOSIS — K922 Gastrointestinal hemorrhage, unspecified: Secondary | ICD-10-CM

## 2018-03-23 MED ORDER — LORAZEPAM 2 MG/ML IJ SOLN
1.0000 mg | Freq: Once | INTRAMUSCULAR | Status: AC
  Administered 2018-03-23: 1 mg via INTRAVENOUS

## 2018-03-23 MED ORDER — LORAZEPAM 2 MG/ML IJ SOLN
1.0000 mg | INTRAMUSCULAR | Status: DC | PRN
  Administered 2018-03-23 – 2018-03-24 (×4): 1 mg via INTRAVENOUS
  Filled 2018-03-23 (×3): qty 1

## 2018-03-23 MED ORDER — LORAZEPAM 2 MG/ML IJ SOLN
INTRAMUSCULAR | Status: AC
  Filled 2018-03-23: qty 1

## 2018-03-23 MED ORDER — LORAZEPAM 2 MG/ML IJ SOLN
INTRAMUSCULAR | Status: AC
  Administered 2018-03-23: 1 mg
  Filled 2018-03-23: qty 1

## 2018-03-23 NOTE — Progress Notes (Signed)
PROGRESS NOTE    Jennifer Moore  ZOX:[id number] DOB: 1981-08-24 DOA: 03/10/2018 PCP: Elliot Cousin., MD    Brief Narrative:  Patient is a 37 year old female with past with history of anxiety, current alcohol abuse with alcoholic cirrhosis presents from home 4/23 via EMS for altered mental status and epistaxis.  Patient had epistaxis which would not stop and boyfriend called EMS. She was admitted to Critical care  And intubated for airway protection on  4/23 and extubated on 4/25 when her encephalopathy improved.  She underwent Right IJ placed on 4/23.  She was transferred to [facility] service on 4/27.     Assessment & Plan:   Active Problems:   Substance use disorder   Alcohol use disorder, severe, dependence (HCC)   Anemia   Hyperammonemia (HCC)   Alcoholic liver disease (HCC)   Hypomagnesemia   GI bleed   Thrombocytopenia (HCC)   Hypokalemia   Coagulopathy (HCC)   Hypoalbuminemia   Acute hepatic encephalopathy/ acute metabolic encephalopathy secondary to alcohol abuse:  Improved but not resolved. She is still confused, not able to answer any questions.   Her ammonia levels continued to be high. Last level at 110. Would recommend to continue with lactulose and monitor her mental status and ammonia level.  Hyponatremia resolved.    Acute anemia of blood loss sec to upper GI bleed possibly from alcohol induced.  GI consulted and pt underwent EGD on 4/24 showed Portal hypertensive gastropathy. Normal duodenal bulb, first portion of the duodenum and second portion of the duodenum. Transfuse to keep hemoglobin greater than 7.  ETOH abstinence counseling.  Resume pepcid, change to oral once she is able to take oral .  No signs of bleeding at this time.    Alcoholic cirrhosis, ascites.  - resume rocephin for prophylaxis for SBP.  - resume rifaximin, folate and thiamine.  - pt denies any abdominal pain .    Hyponatremia, hypokalemia, hypomagnesemia: replaced.     Metabolic acidosis: Improved.    Anemia, thrombocytopenia:  Secondary to liver cirrhosis.  Elevated INR . Some bleeding from the site fo the right IJ when it was removed earlier today. A thrombin pad was used to stop the bleeding.  Anemia panel shows low folate levels, and is being replaced.    Nutrition: SLP evaluated and recommended Dysphagia 1 diet.   In view of her decline clinically, without much improvement in the last 48 hours, recommend palliative care consult for goals of care.    DVT prophylaxis: SCD;S Code Status: full code.  Family Communication: none at bedside.  Disposition Plan: pending clinical improvement in her mental status.    Consultants:   Gastroenterology.   Procedures:egd showing gastropathy.   Antimicrobials: rocephin since admission.   Subjective: Confused, needed restraints overnight.    Objective: Vitals:   03/23/18 0900 03/23/18 1000 03/23/18 1100 03/23/18 1200  BP:  (!) 84/56 (!) 113/54 112/71  Pulse: 96 (!) 102 100 100  Resp: (!) 21 (!) 22 (!) 24 (!) 25  Temp:    (!) 97.2 F (36.2 C)  TempSrc:    Oral  SpO2: 98% 95% 96% 92%  Weight:      Height:        Intake/Output Summary (Last 24 hours) at 03/23/2018 1330 Last data filed at 03/23/2018 0800 Gross per 24 hour  Intake 510 ml  Output 225 ml  Net 285 ml   Filed Weights   03/20/18 0600 03/21/18 0451 03/22/18 0400  Weight: 75 kg (165  lb 5.5 oz) 73.1 kg (161 lb 2.5 oz) 74.1 kg (163 lb 5.8 oz)    Examination:  General exam: confused, restless.  Respiratory system: air entry fair , no wheezing or rhonchi.  Cardiovascular system: S1 & S2 heard, RRR.  No pedal edema. Gastrointestinal system: Abdomen is distended, soft, non tender.  Rectal tube in place.  Central nervous system: Alert but confused, not able to answer any questions or hold conversation today.  Extremities: trace edema.  Skin: No rashes, lesions or ulcers Psychiatry: restless. Confused.     Data Reviewed:  I have personally reviewed following labs and imaging studies  CBC: Recent Labs  Lab 03/24/2018 1834 03/05/2018 0523  03/19/18 0432 03/19/18 0944 03/19/18 1540 03/20/18 0500 03/21/18 0427  WBC 11.8* 7.8   < > 9.2 9.1 8.5 9.1 7.7  NEUTROABS 9.8* 6.0  --   --   --   --   --   --   HGB 6.6* 5.5*   < > 9.1* 8.9* 8.5* 8.9* 8.5*  HCT 19.0* 15.7*   < > 26.1* 25.6* 24.3* 26.0* 25.4*  MCV 115.9* 101.9*   < > 94.2 95.5 95.7 97.4 99.6  PLT 175 112*   < > 121* 117* 115* 111* 106*   < > = values in this interval not displayed.   Basic Metabolic Panel: Recent Labs  Lab 03/02/2018 1834 03/17/2018 0523 03/19/18 0432 03/20/18 0500 03/21/18 0427  NA 131* 134* 133* 136 140  K 2.3* 2.4* 3.0* 4.1 4.3  CL 100* 107 105 110 112*  CO2 18* 21* 20* 20* 21*  GLUCOSE 140* 100* 105* 92 94  BUN CREATININE 0.57 0.51 0.35* <0.30* 0.32*  CALCIUM 7.9* 7.3* 8.0* 8.3* 8.7*  MG 1.3* 1.7 1.5* 1.8 2.4  PHOS  --  2.4* 3.2 4.4 5.0*   GFR: Estimated Creatinine Clearance: 81.8 mL/min (A) (by C-G formula based on SCr of 0.32 mg/dL (L)). Liver Function Tests: Recent Labs  Lab 03/02/2018 1834 03/07/2018 0523 03/20/18 0500  AST 67* 51* 68*  ALT ALKPHOS 83 58 69  BILITOT 21.1* 17.8* 21.9*  PROT 6.7 5.4* 5.9*  ALBUMIN 1.6* 1.8* 1.7*   Recent Labs  Lab 03/01/2018 2042  LIPASE 33  AMYLASE 67   Recent Labs  Lab 02/25/2018 1834 03/19/18 0432 03/21/18 0429  AMMONIA 110* 121* 110*   Coagulation Profile: Recent Labs  Lab 03/20/2018 2019 03/10/2018 0523 03/19/18 0432 03/20/18 0500  INR >10.00* 2.14 2.36 2.90   Cardiac Enzymes: Recent Labs  Lab 03/10/2018 2042  CKTOTAL 23*   BNP (last 3 results) No results for input(s): PROBNP in the last 8760 hours. HbA1C: No results for input(s): HGBA1C in the last 72 hours. CBG: No results for input(s): GLUCAP in the last 168 hours. Lipid Profile: No results for input(s): CHOL, HDL, LDLCALC, TRIG, CHOLHDL, LDLDIRECT in the last 72 hours. Thyroid  Function Tests: No results for input(s): TSH, T4TOTAL, FREET4, T3FREE, THYROIDAB in the last 72 hours. Anemia Panel: Recent Labs    03/21/18 0427 03/21/18 1347  VITAMINB12  --  6,780*  FOLATE  --  5.7*  FERRITIN  --  1,015*  TIBC  --  NOT CALCULATED  IRON  --  97  RETICCTPCT 4.0*  --    Sepsis Labs: Recent Labs  Lab 02/25/2018 0522 02/26/2018 0523  PROCALCITON 0.44  --   LATICACIDVEN  --  1.7    Recent Results (from the past 240 hour(s))  MRSA PCR Screening     Status: None   Collection Time: 03/10/2018  4:34 AM  Result Value Ref Range Status   MRSA by PCR NEGATIVE NEGATIVE Final    Comment:        The GeneXpert MRSA Assay (FDA approved for NASAL specimens only), is one component of a comprehensive MRSA colonization surveillance program. It is not intended to diagnose MRSA infection nor to guide or monitor treatment for MRSA infections. Performed at [facility], [address] [place], [place] [address]          Radiology Studies: No results found.      Scheduled Meds: . chlorhexidine  15 mL Mouth Rinse BID  . Chlorhexidine Gluconate Cloth  6 each Topical Daily  . folic acid  1 mg Intravenous Daily  . furosemide  40 mg Oral Daily  . lactulose  30 g Per Tube QID  . mouth rinse  15 mL Mouth Rinse q12n4p  . multivitamin with minerals  1 tablet Oral Daily  . prednisoLONE  40 mg Oral Q breakfast  . rifaximin  550 mg Oral BID  . sodium chloride flush  10-40 mL Intracatheter Q12H  . sodium chloride flush  10-40 mL Intracatheter Q12H  . spironolactone  100 mg Oral Daily  . thiamine injection  100 mg Intravenous Q24H  . THROMBI-PAD  1 each Topical Once   Continuous Infusions: . sodium chloride    . cefTRIAXone (ROCEPHIN)  IV Stopped (03/23/18 0510)  . famotidine (PEPCID) IV Stopped (03/23/18 1315)     LOS: 6 days    Time spent: 35 MINUTES.     Kathlen Mody, MD [facility] Pager [phone]  If 7PM-7AM, please contact  night-coverage [email or web address] Password [email or web address] 03/23/2018, 1:30 PM

## 2018-03-23 NOTE — Evaluation (Signed)
SLP Cancellation Note  Patient Details Name: Jennifer Moore MRN: [id number] DOB: 1981/11/06   Cancelled treatment:       Reason Eval/Treat Not Completed: Other (comment)(pt lethargic, will continue efforts)   Chales Abrahams 03/23/2018, 1:10 PM   Donavan Burnet, MS [facility] SLP [phone]

## 2018-03-23 NOTE — Progress Notes (Signed)
Pt having multiple BM overnight with agitation. Attempting to pull at heart monitor, IV, etc. Pt throwing legs over the side rail and attempting to get out of bed. Ativan ordered x1 with no improvement. Restraints ordered when pt attempted to get out of bed for the 7th time after ativan was given. PRN ativan ordered for continuous agitation.

## 2018-03-23 NOTE — Progress Notes (Signed)
[facility] Progress Note  Subjective: Patient with anemia from epistaxis EGD was negative. She has cirrhosis of the liver and also is being treated for alcoholic hepatitis and hepatic encephalopathy. She remains confused.  Objective: Vital signs in last 24 hours: Temp:  [97.4 F (36.3 C)-98.9 F (37.2 C)] 98.9 F (37.2 C) (04/29 0800) Pulse Rate:  [89-115] 90 (04/29 0600) Resp:  [17-30] 20 (04/29 0600) BP: (97-122)/(45-77) 118/54 (04/29 0600) SpO2:  [91 %-100 %] 91 % (04/29 0600) Weight change:    PE:  No distress  Heart regular rhythm  Lungs clear  Abdomen soft nontender    Lab Results: No results found for this or any previous visit (from the past 24 hour(s)).  Studies/Results: No results found.    Assessment: Alcoholic cirrhosis  Alcoholic hepatitis  Hepatic encephalopathy  Ascites    Plan:   Currently on treatment with Xifaxan, lactulose, prednisolone. Also was started on furosemide and Aldactone for ascites. Continue current management and follow clinically.    SAM F GANEM 03/23/2018, 10:10 AM  Pager: [phone] If no answer or after 5 PM call [phone]

## 2018-03-23 NOTE — Progress Notes (Signed)
PT Cancellation Note  Patient Details Name: Jennifer Moore MRN: [id number] DOB: 07/18/1981   Cancelled Treatment:    Reason Eval/Treat Not Completed: Medical issues which prohibited therapy -per RN, agitation  Rada Hay 03/23/2018, 11:35 AM  Blanchard Kelch PT [phone]

## 2018-03-24 ENCOUNTER — Inpatient Hospital Stay: Payer: Self-pay

## 2018-03-24 ENCOUNTER — Inpatient Hospital Stay (HOSPITAL_COMMUNITY): Payer: Medicaid Other

## 2018-03-24 ENCOUNTER — Other Ambulatory Visit: Payer: Self-pay

## 2018-03-24 DIAGNOSIS — F1990 Other psychoactive substance use, unspecified, uncomplicated: Secondary | ICD-10-CM

## 2018-03-24 DIAGNOSIS — R579 Shock, unspecified: Secondary | ICD-10-CM

## 2018-03-24 DIAGNOSIS — I959 Hypotension, unspecified: Secondary | ICD-10-CM

## 2018-03-24 DIAGNOSIS — K7011 Alcoholic hepatitis with ascites: Secondary | ICD-10-CM

## 2018-03-24 DIAGNOSIS — K7290 Hepatic failure, unspecified without coma: Secondary | ICD-10-CM

## 2018-03-24 DIAGNOSIS — Z789 Other specified health status: Secondary | ICD-10-CM

## 2018-03-24 DIAGNOSIS — R17 Unspecified jaundice: Secondary | ICD-10-CM

## 2018-03-24 DIAGNOSIS — K7682 Hepatic encephalopathy: Secondary | ICD-10-CM

## 2018-03-24 LAB — BLOOD GAS, ARTERIAL
Acid-base deficit: 19.3 mmol/L — ABNORMAL HIGH (ref 0.0–2.0)
Bicarbonate: 8.6 mmol/L — ABNORMAL LOW (ref 20.0–28.0)
Bicarbonate: 16.1 mmol/L — ABNORMAL LOW (ref 20.0–28.0)
DRAWN BY: 422461
FIO2: 100
FIO2: 50
MECHVT: 310 mL
MECHVT: 400 mL
O2 SAT: 100 %
O2 Saturation: 99.3 %
PEEP: 5 cmH2O
PEEP: 5 cmH2O
PH ART: 7.308 — AB (ref 7.350–7.450)
PO2 ART: 234 mmHg — AB (ref 83.0–108.0)
Patient temperature: 98.6
Patient temperature: 34.3
RATE: 18 resp/min
RATE: 22 resp/min
pCO2 arterial: 29.7 mmHg — ABNORMAL LOW (ref 32.0–48.0)
pCO2 arterial: 31.8 mmHg — ABNORMAL LOW (ref 32.0–48.0)
pH, Arterial: 7.09 — CL (ref 7.350–7.450)
pO2, Arterial: 294 mmHg — ABNORMAL HIGH (ref 83.0–108.0)

## 2018-03-24 LAB — COMPREHENSIVE METABOLIC PANEL
ALBUMIN: 1.5 g/dL — AB (ref 3.5–5.0)
ALK PHOS: 39 U/L (ref 38–126)
ALT: 50 U/L (ref 14–54)
ANION GAP: 21 — AB (ref 5–15)
AST: 273 U/L — ABNORMAL HIGH (ref 15–41)
BUN: 24 mg/dL — ABNORMAL HIGH (ref 6–20)
CALCIUM: 8.7 mg/dL — AB (ref 8.9–10.3)
CO2: 9 mmol/L — ABNORMAL LOW (ref 22–32)
Chloride: 111 mmol/L (ref 101–111)
Creatinine, Ser: 1.61 mg/dL — ABNORMAL HIGH (ref 0.44–1.00)
GFR, EST AFRICAN AMERICAN: 47 mL/min — AB (ref >60)
GFR, EST NON AFRICAN AMERICAN: 41 mL/min — AB (ref >60)
GLUCOSE: 160 mg/dL — AB (ref 65–99)
POTASSIUM: 4.5 mmol/L (ref 3.5–5.1)
Sodium: 141 mmol/L (ref 135–145)
TOTAL PROTEIN: 3.9 g/dL — AB (ref 6.5–8.1)
Total Bilirubin: 14.4 mg/dL — ABNORMAL HIGH (ref 0.3–1.2)

## 2018-03-24 LAB — DIC (DISSEMINATED INTRAVASCULAR COAGULATION) PANEL: PROTHROMBIN TIME: 30.1 s — AB (ref 11.4–15.2)

## 2018-03-24 LAB — CBC
HCT: 15.9 % — ABNORMAL LOW (ref 36.0–46.0)
HCT: 9.6 % — ABNORMAL LOW (ref 36.0–46.0)
HEMOGLOBIN: 5.4 g/dL — AB (ref 12.0–15.0)
Hemoglobin: 3.1 g/dL — CL (ref 12.0–15.0)
MCH: 34.4 pg — AB (ref 26.0–34.0)
MCH: 29.8 pg (ref 26.0–34.0)
MCHC: 34 g/dL (ref 30.0–36.0)
MCHC: 32.3 g/dL (ref 30.0–36.0)
MCV: 101.3 fL — AB (ref 78.0–100.0)
MCV: 92.3 fL (ref 78.0–100.0)
PLATELETS: 45 10*3/uL — AB (ref 150–400)
Platelets: 138 10*3/uL — ABNORMAL LOW (ref 150–400)
RBC: 1.57 MIL/uL — AB (ref 3.87–5.11)
RBC: 1.04 MIL/uL — AB (ref 3.87–5.11)
RDW: 25.8 % — ABNORMAL HIGH (ref 11.5–15.5)
RDW: 21.4 % — ABNORMAL HIGH (ref 11.5–15.5)
WBC: 12.3 10*3/uL — ABNORMAL HIGH (ref 4.0–10.5)
WBC: 15.8 10*3/uL — ABNORMAL HIGH (ref 4.0–10.5)

## 2018-03-24 LAB — BASIC METABOLIC PANEL
ANION GAP: 8 (ref 5–15)
BUN: 24 mg/dL — ABNORMAL HIGH (ref 6–20)
CALCIUM: 9.6 mg/dL (ref 8.9–10.3)
CHLORIDE: 114 mmol/L — AB (ref 101–111)
CO2: 21 mmol/L — AB (ref 22–32)
Creatinine, Ser: 0.77 mg/dL (ref 0.44–1.00)
GFR calc non Af Amer: >60 mL/min (ref >60)
Glucose, Bld: 114 mg/dL — ABNORMAL HIGH (ref 65–99)
POTASSIUM: 4.3 mmol/L (ref 3.5–5.1)
Sodium: 143 mmol/L (ref 135–145)

## 2018-03-24 LAB — PREGNANCY, URINE: PREG TEST UR: NEGATIVE

## 2018-03-24 LAB — FIBRINOGEN: FIBRINOGEN: 80 mg/dL — AB (ref 210–475)

## 2018-03-24 LAB — PREPARE RBC (CROSSMATCH)

## 2018-03-24 LAB — URINE CULTURE: Culture: NO GROWTH

## 2018-03-24 LAB — HEPATIC FUNCTION PANEL
ALT: 34 U/L (ref 14–54)
AST: 101 U/L — ABNORMAL HIGH (ref 15–41)
Albumin: 1.5 g/dL — ABNORMAL LOW (ref 3.5–5.0)
Alkaline Phosphatase: 57 U/L (ref 38–126)
BILIRUBIN INDIRECT: 9.2 mg/dL — AB (ref 0.3–0.9)
Bilirubin, Direct: 13.9 mg/dL — ABNORMAL HIGH (ref 0.1–0.5)
TOTAL PROTEIN: 5.4 g/dL — AB (ref 6.5–8.1)
Total Bilirubin: 23.1 mg/dL (ref 0.3–1.2)

## 2018-03-24 LAB — HCG, SERUM, QUALITATIVE: PREG SERUM: NEGATIVE

## 2018-03-24 LAB — DIC (DISSEMINATED INTRAVASCULAR COAGULATION)PANEL
D-Dimer, Quant: >20 ug/mL-FEU — ABNORMAL HIGH (ref 0.00–0.50)
Fibrinogen: 80 mg/dL — CL (ref 210–475)
INR: 2.9
Platelets: 45 10*3/uL — ABNORMAL LOW (ref 150–400)
aPTT: 78 seconds — ABNORMAL HIGH (ref 24–36)

## 2018-03-24 LAB — PROTIME-INR
INR: 2.9
PROTHROMBIN TIME: 30.1 s — AB (ref 11.4–15.2)

## 2018-03-24 LAB — HEMOGLOBIN AND HEMATOCRIT, BLOOD
HCT: 8.8 % — ABNORMAL LOW (ref 36.0–46.0)
HEMOGLOBIN: 2.8 g/dL — AB (ref 12.0–15.0)

## 2018-03-24 LAB — HCG, QUANTITATIVE, PREGNANCY

## 2018-03-24 LAB — AMMONIA: AMMONIA: 58 umol/L — AB (ref 9–35)

## 2018-03-24 LAB — MAGNESIUM: MAGNESIUM: 2 mg/dL (ref 1.7–2.4)

## 2018-03-24 LAB — PHOSPHORUS: Phosphorus: 8.5 mg/dL — ABNORMAL HIGH (ref 2.5–4.6)

## 2018-03-24 LAB — TROPONIN I: TROPONIN I: 0.13 ng/mL — AB (ref <0.03)

## 2018-03-24 LAB — LACTIC ACID, PLASMA: LACTIC ACID, VENOUS: 16.2 mmol/L — AB (ref 0.5–1.9)

## 2018-03-24 LAB — PROCALCITONIN: PROCALCITONIN: 0.45 ng/mL

## 2018-03-24 MED ORDER — "THROMBI-PAD 3""X3"" EX PADS"
1.0000 | MEDICATED_PAD | Freq: Once | CUTANEOUS | Status: AC
  Administered 2018-03-24: 1 via TOPICAL
  Filled 2018-03-24: qty 1

## 2018-03-24 MED ORDER — SODIUM CHLORIDE 0.9 % IV BOLUS: 500.0000 mL | Freq: Once | INTRAVENOUS | Status: DC

## 2018-03-24 MED ORDER — SODIUM CHLORIDE 0.9 % IV BOLUS
500.0000 mL | Freq: Once | INTRAVENOUS | Status: AC
  Administered 2018-03-24: 500 mL via INTRAVENOUS

## 2018-03-24 MED ORDER — SODIUM CHLORIDE 0.9 % IV SOLN: Freq: Once | INTRAVENOUS | Status: DC

## 2018-03-24 MED ORDER — FENTANYL CITRATE (PF) 100 MCG/2ML IJ SOLN
100.0000 ug | INTRAMUSCULAR | Status: AC | PRN
  Administered 2018-03-24 – 2018-03-25 (×3): 100 ug via INTRAVENOUS
  Filled 2018-03-24 (×2): qty 2

## 2018-03-24 MED ORDER — VASOPRESSIN 20 UNIT/ML IV SOLN
0.0400 [IU]/min | INTRAVENOUS | Status: DC
  Filled 2018-03-24: qty 2

## 2018-03-24 MED ORDER — DEXTROSE 5 % IV SOLN
0.0000 ug/min | INTRAVENOUS | Status: DC
  Administered 2018-03-24 (×2): 32 ug/min via INTRAVENOUS
  Administered 2018-03-24: 2 ug/min via INTRAVENOUS
  Filled 2018-03-24 (×4): qty 4

## 2018-03-24 MED ORDER — NOREPINEPHRINE 16 MG/250ML-% IV SOLN
0.0000 ug/min | INTRAVENOUS | Status: DC
  Administered 2018-03-24: 70 ug/min via INTRAVENOUS
  Administered 2018-03-25: 20 ug/min via INTRAVENOUS
  Administered 2018-03-25: 32 ug/min via INTRAVENOUS
  Filled 2018-03-24 (×4): qty 250

## 2018-03-24 MED ORDER — SODIUM BICARBONATE 8.4 % IV SOLN
INTRAVENOUS | Status: DC
  Administered 2018-03-24 – 2018-03-26 (×7): via INTRAVENOUS
  Filled 2018-03-24 (×10): qty 150

## 2018-03-24 MED ORDER — EPINEPHRINE PF 1 MG/ML IJ SOLN
0.5000 ug/min | INTRAMUSCULAR | Status: DC
  Administered 2018-03-24: 20 ug/min via INTRAVENOUS
  Administered 2018-03-25: 5 ug/min via INTRAVENOUS
  Administered 2018-03-25: 10 ug/min via INTRAVENOUS
  Filled 2018-03-24 (×3): qty 4

## 2018-03-24 MED ORDER — SODIUM BICARBONATE 8.4 % IV SOLN
100.0000 meq | Freq: Once | INTRAVENOUS | Status: AC
  Administered 2018-03-24: 100 meq via INTRAVENOUS
  Filled 2018-03-24: qty 100

## 2018-03-24 MED ORDER — VITAMIN K1 10 MG/ML IJ SOLN
10.0000 mg | Freq: Once | INTRAVENOUS | Status: AC
  Administered 2018-03-25: 10 mg via INTRAVENOUS
  Filled 2018-03-24 (×3): qty 1

## 2018-03-24 MED ORDER — SODIUM CHLORIDE 0.9 % IV BOLUS
1000.0000 mL | Freq: Once | INTRAVENOUS | Status: AC
  Administered 2018-03-24: 1000 mL via INTRAVENOUS

## 2018-03-24 MED ORDER — ETOMIDATE 2 MG/ML IV SOLN
20.0000 mg | Freq: Once | INTRAVENOUS | Status: AC
  Administered 2018-03-24: 20 mg via INTRAVENOUS

## 2018-03-24 MED ORDER — VITAMIN K1 10 MG/ML IJ SOLN
10.0000 mg | Freq: Every day | INTRAVENOUS | Status: AC
  Administered 2018-03-24 – 2018-03-26 (×3): 10 mg via INTRAVENOUS
  Filled 2018-03-24 (×4): qty 1

## 2018-03-24 MED ORDER — SODIUM CHLORIDE 0.9 % IV SOLN
Freq: Once | INTRAVENOUS | Status: AC
  Administered 2018-03-24: 09:00:00 via INTRAVENOUS

## 2018-03-24 MED ORDER — HYDROCORTISONE NA SUCCINATE PF 100 MG IJ SOLR
50.0000 mg | Freq: Four times a day (QID) | INTRAMUSCULAR | Status: DC
  Administered 2018-03-24 – 2018-03-27 (×11): 50 mg via INTRAVENOUS
  Filled 2018-03-24 (×2): qty 1
  Filled 2018-03-24: qty 2
  Filled 2018-03-24: qty 1
  Filled 2018-03-24: qty 2
  Filled 2018-03-24 (×7): qty 1

## 2018-03-24 MED ORDER — INSULIN ASPART 100 UNIT/ML ~~LOC~~ SOLN
1.0000 [IU] | SUBCUTANEOUS | Status: DC
  Administered 2018-03-25: 2 [IU] via SUBCUTANEOUS
  Administered 2018-03-25: 3 [IU] via SUBCUTANEOUS
  Administered 2018-03-25: 2 [IU] via SUBCUTANEOUS

## 2018-03-24 MED ORDER — MIDAZOLAM HCL 2 MG/2ML IJ SOLN
INTRAMUSCULAR | Status: AC
  Administered 2018-03-24: 1 mg
  Filled 2018-03-24: qty 4

## 2018-03-24 MED ORDER — IOPAMIDOL (ISOVUE-300) INJECTION 61%
INTRAVENOUS | Status: AC
  Administered 2018-03-24: 30 mL
  Filled 2018-03-24: qty 30

## 2018-03-24 MED ORDER — FENTANYL CITRATE (PF) 100 MCG/2ML IJ SOLN
INTRAMUSCULAR | Status: AC
  Filled 2018-03-24: qty 4

## 2018-03-24 MED ORDER — FENTANYL CITRATE (PF) 100 MCG/2ML IJ SOLN
100.0000 ug | INTRAMUSCULAR | Status: DC | PRN
  Administered 2018-03-25 – 2018-03-27 (×15): 100 ug via INTRAVENOUS
  Filled 2018-03-24 (×15): qty 2

## 2018-03-24 NOTE — Progress Notes (Signed)
PROGRESS NOTE    Jennifer Moore  ZOX:[id number] DOB: 1981/03/25 DOA: 02/26/2018 PCP: Elliot Cousin., MD    Brief Narrative:  Patient is a 37 year old female with past with history of anxiety, current alcohol abuse with alcoholic cirrhosis presents from home 4/23 via EMS for altered mental status and epistaxis.  Patient had epistaxis which would not stop and boyfriend called EMS. She was admitted to Critical care  And intubated for airway protection on  4/23 and extubated on 4/25 when her encephalopathy improved.  She underwent Right IJ placed on 4/23.  She was transferred to [facility] service on 4/27.     Assessment & Plan:   Active Problems:   Substance use disorder   Alcohol use disorder, severe, dependence (HCC)   Anemia   Hyperammonemia (HCC)   Alcoholic liver disease (HCC)   Hypomagnesemia   GI bleed   Thrombocytopenia (HCC)   Hypokalemia   Coagulopathy (HCC)   Hypoalbuminemia   Acute hepatic encephalopathy/ acute metabolic encephalopathy secondary to alcohol abuse:  Improved but not resolved. She is more confused today than yesterday, restless and in restraints, not able to answer any questions Her ammonia levels have improved from 110 to 58.  Would recommend to continue with lactulose and monitor her mental status and ammonia level.  Hyponatremia resolved.    Acute anemia of blood loss sec to upper GI bleed possibly from alcohol induced.  GI consulted and pt underwent EGD on 4/24 showed Portal hypertensive gastropathy. Normal duodenal bulb, first portion of the duodenum and second portion of the duodenum. Her hemoglobin this am was 5.4, without any signs of gi bleeding or obvious bleeding. 4 units of prbc transfusion ordered and repeat H&H post transfusion. So far only one unit was given.  Transfuse to keep hemoglobin greater than 7.  ETOH abstinence counseling. Resume pepcid, change to oral once she is able to take oral .  GI on board and following the patient.     Alcoholic cirrhosis, ascites.  - abdomen looks more distended, unclear if she is bleeding in to her abdomen vs ascites. So obtain a CT abdomen and pelvis for further evaluation.  - resume rocephin for prophylaxis for SBP.  - resume rifaximin, folate and thiamine.     Hyponatremia, hypokalemia, hypomagnesemia: replaced.    Metabolic acidosis: Improved.    Anemia, thrombocytopenia:  Secondary to liver cirrhosis and bleeding. Elevated INR . Some bleeding from the site fo the right IJ when it was removed. A thrombin pad was used to stop the bleeding.  Anemia panel shows low folate levels, and is being replaced.    Nutrition: SLP evaluated and recommended Dysphagia 1 diet.   Urinary retention:  Foley placed.    Hypotension:  Possibly from intravascular volume depletion from third spacing vs  Suspect hemorrhagic shock.    In view of her decline clinically, without much improvement in the last 48 hours, recommend palliative care consult for goals of care.    DVT prophylaxis: SCD;S Code Status: full code.  Family Communication: none at bedside. Discussed with her significant other over the phone and discussed the poor prognosis.  Disposition Plan: pending clinical improvement in her mental status.    Consultants:   Gastroenterology.   Procedures:egd showing gastropathy.   Antimicrobials: rocephin since admission.   Subjective: More confused, restless on restraints today.    Objective: Vitals:   03/24/18 1000 03/24/18 1030 03/24/18 1100 03/24/18 1130  BP: (!) 85/29 (!) 81/35 (!) 117/35 (!) 87/50  Pulse:  97 95 94 94  Resp: (!) 33 (!) 33 (!) 26 (!) 23  Temp:    (!) 95.9 F (35.5 C)  TempSrc:    Axillary  SpO2: 99% 100% 100% 100%  Weight:      Height:        Intake/Output Summary (Last 24 hours) at 03/24/2018 1153 Last data filed at 03/24/2018 1130 Gross per 24 hour  Intake 840 ml  Output 200 ml  Net 640 ml   Filed Weights   03/21/18 0451 03/22/18  0400 03/24/18 0100  Weight: 73.1 kg (161 lb 2.5 oz) 74.1 kg (163 lb 5.8 oz) 75.3 kg (166 lb 0.1 oz)    Examination:  General exam: confused, restless. In restraints on RA with sats in upper 90's Respiratory system: diminished at bases, no wheezing or rhonchi.  Cardiovascular system: S1 & S2 heard, RRR. 1+ pedal edema.  Gastrointestinal system: Abdomen is more distended,  soft, non tender.  Rectal tube in place.  Central nervous system: somnolent,  not able to answer any questions or hold conversation today.  Extremities: trace edema.  Skin: No rashes, lesions or ulcers Psychiatry: restless. Confused.     Data Reviewed: I have personally reviewed following labs and imaging studies  CBC: Recent Labs  Lab Apr 15, 2018 1834 02/23/2018 0523  03/19/18 0944 03/19/18 1540 03/20/18 0500 03/21/18 0427 03/24/18 0346  WBC 11.8* 7.8   < > 9.1 8.5 9.1 7.7 12.3*  NEUTROABS 9.8* 6.0  --   --   --   --   --   --   HGB 6.6* 5.5*   < > 8.9* 8.5* 8.9* 8.5* 5.4*  HCT 19.0* 15.7*   < > 25.6* 24.3* 26.0* 25.4* 15.9*  MCV 115.9* 101.9*   < > 95.5 95.7 97.4 99.6 101.3*  PLT 175 112*   < > 117* 115* 111* 106* 138*   < > = values in this interval not displayed.   Basic Metabolic Panel: Recent Labs  Lab 04-15-2018 1834 03/05/2018 0523 03/19/18 0432 03/20/18 0500 03/21/18 0427 03/24/18 0346  NA 131* 134* 133* 136 140 143  K 2.3* 2.4* 3.0* 4.1 4.3 4.3  CL 100* 107 105 110 112* 114*  CO2 18* 21* 20* 20* 21* 21*  GLUCOSE 140* 100* 105* 92 94 114*  BUN 24*  CREATININE 0.57 0.51 0.35* <0.30* 0.32* 0.77  CALCIUM 7.9* 7.3* 8.0* 8.3* 8.7* 9.6  MG 1.3* 1.7 1.5* 1.8 2.4  --   PHOS  --  2.4* 3.2 4.4 5.0*  --    GFR: Estimated Creatinine Clearance: 82.6 mL/min (by C-G formula based on SCr of 0.77 mg/dL). Liver Function Tests: Recent Labs  Lab Apr 15, 2018 1834 03/01/2018 0523 03/20/18 0500  AST 67* 51* 68*  ALT ALKPHOS 83 58 69  BILITOT 21.1* 17.8* 21.9*  PROT 6.7 5.4* 5.9*  ALBUMIN  1.6* 1.8* 1.7*   Recent Labs  Lab 04-15-18 2042  LIPASE 33  AMYLASE 67   Recent Labs  Lab 04/15/18 1834 03/19/18 0432 03/21/18 0429 03/24/18 0649  AMMONIA 110* 121* 110* 58*   Coagulation Profile: Recent Labs  Lab 04-15-18 2019 03/17/2018 0523 03/19/18 0432 03/20/18 0500  INR >10.00* 2.14 2.36 2.90   Cardiac Enzymes: Recent Labs  Lab 04-15-2018 2042  CKTOTAL 23*   BNP (last 3 results) No results for input(s): PROBNP in the last 8760 hours. HbA1C: No results for input(s): HGBA1C in the last 72 hours. CBG: No results for  input(s): GLUCAP in the last 168 hours. Lipid Profile: No results for input(s): CHOL, HDL, LDLCALC, TRIG, CHOLHDL, LDLDIRECT in the last 72 hours. Thyroid Function Tests: No results for input(s): TSH, T4TOTAL, FREET4, T3FREE, THYROIDAB in the last 72 hours. Anemia Panel: Recent Labs    03/21/18 1347  VITAMINB12 6,780*  FOLATE 5.7*  FERRITIN 1,015*  TIBC NOT CALCULATED  IRON 97   Sepsis Labs: Recent Labs  Lab 03/14/2018 0522 02/23/2018 0523  PROCALCITON 0.44  --   LATICACIDVEN  --  1.7    Recent Results (from the past 240 hour(s))  MRSA PCR Screening     Status: None   Collection Time: 03/19/2018  4:34 AM  Result Value Ref Range Status   MRSA by PCR NEGATIVE NEGATIVE Final    Comment:        The GeneXpert MRSA Assay (FDA approved for NASAL specimens only), is one component of a comprehensive MRSA colonization surveillance program. It is not intended to diagnose MRSA infection nor to guide or monitor treatment for MRSA infections. Performed at [facility], [address], [place], [place] [address]   Culture, Urine     Status: None   Collection Time: 03/21/18  1:47 PM  Result Value Ref Range Status   Specimen Description   Final    URINE, RANDOM Performed at [facility], [address], [place], [place] [address]    Special Requests   Final    NONE Performed at [facility], [address] [place], [place] [address]    Culture   Final    NO GROWTH Performed at [facility], [address], [place], [place] [address]    Report Status 03/24/2018 FINAL  Final         Radiology Studies: No results found.      Scheduled Meds: . chlorhexidine  15 mL Mouth Rinse BID  . Chlorhexidine Gluconate Cloth  6 each Topical Daily  . folic acid  1 mg Intravenous Daily  . furosemide  40 mg Oral Daily  . lactulose  30 g Per Tube QID  . mouth rinse  15 mL Mouth Rinse q12n4p  . multivitamin with minerals  1 tablet Oral Daily  . prednisoLONE  40 mg Oral Q breakfast  . rifaximin  550 mg Oral BID  . spironolactone  100 mg Oral Daily  . thiamine injection  100 mg Intravenous Q24H  . THROMBI-PAD  1 each Topical Once   Continuous Infusions: . sodium chloride    . cefTRIAXone (ROCEPHIN)  IV Stopped (03/24/18 0530)  . famotidine (PEPCID) IV Stopped (03/23/18 2304)  . sodium chloride    . sodium chloride       LOS: 7 days    Time spent: 35 MINUTES.     Kathlen Mody, MD [facility] Pager [phone]  If 7PM-7AM, please contact night-coverage [email or web address] Password [facility] 03/24/2018, 11:53 AM

## 2018-03-24 NOTE — Progress Notes (Signed)
[facility] Progress Note  Subjective: Patient confused and agitated. Not responding to verbal commands.  She is being treated for alcoholic cirrhosis of the liver. She is also being treated for alcoholic hepatitis. She is also being treated for hepatic encephalopathy.  Her hemoglobin has dropped to 5.4 although there was no sign of active GI bleeding. Recent EGD negative. Most recent total bilirubin was 21.9. INR 2.36.  Objective: Vital signs in last 24 hours: Temp:  [96.4 F (35.8 C)-97.9 F (36.6 C)] 96.4 F (35.8 C) (04/30 0842) Pulse Rate:  [87-111] 101 (04/30 0635) Resp:  [0-36] 25 (04/30 0635) BP: (84-120)/(42-87) 92/42 (04/30 0827) SpO2:  [92 %-100 %] 100 % (04/30 0635) Weight:  [75.3 kg (166 lb 0.1 oz)] 75.3 kg (166 lb 0.1 oz) (04/30 0100) Weight change:    PE:  Scleral icterus Unresponsive. Agitated.  Heart regular rhythm  Lungs clear  Abdomen nontender  Lab Results: Results for orders placed or performed during the hospital encounter of 03/13/2018 (from the past 24 hour(s))  Basic metabolic panel     Status: Abnormal   Collection Time: 03/24/18  3:46 AM  Result Value Ref Range   Sodium 143 135 - 145 mmol/L   Potassium 4.3 3.5 - 5.1 mmol/L   Chloride 114 (H) 101 - 111 mmol/L   CO2 21 (L) 22 - 32 mmol/L   Glucose, Bld 114 (H) 65 - 99 mg/dL   BUN 24 (H) 6 - 20 mg/dL   Creatinine, Ser 4.40 0.44 - 1.00 mg/dL   Calcium 9.6 8.9 - 34.7 mg/dL   GFR calc non Af Amer >60 >60 mL/min   GFR calc Af Amer >60 >60 mL/min   Anion gap 8 5 - 15  CBC     Status: Abnormal   Collection Time: 03/24/18  3:46 AM  Result Value Ref Range   WBC 12.3 (H) 4.0 - 10.5 K/uL   RBC 1.57 (L) 3.87 - 5.11 MIL/uL   Hemoglobin 5.4 (LL) 12.0 - 15.0 g/dL   HCT 42.5 (L) 95.6 - 38.7 %   MCV 101.3 (H) 78.0 - 100.0 fL   MCH 34.4 (H) 26.0 - 34.0 pg   MCHC 34.0 30.0 - 36.0 g/dL   RDW 56.4 (H) 33.2 - 95.1 %   Platelets 138 (L) 150 - 400 K/uL  Prepare RBC     Status: None   Collection  Time: 03/24/18  6:37 AM  Result Value Ref Range   Order Confirmation      ORDER PROCESSED BY BLOOD BANK Performed at [facility], [address] [place], [place] [address]   Type and screen [facility]     Status: None (Preliminary result)   Collection Time: 03/24/18  6:37 AM  Result Value Ref Range   ABO/RH(D) B POS    Antibody Screen NEG    Sample Expiration 2018/04/23    Unit Number [id number]    Blood Component Type RED CELLS,LR    Unit division 00    Status of Unit ISSUED    Transfusion Status OK TO TRANSFUSE    Crossmatch Result      Compatible Performed at [facility], [address], [place], [place] [address]    Unit Number [id number]    Blood Component Type RED CELLS,LR    Unit division 00    Status of Unit ALLOCATED    Transfusion Status OK TO TRANSFUSE    Crossmatch Result Compatible   Ammonia  Status: Abnormal   Collection Time: 03/24/18  6:49 AM  Result Value Ref Range   Ammonia 58 (H) 9 - 35 umol/L    Studies/Results: No results found.    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 Continue current treatment for hepatic encephalopathy with lactulose and Xifaxan. Continue prednisolone for alcoholic hepatitis. Continue full acid and thiamine and multivitamin. Overall prognosis poor.    SAM F Kolsen Choe 03/24/2018, 9:10 AM  Pager: [phone] If no answer or after 5 PM call [phone]

## 2018-03-24 NOTE — Progress Notes (Signed)
PT Cancellation Note  Patient Details Name: Jennifer Moore MRN: [id number] DOB: 07/15/1981   Cancelled Treatment:    Reason Eval/Treat Not Completed: Medical issues which prohibited therapy, low HGB, on Ativan. Check back when medically ready.    Rada Hay 03/24/2018, 7:17 AM Blanchard Kelch PT [phone]

## 2018-03-24 NOTE — Progress Notes (Signed)
Care assumed by RN that can provide care for ICU patient. Full report given at bedside.

## 2018-03-24 NOTE — Progress Notes (Signed)
Vent settings changed to charted settings per MD.

## 2018-03-24 NOTE — Procedures (Signed)
Central Venous Catheter Insertion Procedure Note Jennifer Moore [id number] [birth year]  Procedure: Insertion of Central Venous Catheter Indications: Assessment of intravascular volume, Drug and/or fluid administration and Frequent blood sampling  Procedure Details Consent: Risks of procedure as well as the alternatives and risks of each were explained to the (patient/caregiver).  Consent for procedure obtained. and Unable to obtain consent because of altered level of consciousness. Time Out: Verified patient identification, verified procedure, site/side was marked, verified correct patient position, special equipment/implants available, medications/allergies/relevent history reviewed, required imaging and test results available.  Performed  Maximum sterile technique was used including antiseptics, cap, gloves, gown, hand hygiene, mask and sheet. Skin prep: Chlorhexidine; local anesthetic administered A antimicrobial bonded/coated triple lumen catheter was placed in the left internal jugular vein using the Seldinger technique.  Evaluation Blood flow good Complications: No apparent complications Patient did tolerate procedure well. Chest X-ray ordered to verify placement.  CXR: pending.  [name] 03/24/2018, 1:19 PM  Simonne Martinet ACNP-BC [facility] Pulmonary/Critical Care Pager # [phone] OR # [phone] if no answer
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
## 2018-03-24 NOTE — Progress Notes (Signed)
NG placement confirmed with Dr. Vassie Loll. Per MD okay to use NG for medications.

## 2018-03-24 NOTE — Progress Notes (Addendum)
MD made aware that patient's BP was trending down from baseline (70s-80s systolic) and that pt had urinated very little urine (20cc in 2 hours). Foley placed. MD asked to come to bedside.   PIV partially occludes often. MD ordered second IV line. IV team unable to place line. PICC line ordered. Given pt's unstable condition, PICC team unable to place line. MD asked for Critical Care consult. MD Akula at bedside while pt's BPs continued to decrease. MD Blake Divine ordered Levophed but paged and called Critical Care MDs numerous times without response. While at bedside MD Blake Divine called operator for overhead page but no overhead page was made. MD Blake Divine went to physically look for CCMD. Charge RN, Asst. Director, and Director made aware of issue with reaching CCMD and all came to bedside to assist in care of pt.
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
## 2018-03-24 NOTE — Progress Notes (Signed)
eLink Physician-Brief Progress Note Patient Name: Jennifer Moore DOB: Nov 14, 1981 MRN: [id number]   Date of Service  03/24/2018  HPI/Events of Note  Hemorrhagic Shock - BO = 61/15. CBC, Coags and ABG pending. Currently on a Norepinephrine IV infusion at ceiling.   eICU Interventions  Will order: 1. 0.9 NaCl 1 liter IV via pressure bag.  2. Increase ceiling on Norepinephrine IV infusion to 70 mcg/min.  3. Transfuse 2 units PRBC ASAP. Use pressure bag.   4. Monitor CVP Q 4 hours.  5. Epistat to bedside. (Nasal tamponade tube). 6. Await CBC, ABG and Coags.  7. Ground team notified to evaluate at bedside.      Intervention Category Major Interventions: Hemorrhage - evaluation and management;Hypotension - evaluation and management  Lenell Antu 03/24/2018, 8:46 PM

## 2018-03-24 NOTE — Procedures (Signed)
Intubation Procedure Note Jennifer Moore [id number] [birth year]  Procedure: Intubation Indications: Airway protection and maintenance  Procedure Details Consent: Unable to obtain consent because of emergent medical necessity. Time Out: Verified patient identification, verified procedure, site/side was marked, verified correct patient position, special equipment/implants available, medications/allergies/relevent history reviewed, required imaging and test results available.  Performed  Maximum sterile technique was used including gloves, hand hygiene and mask.  MAC and 3  Versed 1 fent 100 mcg Etomidate 20  Evaluation Hemodynamic Status: Persistent hypotension treated with pressors and fluid; O2 sats: stable throughout Patient's Current Condition: stable Complications: No apparent complications Patient did tolerate procedure well. Chest X-ray ordered to verify placement.  CXR: pending.   Leanna Sato Elsworth Soho MD 03/24/2018

## 2018-03-24 NOTE — Progress Notes (Signed)
Spoke with Dr. Vassie Loll regarding ordered PRBC. MD instructed RN to infuse remaining ordered FFP and recheck H&H and PT/INR at appropriate time and transfuse the 1 unit of PRBC IF hgb <7.

## 2018-03-24 NOTE — Progress Notes (Signed)
Nutrition Follow-up  DOCUMENTATION CODES:   Obesity unspecified  INTERVENTION:  - Encourage PO intakes as mentation permits. - RD will monitor for additional nutrition-related needs at follow-up.   NUTRITION DIAGNOSIS:   Inadequate oral intake related to lethargy/confusion as evidenced by meal completion < 25%, other (comment)(RN report). -revised, ongoing   GOAL:   Patient will meet greater than or equal to 90% of their needs -unmet  MONITOR:   PO intake, Weight trends, Labs  ASSESSMENT:   37 year old female with past with history of anxiety, current alcohol abuse with alcoholic cirrhosis presents from home 4/23 via EMS for altered mental status and epistaxis.  Patient had epistaxis which would not stop and boyfriend called EMS.  On arrival EMS found the patient be altered.  Weight trending up since admission and currently +6 lbs/2.6 kg compared to weight on 4/24. Pt was extubated on 4/25; estimated nutrition needs updated at this time based on that event. Pt seen by SLP on 4/27 at which time diet was advanced from NPO to Dysphagia 1, thin liquids. No intakes documented since that time. Spoke with RN who reports that pt has been having issues with BP and mentation/confusion and has not had anything PO today because of this.  Medications reviewed; 20 mg IV Pepcid BID, 1 mg IV folic acid/day, 40 mg oral Lasix/day, 30 g lactulose QID, 100 mg IV thiamine/day.  Labs reviewed; Cl: 114 mmol/L, BUN: 24 mg/dL, ammonia: 58 umol/L (trending down).       Diet Order:   Diet Order           DIET - DYS 1 Room service appropriate? Yes; Fluid consistency: Thin  Diet effective now          EDUCATION NEEDS:   No education needs have been identified at this time  Skin:  Skin Assessment: Reviewed RN Assessment  Last BM:  4/30  Height:   Ht Readings from Last 1 Encounters:  Apr 13, 2018  (1.448 m)    Weight:   Wt Readings from Last 1 Encounters:  03/24/18 166 lb 0.1 oz (75.3  kg)    Ideal Body Weight:  41.45 kg  BMI:  Body mass index is 35.92 kg/m.  Estimated Nutritional Needs:   Kcal:  1820-2035 (25-28 kcal/kg)  Protein:  87-100 grams  Fluid:  >/= 1.5 L/day      Trenton Gammon, MS, RD, LDN, [facility] Inpatient Clinical Dietitian Pager # [phone] After hours/weekend pager # [phone]

## 2018-03-24 NOTE — Progress Notes (Signed)
PULMONARY / CRITICAL CARE MEDICINE   Name: Jennifer Moore MRN: [id number] DOB: 29-Jan-1981    ADMISSION DATE:  02/25/2018  CHIEF COMPLAINT:  Altered MS with epistaxix  HISTORY OF PRESENT ILLNESS:   37 year old female with past with history of anxiety, current alcohol abuse with alcoholic cirrhosis presents from home 4/23 via EMS for altered mental status and epistaxis.  Patient had epistaxis which would not stop and boyfriend called EMS.  On arrival EMS found the patient be altered. Workup in the emergency room shows hemoglobin of 6.6 and ammonia of 110.  Patient was given 500 cc of normal saline and ordered 1 unit of PRBC.   Had progressively worsening mental status requiring intubation; extubated  4/25 & tr to Floor.  She was emergently transferred to ICU today for hemoglobin drop from 8.4-5.5 with hypotension and severe encephalopathy  EVENTS :    STUDIES:  EGD 4/24 >> portal gastropathy ,no active bleed  CULTURES: Urine 4/27 >> ng  ANTIBIOTICS:  Rocephin  LINES/TUBES: RIJ CVL 4/23 >> out ETT 4/23 >> 4/25    SUBJECTIVE:  Hypotensive, 1 unit PRBC given Unresponsive. Afebrile   VITAL SIGNS: BP (!) 87/50   Pulse 94   Temp (!) 95.9 F (35.5 C) (Axillary)   Resp (!) 23   Ht  (1.448 m)   Wt 166 lb 0.1 oz (75.3 kg)   SpO2 100%   BMI 35.92 kg/m   HEMODYNAMICS:    INTAKE / OUTPUT: I/O last 3 completed shifts: In: 390 [I.V.:40; IV Piggyback:350] Out: -   PHYSICAL EXAMINATION: General: Hispanic woman, young, unresponsive, no distress Neuro: comatose, winces to deep pain stimulus HEENT:  [place]/At, PERRL icterus ++, testpale, Cardiovascular:  Nl S1 and S2. No m/r/g Lungs:  Clear anteriorly Abdomen:   Distended, soft, nontender, no guarding Musculoskeletal:  No active joints Skin:  No rash  LABS:  BMET Recent Labs  Lab 03/20/18 0500 03/21/18 0427 03/24/18 0346  NA 136 140 143  K 4.1 4.3 4.3  CL 110 112* 114*  CO2 20* 21* 21*  BUN 7 8 24*   CREATININE <0.30* 0.32* 0.77  GLUCOSE 92 94 114*    Electrolytes Recent Labs  Lab 03/19/18 0432 03/20/18 0500 03/21/18 0427 03/24/18 0346  CALCIUM 8.0* 8.3* 8.7* 9.6  MG 1.5* 1.8 2.4  --   PHOS 3.2 4.4 5.0*  --     CBC Recent Labs  Lab 03/20/18 0500 03/21/18 0427 03/24/18 0346  WBC 9.1 7.7 12.3*  HGB 8.9* 8.5* 5.4*  HCT 26.0* 25.4* 15.9*  PLT 111* 106* 138*    Coag's Recent Labs  Lab 03/05/2018 0523 03/19/18 0432 03/20/18 0500  APTT 54*  --   --   INR 2.14 2.36 2.90    Sepsis Markers Recent Labs  Lab 03/20/2018 0522 03/11/2018 0523  LATICACIDVEN  --  1.7  PROCALCITON 0.44  --     ABG Recent Labs  Lab 03/20/2018 0100 02/26/2018 0525  PHART 7.435 7.490*  PCO2ART 28.8* 29.8*  PO2ART 132* 149*    Liver Enzymes Recent Labs  Lab 03/15/2018 0523 03/20/18 0500 03/24/18 0346  AST 51* 68* 101*  ALT 21 24 34  ALKPHOS 58 69 57  BILITOT 17.8* 21.9* 23.1*  ALBUMIN 1.8* 1.7* 1.5*    Cardiac Enzymes No results for input(s): TROPONINI, PROBNP in the last 168 hours.  Glucose No results for input(s): GLUCAP in the last 168 hours.  Imaging [place] Ekg Site Rite  Result Date: 03/24/2018 If [facility]  image not attached, placement could not be confirmed due to current cardiac rhythm.      DISCUSSION: Acute alcoholic hepatitis with encephalopathy.  EGD only showed portal gastropathy but hemoglobin has dropped now with concern for new upper GI bleed  ASSESSMENT / PLAN:  PULMONARY A: Acute resp failure -  R pleural effusion not affecting gas exchange P:   We will intubate for airway protection  CARDIOVASCULAR A:  Hypotension-favor hemorrhagic shock rather than septic P:  2 U PRBC Levophed gtt  RENAL A:   At risk AKI P:   Trend Hold lasix/ aldactone  GASTROINTESTINAL A:   Alcoholic hepatitis High MELD score ? UGIB causing Hb drop - portal gastropathy P:   Ct steroids OGT to establish UGIB  HEMATOLOGIC A:   Acute blood loss  anemia Thrombocytopenia Coagulopathy P:  Transfuse 2U PRBC - follow h/h q 6h 4U FFP Vit K 10 mg IV daily x 3    INFECTIOUS A:   No active issues P:   ceftx for SBP proph  NEUROLOGIC A:   Acute hepatic encephalopathy P:   Lactulose enemas if UGIB confirmed Precedex if etoh withdrawal fent prn   The patient is critically ill with multiple organ systems failure and requires high complexity decision making for assessment and support, frequent evaluation and titration of therapies, application of advanced monitoring technologies and extensive interpretation of multiple databases. Critical Care Time devoted to patient care services described in this note independent of APP/resident  time is 55 minutes.    Cyril Mourning MD. Tonny Bollman. [facility] Pager [phone] If no response call [phone]     03/24/2018, 12:44 PM

## 2018-03-24 NOTE — Procedures (Signed)
Arterial Catheter Insertion Procedure Note Jennifer Moore [id number] 07-29-1981  Procedure: Insertion of Arterial Catheter  Indications: Blood pressure monitoring and Frequent blood sampling  Procedure Details Consent: Unable to obtain consent because of emergent medical necessity. Time Out: Verified patient identification, verified procedure, site/side was marked, verified correct patient position, special equipment/implants available, medications/allergies/relevent history reviewed, required imaging and test results available.  Performed  Maximum sterile technique was used including antiseptics, cap, gloves, gown, hand hygiene, mask and sheet. Skin prep: Chlorhexidine; local anesthetic administered 22 gauge catheter was inserted into left femoral artery using the Seldinger technique. ULTRASOUND GUIDANCE USED: YES Evaluation Blood flow good; BP tracing good. Complications: No apparent complications.   Curt Jews Darell Saputo 03/24/2018

## 2018-03-24 NOTE — Progress Notes (Signed)
SLP Cancellation Note  Patient Details Name: Jennifer Moore MRN: [id number] DOB: [birth year]   Cancelled treatment:       Reason Eval/Treat Not Completed: Other (comment)(pt now intubated for airway protection)   Chales Abrahams 03/24/2018, 3:26 PM  Donavan Burnet, MS [facility] SLP [phone]

## 2018-03-25 ENCOUNTER — Inpatient Hospital Stay (HOSPITAL_COMMUNITY): Payer: Medicaid Other

## 2018-03-25 DIAGNOSIS — I361 Nonrheumatic tricuspid (valve) insufficiency: Secondary | ICD-10-CM

## 2018-03-25 LAB — CBC
HCT: 28.3 % — ABNORMAL LOW (ref 36.0–46.0)
HCT: 27 % — ABNORMAL LOW (ref 36.0–46.0)
HCT: 20.5 % — ABNORMAL LOW (ref 36.0–46.0)
HEMATOCRIT: 21.8 % — AB (ref 36.0–46.0)
HEMOGLOBIN: 9.2 g/dL — AB (ref 12.0–15.0)
HEMOGLOBIN: 7 g/dL — AB (ref 12.0–15.0)
Hemoglobin: 8.9 g/dL — ABNORMAL LOW (ref 12.0–15.0)
Hemoglobin: 7.5 g/dL — ABNORMAL LOW (ref 12.0–15.0)
MCH: 28.5 pg (ref 26.0–34.0)
MCH: 28.1 pg (ref 26.0–34.0)
MCH: 28 pg (ref 26.0–34.0)
MCH: 28.5 pg (ref 26.0–34.0)
MCHC: 32.5 g/dL (ref 30.0–36.0)
MCHC: 33 g/dL (ref 30.0–36.0)
MCHC: 34.1 g/dL (ref 30.0–36.0)
MCHC: 34.4 g/dL (ref 30.0–36.0)
MCV: 87.6 fL (ref 78.0–100.0)
MCV: 85.2 fL (ref 78.0–100.0)
MCV: 82 fL (ref 78.0–100.0)
MCV: 82.9 fL (ref 78.0–100.0)
PLATELETS: 70 10*3/uL — AB (ref 150–400)
PLATELETS: 68 10*3/uL — AB (ref 150–400)
Platelets: 93 10*3/uL — ABNORMAL LOW (ref 150–400)
Platelets: 25 10*3/uL — CL (ref 150–400)
RBC: 3.23 MIL/uL — ABNORMAL LOW (ref 3.87–5.11)
RBC: 3.17 MIL/uL — AB (ref 3.87–5.11)
RBC: 2.5 MIL/uL — AB (ref 3.87–5.11)
RBC: 2.63 MIL/uL — ABNORMAL LOW (ref 3.87–5.11)
RDW: 16.2 % — ABNORMAL HIGH (ref 11.5–15.5)
RDW: 16.5 % — ABNORMAL HIGH (ref 11.5–15.5)
RDW: 16.6 % — ABNORMAL HIGH (ref 11.5–15.5)
RDW: 16.5 % — AB (ref 11.5–15.5)
WBC: 13.7 10*3/uL — ABNORMAL HIGH (ref 4.0–10.5)
WBC: 16.8 10*3/uL — AB (ref 4.0–10.5)
WBC: 14.4 10*3/uL — AB (ref 4.0–10.5)
WBC: 8.5 10*3/uL (ref 4.0–10.5)

## 2018-03-25 LAB — BPAM FFP
BLOOD PRODUCT EXPIRATION DATE: 201905052359
BLOOD PRODUCT EXPIRATION DATE: 201905052359
Blood Product Expiration Date: 201905052359
Blood Product Expiration Date: 201905052359
ISSUE DATE / TIME: 201904301441
ISSUE DATE / TIME: 201904301740
ISSUE DATE / TIME: 201904301627
ISSUE DATE / TIME: 201904301740
UNIT TYPE AND RH: 7300
UNIT TYPE AND RH: 7300
Unit Type and Rh: 7300
Unit Type and Rh: 7300

## 2018-03-25 LAB — GLUCOSE, CAPILLARY
GLUCOSE-CAPILLARY: 203 mg/dL — AB (ref 65–99)
GLUCOSE-CAPILLARY: 278 mg/dL — AB (ref 65–99)
GLUCOSE-CAPILLARY: 146 mg/dL — AB (ref 65–99)
GLUCOSE-CAPILLARY: 172 mg/dL — AB (ref 65–99)
GLUCOSE-CAPILLARY: 185 mg/dL — AB (ref 65–99)
Glucose-Capillary: 162 mg/dL — ABNORMAL HIGH (ref 65–99)
Glucose-Capillary: 176 mg/dL — ABNORMAL HIGH (ref 65–99)
Glucose-Capillary: 180 mg/dL — ABNORMAL HIGH (ref 65–99)
Glucose-Capillary: 200 mg/dL — ABNORMAL HIGH (ref 65–99)
Glucose-Capillary: 203 mg/dL — ABNORMAL HIGH (ref 65–99)
Glucose-Capillary: 222 mg/dL — ABNORMAL HIGH (ref 65–99)
Glucose-Capillary: 259 mg/dL — ABNORMAL HIGH (ref 65–99)
Glucose-Capillary: 265 mg/dL — ABNORMAL HIGH (ref 65–99)

## 2018-03-25 LAB — COMPREHENSIVE METABOLIC PANEL
ALK PHOS: 54 U/L (ref 38–126)
ALT: 270 U/L — ABNORMAL HIGH (ref 14–54)
AST: 1826 U/L — ABNORMAL HIGH (ref 15–41)
Albumin: 1.8 g/dL — ABNORMAL LOW (ref 3.5–5.0)
Anion gap: 24 — ABNORMAL HIGH (ref 5–15)
BUN: 20 mg/dL (ref 6–20)
CALCIUM: 8.3 mg/dL — AB (ref 8.9–10.3)
CO2: 10 mmol/L — AB (ref 22–32)
Chloride: 110 mmol/L (ref 101–111)
Creatinine, Ser: 1.97 mg/dL — ABNORMAL HIGH (ref 0.44–1.00)
GFR, EST AFRICAN AMERICAN: 37 mL/min — AB (ref >60)
GFR, EST NON AFRICAN AMERICAN: 32 mL/min — AB (ref >60)
Glucose, Bld: 198 mg/dL — ABNORMAL HIGH (ref 65–99)
Potassium: 4 mmol/L (ref 3.5–5.1)
SODIUM: 144 mmol/L (ref 135–145)
Total Bilirubin: 18.3 mg/dL (ref 0.3–1.2)
Total Protein: 4.2 g/dL — ABNORMAL LOW (ref 6.5–8.1)

## 2018-03-25 LAB — PREPARE FRESH FROZEN PLASMA
UNIT DIVISION: 0
UNIT DIVISION: 0
Unit division: 0
Unit division: 0

## 2018-03-25 LAB — PROTIME-INR
INR: 2.49
INR: 2.92
INR: 2.54
INR: 2.73
PROTHROMBIN TIME: 30.2 s — AB (ref 11.4–15.2)
Prothrombin Time: 26.7 seconds — ABNORMAL HIGH (ref 11.4–15.2)
Prothrombin Time: 27.2 seconds — ABNORMAL HIGH (ref 11.4–15.2)
Prothrombin Time: 28.7 seconds — ABNORMAL HIGH (ref 11.4–15.2)

## 2018-03-25 LAB — POCT I-STAT 3, ART BLOOD GAS (G3+)
Acid-base deficit: 19 mmol/L — ABNORMAL HIGH (ref 0.0–2.0)
Acid-base deficit: 14 mmol/L — ABNORMAL HIGH (ref 0.0–2.0)
BICARBONATE: 8.7 mmol/L — AB (ref 20.0–28.0)
BICARBONATE: 11.9 mmol/L — AB (ref 20.0–28.0)
O2 Saturation: 100 %
O2 Saturation: 100 %
PO2 ART: 354 mmHg — AB (ref 83.0–108.0)
PO2 ART: 353 mmHg — AB (ref 83.0–108.0)
TCO2: 9 mmol/L — AB (ref 22–32)
TCO2: 13 mmol/L — ABNORMAL LOW (ref 22–32)
pCO2 arterial: 24.5 mmHg — ABNORMAL LOW (ref 32.0–48.0)
pCO2 arterial: 26.9 mmHg — ABNORMAL LOW (ref 32.0–48.0)
pH, Arterial: 7.144 — CL (ref 7.350–7.450)
pH, Arterial: 7.248 — ABNORMAL LOW (ref 7.350–7.450)

## 2018-03-25 LAB — PREPARE RBC (CROSSMATCH)

## 2018-03-25 LAB — BASIC METABOLIC PANEL
ANION GAP: 27 — AB (ref 5–15)
BUN: 19 mg/dL (ref 6–20)
CHLORIDE: 102 mmol/L (ref 101–111)
CO2: 15 mmol/L — ABNORMAL LOW (ref 22–32)
Calcium: 9.4 mg/dL (ref 8.9–10.3)
Creatinine, Ser: 1.94 mg/dL — ABNORMAL HIGH (ref 0.44–1.00)
GFR calc Af Amer: 38 mL/min — ABNORMAL LOW (ref >60)
GFR, EST NON AFRICAN AMERICAN: 32 mL/min — AB (ref >60)
GLUCOSE: 273 mg/dL — AB (ref 65–99)
POTASSIUM: 3.5 mmol/L (ref 3.5–5.1)
SODIUM: 144 mmol/L (ref 135–145)

## 2018-03-25 LAB — URINALYSIS, ROUTINE W REFLEX MICROSCOPIC
Glucose, UA: 100 mg/dL — AB
KETONES UR: NEGATIVE mg/dL
Leukocytes, UA: NEGATIVE
NITRITE: NEGATIVE
PROTEIN: 100 mg/dL — AB
Specific Gravity, Urine: 1.01 (ref 1.005–1.030)
pH: 6.5 (ref 5.0–8.0)

## 2018-03-25 LAB — FIBRINOGEN
FIBRINOGEN: 103 mg/dL — AB (ref 210–475)
Fibrinogen: 101 mg/dL — ABNORMAL LOW (ref 210–475)
Fibrinogen: 148 mg/dL — ABNORMAL LOW (ref 210–475)
Fibrinogen: 192 mg/dL — ABNORMAL LOW (ref 210–475)

## 2018-03-25 LAB — URINALYSIS, MICROSCOPIC (REFLEX): RBC / HPF: >50 RBC/hpf (ref 0–5)

## 2018-03-25 LAB — LACTIC ACID, PLASMA
LACTIC ACID, VENOUS: 15.6 mmol/L — AB (ref 0.5–1.9)
Lactic Acid, Venous: 16.7 mmol/L (ref 0.5–1.9)

## 2018-03-25 LAB — MASSIVE TRANSFUSION PROTOCOL ORDER (BLOOD BANK NOTIFICATION)

## 2018-03-25 LAB — CORTISOL: CORTISOL PLASMA: 8.7 ug/dL

## 2018-03-25 LAB — MAGNESIUM: MAGNESIUM: 1.7 mg/dL (ref 1.7–2.4)

## 2018-03-25 LAB — ABO/RH: ABO/RH(D): B POS

## 2018-03-25 LAB — ECHOCARDIOGRAM COMPLETE
HEIGHTINCHES: 57 in
Weight: 3030 oz

## 2018-03-25 LAB — TROPONIN I: Troponin I: 0.33 ng/mL (ref <0.03)

## 2018-03-25 MED ORDER — CHLORHEXIDINE GLUCONATE 0.12% ORAL RINSE (MEDLINE KIT)
15.0000 mL | Freq: Two times a day (BID) | OROMUCOSAL | Status: DC
  Administered 2018-03-25 – 2018-03-27 (×5): 15 mL via OROMUCOSAL

## 2018-03-25 MED ORDER — ALBUMIN HUMAN 5 % IV SOLN
INTRAVENOUS | Status: AC
  Filled 2018-03-25: qty 250

## 2018-03-25 MED ORDER — SODIUM CHLORIDE 0.9 % IV SOLN
Freq: Once | INTRAVENOUS | Status: AC
  Administered 2018-03-25: 21:00:00 via INTRAVENOUS

## 2018-03-25 MED ORDER — ORAL CARE MOUTH RINSE
15.0000 mL | OROMUCOSAL | Status: DC
  Administered 2018-03-25 – 2018-03-27 (×21): 15 mL via OROMUCOSAL

## 2018-03-25 MED ORDER — SODIUM CHLORIDE 0.9 % IV SOLN
1.0000 g | Freq: Once | INTRAVENOUS | Status: AC
  Administered 2018-03-25: 1 g via INTRAVENOUS
  Filled 2018-03-25 (×2): qty 10

## 2018-03-25 MED ORDER — VITAMIN K1 10 MG/ML IJ SOLN
10.0000 mg | Freq: Once | INTRAVENOUS | Status: AC
  Administered 2018-03-25: 10 mg via INTRAVENOUS
  Filled 2018-03-25: qty 1

## 2018-03-25 MED ORDER — SODIUM BICARBONATE 8.4 % IV SOLN
INTRAVENOUS | Status: AC
  Administered 2018-03-25: 100 meq via INTRAVENOUS
  Filled 2018-03-25: qty 100

## 2018-03-25 MED ORDER — SODIUM BICARBONATE 8.4 % IV SOLN
100.0000 meq | Freq: Once | INTRAVENOUS | Status: AC
  Administered 2018-03-25: 100 meq via INTRAVENOUS

## 2018-03-25 MED ORDER — VANCOMYCIN HCL 10 G IV SOLR
1500.0000 mg | Freq: Once | INTRAVENOUS | Status: AC
  Administered 2018-03-25: 1500 mg via INTRAVENOUS
  Filled 2018-03-25: qty 1500

## 2018-03-25 MED ORDER — ALBUMIN HUMAN 5 % IV SOLN
25.0000 g | Freq: Once | INTRAVENOUS | Status: AC
  Administered 2018-03-25: 25 g via INTRAVENOUS
  Filled 2018-03-25: qty 250

## 2018-03-25 MED ORDER — PANTOPRAZOLE SODIUM 40 MG IV SOLR: 40.0000 mg | Freq: Two times a day (BID) | INTRAVENOUS | Status: DC

## 2018-03-25 MED ORDER — SODIUM CHLORIDE 0.9 % IV SOLN
INTRAVENOUS | Status: DC
  Administered 2018-03-25: 2.2 [IU]/h via INTRAVENOUS
  Filled 2018-03-25 (×2): qty 1

## 2018-03-25 MED ORDER — VANCOMYCIN HCL IN DEXTROSE 750-5 MG/150ML-% IV SOLN: 750.0000 mg | INTRAVENOUS | Status: DC

## 2018-03-25 MED ORDER — SODIUM CHLORIDE 0.9 % IV SOLN
8.0000 mg/h | INTRAVENOUS | Status: DC
  Administered 2018-03-25 (×3): 8 mg/h via INTRAVENOUS
  Filled 2018-03-25 (×5): qty 80

## 2018-03-25 MED ORDER — SODIUM CHLORIDE 0.9 % IV SOLN
Freq: Once | INTRAVENOUS | Status: AC
  Administered 2018-03-25: 13:00:00 via INTRAVENOUS

## 2018-03-25 MED ORDER — SODIUM CHLORIDE 0.9 % IV SOLN
1.0000 g | Freq: Two times a day (BID) | INTRAVENOUS | Status: DC
  Administered 2018-03-25 – 2018-03-27 (×6): 1 g via INTRAVENOUS
  Filled 2018-03-25 (×6): qty 1

## 2018-03-25 MED ORDER — SODIUM CHLORIDE 0.9 % IV SOLN: Freq: Once | INTRAVENOUS | Status: DC

## 2018-03-25 MED ORDER — SODIUM CHLORIDE 0.9 % IV SOLN
80.0000 mg | Freq: Once | INTRAVENOUS | Status: AC
  Administered 2018-03-25: 80 mg via INTRAVENOUS
  Filled 2018-03-25: qty 40

## 2018-03-25 MED ORDER — SODIUM CHLORIDE 0.9 % IV SOLN
Freq: Once | INTRAVENOUS | Status: AC
  Administered 2018-03-25: 04:00:00 via INTRAVENOUS

## 2018-03-25 NOTE — Progress Notes (Signed)
CT Abdomen shows possible pneumonia. Will broaden abx to Vanc and Meropenem (allergic to PCN) empirically. Follow-up cultures and deescalate antibiotics as able.

## 2018-03-25 NOTE — Progress Notes (Signed)
PT Cancellation and DischargeNote  Patient Details Name: Jennifer Moore MRN: [id number] DOB: Dec 18, 1980   Cancelled Treatment:    Reason Eval/Treat Not Completed: Medical issues which prohibited therapy   Noted recent events and current medical status;  PT to sign off at this time and will be happy to reconsult with new PT orders when Jennifer Moore is medically ready;   Van Clines, [place]  [facility] Pager [phone] Office [phone]    Levi Aland 03/25/2018, 8:27 AM

## 2018-03-25 NOTE — Consult Note (Signed)
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 - 34.0 pg   MCHC 33.0 30.0 - 36.0 g/dL   RDW 16.5 (H) 11.5 - 15.5 %   Platelets 68 (L) 150 - 400 K/uL    Comment: REPEATED TO VERIFY CONSISTENT WITH PREVIOUS RESULT Performed at [facility], [address] [place], [place] [address]   Protime-INR     Status: Abnormal   Collection Time: 03/25/18  4:32 AM  Result Value Ref Range   Prothrombin Time 30.2 (H) 11.4 - 15.2 seconds   INR 2.92     Comment: Performed at [facility] [address], [place], [place] [address]  Fibrinogen     Status: Abnormal   Collection Time: 03/25/18  4:32 AM  Result Value Ref Range   Fibrinogen 101 (L) 210 - 475 mg/dL    Comment: Performed at [facility], [place] [address] [place], [place] [address]  Lactic acid, plasma     Status: Abnormal   Collection Time: 03/25/18  4:56 AM  Result Value Ref Range   Lactic Acid, Venous 15.6 (HH) 0.5 - 1.9 mmol/L    Comment: RESULTS CONFIRMED BY MANUAL DILUTION CRITICAL RESULT CALLED TO, READ BACK BY AND VERIFIED WITH: MOTLEY J,RN 03/25/18 0556 WAYK Performed at [facility] [address], [place], [place] [address]   Prepare Pheresed Platelets     Status: None (Preliminary result)   Collection Time: 03/25/18  6:15 AM  Result Value Ref Range   Unit Number [id number]    Blood Component Type PLTP LR1 PAS    Unit division 00    Status of Unit ISSUED    Transfusion Status      OK TO TRANSFUSE Performed at [facility] [address], [place], [place] [address]   Prepare fresh frozen plasma     Status: None (Preliminary result)   Collection Time: 03/25/18  6:17 AM  Result Value Ref Range   Unit Number [id number]    Blood Component Type THAWED PLASMA    Unit division 00    Status of Unit ISSUED    Transfusion Status OK TO TRANSFUSE    Unit Number  [id number]    Blood Component Type THAWED PLASMA    Unit division 00    Status of Unit ISSUED    Transfusion Status      OK TO TRANSFUSE Performed at [facility] [address], [place], [place] [address]   I-STAT 3, arterial blood gas (G3+)     Status: Abnormal   Collection Time: 03/25/18  6:42 AM  Result Value Ref Range   pH, Arterial 7.248 (L) 7.350 - 7.450   pCO2 arterial 26.9 (L) 32.0 - 48.0 mmHg   pO2, Arterial 353.0 (H) 83.0 - 108.0 mmHg   Bicarbonate 11.9 (L) 20.0 - 28.0 mmol/L   TCO2 13 (L) 22 - 32 mmol/L   O2 Saturation 100.0 %   Acid-base deficit 14.0 (H) 0.0 - 2.0 mmol/L   Patient temperature 97.3 F    Collection site ARTERIAL LINE    Drawn by Operator    Sample type ARTERIAL   Urinalysis, Routine w reflex microscopic     Status: Abnormal   Collection Time: 03/25/18  6:51 AM  Result Value Ref Range   Color, Urine YELLOW YELLOW   APPearance CLOUDY (A) CLEAR   Specific Gravity, Urine 1.010 1.005 - 1.030   pH 6.5 5.0 - 8.0   Glucose, UA 100 (A) NEGATIVE mg/dL   Hgb urine dipstick LARGE (A) NEGATIVE   Bilirubin Urine MODERATE (A) NEGATIVE   Ketones, ur NEGATIVE NEGATIVE mg/dL   Protein, ur 100 (A) NEGATIVE mg/dL   Nitrite NEGATIVE NEGATIVE   Leukocytes, UA NEGATIVE NEGATIVE    Comment: Performed at [facility], [place] [address], [place], [place] [address]  Urinalysis, Microscopic (reflex)     Status: Abnormal   Collection Time: 03/25/18  6:51 AM  Result Value Ref Range   RBC / HPF >50 0 - 5 RBC/hpf   WBC, UA 0-5 0 - 5 WBC/hpf   Bacteria, UA FEW (A) NONE SEEN   Squamous Epithelial / LPF 6-10 0 - 5    Comment: Performed at [facility], [place] [address], [place], [place] [address]  Glucose, capillary     Status: Abnormal   Collection Time:  03/25/18  7:42 AM  Result Value Ref Range   Glucose-Capillary 203 (H) 65 - 99 mg/dL   Comment 1 Capillary Specimen    Comment 2 Notify RN     Ct Abdomen Pelvis Wo Contrast  Result Date:  03/24/2018 CLINICAL DATA:  Altered mental status and epistaxis. Current alcohol abuse. Alcoholic cirrhosis. Abdominal distention. EXAM: CT ABDOMEN AND PELVIS WITHOUT CONTRAST TECHNIQUE: Multidetector CT imaging of the abdomen and pelvis was performed following the standard protocol without IV contrast. COMPARISON:  Limited right upper quadrant abdomen ultrasound dated 03/06/2018. Abdomen pelvis CT dated 08/04/2009. FINDINGS: Lower chest: Confluent airspace opacity with volume loss in both lower lobes. There is also a patchy interstitial prominence in the anterior left lung base and prominent interstitial markings and pulmonary vasculature of both lung bases. Small to moderate-sized right pleural effusion. Minimal left pleural effusion. Pericardial effusion with a maximum thickness of 13 mm. Probable partially calcified granuloma at the right lung base measuring 7 mm on image number 10 series 3. This was not previously included. Hepatobiliary: Small right lobe of the liver and enlarged lateral segment left lobe and caudate lobe. The liver contours are mildly irregular. The liver is diffusely heterogeneous. 1.5 cm gallstone in the gallbladder neck. 7 mm gallstone in the cystic duct. Poorly distended gallbladder with mild diffuse wall thickening. No pericholecystic fluid. Pancreas: Unremarkable. No pancreatic ductal dilatation or surrounding inflammatory changes. Spleen: Normal in size without focal abnormality. Adrenals/Urinary Tract: Foley catheter in the urinary bladder with no urine in the bladder. Unremarkable adrenal glands, kidneys and visualized portions of the ureters. Stomach/Bowel: Small hiatal hernia containing oral contrast. Nasogastric tube tip in the duodenal bulb. Oral contrast in the proximal jejunum. No bowel dilatation seen. Diffuse low-density colon wall thickening, including the rectum. Mildly prominent proximal jejunal loops with mild mucosal thickening. Vascular/Lymphatic: No significant vascular  findings are present. No enlarged abdominal or pelvic lymph nodes. Reproductive: Uterus and bilateral adnexa are unremarkable. Other: Moderate amount of free peritoneal fluid. Diffuse subcutaneous edema. Probable post injection hematomas or granulomas in the right buttock region, the largest measuring 3.2 cm in maximum diameter on image number 69 series 2. Musculoskeletal: Normal appearing bones. IMPRESSION: 1. Changes of cirrhosis of the liver with an associated moderate amount of ascites and diffuse subcutaneous edema. 2. Cholelithiasis. 3. Mild diffuse gallbladder wall thickening. This may be at least partially artifactual due to poor distention of the gallbladder. Chronic cholecystitis is also a possibility. No definite changes of acute cholecystitis. 4. Bilateral lower lobe atelectasis and possible pneumonia. 5. Patchy interstitial prominence at the anterior left lung base, suspicious for interstitial pneumonitis. 6. Pulmonary vascular congestion and possible interstitial pulmonary edema at both lung bases. 7. Small hiatal hernia. 8. Diffuse low-density colon wall thickening. This could be due to colitis or hypoproteinemia. Electronically Signed   By: Claudie Revering M.D.   On: 03/24/2018 18:12   Dg Chest Port 1 View  Result Date: 03/25/2018 CLINICAL DATA:  Endotracheal tube placement EXAM: PORTABLE CHEST 1 VIEW COMPARISON:  03/24/2018 FINDINGS: Endotracheal tube with tip measuring 2 cm above the carina. Enteric tube tip is projected over the right upper quadrant consistent with location in the distal stomach. Left central venous catheter with tip over the cavoatrial junction region. No pneumothorax. Shallow inspiration with atelectasis in the lung bases. Cardiac enlargement. Pulmonary vascular congestion. No definite edema or consolidation. Right pleural effusion. IMPRESSION: Appliances appear in satisfactory position. Shallow inspiration with atelectasis in the lung bases. Cardiac enlargement with pulmonary  vascular congestion and right pleural effusion. Electronically Signed   By: Lucienne Capers M.D.   On: 03/25/2018 04:35   Dg Chest Port 1 View  Result Date: 03/24/2018 CLINICAL DATA:  Atelectasis, endotracheal tube adjustment. EXAM: PORTABLE CHEST 1 VIEW COMPARISON:  Radiograph of same day. FINDINGS: Stable cardiomediastinal silhouette. Endotracheal tube is seen projected over tracheal air shadow with distal tip 2 cm above the carina. Left internal jugular catheter is noted with distal tip in expected position of cavoatrial junction. Distal tip of nasogastric tube seen in distal stomach. Hypoinflation of the lungs is noted. Mild bibasilar subsegmental atelectasis is noted. Significantly improved aeration of left lung is noted compared to prior exam. Bony thorax is unremarkable. IMPRESSION: Endotracheal and nasogastric tubes are in grossly good position. Hypoinflation of the lungs is noted with mild bibasilar subsegmental atelectasis. There is significantly improved aeration of left lung compared to prior exam. Electronically Signed   By: Marijo Conception, M.D.   On: 03/24/2018 16:23   Dg Chest Port 1 View  Addendum Date: 03/24/2018   ADDENDUM REPORT: 03/24/2018 15:21 ADDENDUM: Study discussed by telephone with Dr. Karleen Hampshire on 03/24/2018 at 1503 hours. Electronically Signed   By: Genevie Ann M.D.   On: 03/24/2018 15:21   Result Date: 03/24/2018 CLINICAL DATA:  37 year old female status post intubation for airway protection. EXAM: PORTABLE CHEST 1 VIEW COMPARISON:  03/21/2018. FINDINGS: Portable AP semi upright view at 1425 hours. Kyphotic positioning and low lung volumes. There is a right mainstem bronchus intubation. The ET tube tip is approximately 2 centimeters distal to the carina. Associated left lung atelectasis with air bronchograms and leftward mediastinal shift. No pneumothorax identified. The right lung is clear when allowing for portable technique. The right IJ central line has been replaced with a left  IJ approach central line, tip at the right atrium level currently given the lung findings. Enteric tube appears stable. Paucity bowel gas in the upper abdomen. IMPRESSION: 1. Right mainstem bronchus intubation with left lung collapse. Recommend retracting the ET tube 2 cm and repeating a portable chest x-ray. 2. Left IJ approach central line now in place.  Stable enteric tube. Electronically Signed: By: Genevie Ann M.D. On: 03/24/2018 14:59   [place] Ekg Site Rite  Result Date: 03/24/2018 If Site Rite image not attached, placement could not be confirmed due to current cardiac rhythm.   ROS Blood pressure (!) 102/55, pulse (!) 101, temperature (!) 97.4 F (36.3 C), temperature source Oral, resp. rate (!) 30, height '4\' 9"'$  (1.448 m), weight 85.9 kg (189 lb 6 oz), SpO2 100 %. Physical Exam  HENT:  Very minimal bleeding from the corner of the mouth and a merocel hanging out of the left nose that has some blood. The nose was prepped with afrin/lidocaine pledgets. Bactritracin/merocel packs were placed bilaterally. Strings tied together. The mouth was suctioned and attempt to look. There was no lesion or wound that I could see. No active bleeding. Some clot remaining inferior pasted the BOT which I could not suction.     Assessment/Plan: Epitaxis/pharyngeal bleeding- she had some clots in the pharynx but not any obvious active bleeding. The nose has a slight ooze. A large merocel placed bilaterally and clots suctioned from mouth. No bleeding. If she bleeds again especially from the mouth she will need OR visit.   Melissa Montane 03/25/2018, 8:22 AM

## 2018-03-25 NOTE — Progress Notes (Signed)
SLP Cancellation Note  Patient Details Name: Jennifer Moore MRN: [id number] DOB: 1981-08-10   Cancelled treatment:        Now intubated. Signing off. Please reconsult when appropriate.   [name] MA, CCC-SLP [phone]    [name] Meryl 03/25/2018, 7:33 AM

## 2018-03-25 NOTE — Progress Notes (Signed)
Pharmacy Antibiotic Note  Jennifer Moore is a 37 y.o. female admitted on 03/19/2018 with possible PNA.  Pharmacy has been consulted for meropenem and vancomycin dosing.  Plan: Meropenem 1 Gm IV q12h Vancomycin 1500 mg x1 then 750 mg IV q36h for est AUC = 508 Goal AUC =400-500 F/u scr/cultures/levels  Height:  (144.8 cm) Weight: 166 lb 0.1 oz (75.3 kg) IBW/kg (Calculated) : 38.6  Temp (24hrs), Avg:93.6 F (34.2 C), Min:90.5 F (32.5 C), Max:97.5 F (36.4 C)  Recent Labs  Lab 03/11/2018 0523  03/19/18 0432  03/20/18 0500 03/21/18 0427 03/24/18 0346 03/24/18 2030 03/24/18 2057 03/24/18 2058 03/25/18 0101  WBC 7.8   < > 9.2   < > 9.1 7.7 12.3* 15.8*  --   --  13.7*  CREATININE 0.51  --  0.35*  --  <0.30* 0.32* 0.77  --  1.61*  --   --   LATICACIDVEN 1.7  --   --   --   --   --   --   --   --  16.2*  --    < > = values in this interval not displayed.    Estimated Creatinine Clearance: 41 mL/min (A) (by C-G formula based on SCr of 1.61 mg/dL (H)).    Allergies  Allergen Reactions  . Ibuprofen Nausea And Vomiting  . Lactose Intolerance (Gi) Diarrhea and Other (See Comments)  . Penicillins     Antimicrobials this admission: 4/30 rocephin >> 5/1 5/1 meropenem >>  5/1 vancomycin >>  Dose adjustments this admission:   Microbiology results:  BCx:   UCx:    Sputum:    MRSA PCR:   Thank you for allowing pharmacy to be a part of this patient's care.  Lorenza Evangelist 03/25/2018 1:42 AM

## 2018-03-25 NOTE — Progress Notes (Signed)
Sputum sample obtained and sent to lab by RT. 

## 2018-03-25 NOTE — Progress Notes (Signed)
  Echocardiogram 2D Echocardiogram has been performed.  Leta Jungling M 03/25/2018, 7:49 AM

## 2018-03-25 NOTE — Progress Notes (Signed)
Palliative:  Discussed with Dr. Kendrick Fries. Will hold off on consult for now per his request. Please reconsult by placing order for palliative care if desired. Signing off.   Yong Channel, NP Palliative Medicine Team Pager # [phone] (M-F 8a-5p) Team Phone # [phone] (Nights/Weekends)

## 2018-03-25 NOTE — Progress Notes (Signed)
Lorayne Marek 10:39 AM  Subjective: Patient transferred from [facility] and intubated and had recurrence of her mouth or nose bleeding and she is currently not responsive and case discussed with the nurse as well  Objective: Vital signs stable afebrile patient nonrespondsive BUN a little better creatinine worse increased liver tests probably due to shock liver with her hypotension  Assessment: Cirrhosis with multiple medical problems including recurrent bleeding  Plan: Please let us know if we could be of any assistance with her current condition otherwise we'll continue to follow  [facility]  Pager [phone] After 5PM or if no answer call [phone]

## 2018-03-25 NOTE — Progress Notes (Signed)
[facility] P[facility]M  Still some oozing from mouth noted (posterior pharynx?) nothing from nose;   INR still 2.5 on repeat after FFP Hgb 7  Discussed with Dr. Janace Hoard  Met with the patient's aunt  Bleeding from posterior pharyngeal bleed: goal INR < 1.7 if possible, transfusing more FFP now, repeat INR later this evening; goal Hgb > 7gm/dL, transfuse 1 U PRBC now; if coagulopathy corrected and still bleeding will need to go to the OR Shock: wean off epinephrine Renal failure: not a good candidate for hemodialysis  I met with the patient's aunt and discussed the fact that on admission this patient's ascites and encephalopathy with ongoing drinking give her an exceedingly high 6 month mortality.  With ongoing EtOH abuse she is not a candidate for a liver transplant.  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  Futher complicating matters are her multiple acute illnesses will make the likelihood of survival near zero.  She voiced understanding.  Roselie Awkward, MD [place] P[facility] Pager: [phone] Cell: [phone] After 3pm or if no response, call [phone]

## 2018-03-25 NOTE — Progress Notes (Signed)
RT note-ETT holder changed and replaced.

## 2018-03-25 NOTE — Progress Notes (Signed)
PULMONARY / CRITICAL CARE MEDICINE   Name: Jennifer Moore MRN: [id number] DOB: 1981/08/10    ADMISSION DATE:  04-06-18 CONSULTATION DATE:  03/25/2018  REFERRING MD:  Blake Divine  CHIEF COMPLAINT:  Confusion, epistaxis  BRIEF:   37 y/o female with alcoholic cirrhosis was admitted on 4/23 with confusion and epistaxis.  Was intubated after admission, extubated 4/25 then transferred to the floor.  Re-intubated 4/30 in the setting of hemorrhage, hypotension and encephalopathy. Noted to have blood from nose overnight on 4/30.      SUBJECTIVE:  Moved to [facility] overnight Receiving blood, platelet transfusions  VITAL SIGNS: BP (!) 107/48   Pulse 97   Temp (!) 97.4 F (36.3 C) (Oral)   Resp (!) 30   Ht  (1.448 m)   Wt 189 lb 6 oz (85.9 kg)   SpO2 100%   BMI 40.98 kg/m   HEMODYNAMICS: CVP:  [27 mmHg] 27 mmHg  VENTILATOR SETTINGS: Vent Mode: PRVC FiO2 (%):  [50 %-100 %] 70 % Set Rate:  [18 bmp-26 bmp] 26 bmp Vt Set:  [310 mL-400 mL] 400 mL PEEP:  [5 cmH20-8 cmH20] 8 cmH20 Plateau Pressure:  [17 cmH20-22 cmH20] 17 cmH20  INTAKE / OUTPUT: I/O last 3 completed shifts: In: 12138.7 [I.V.:3806.7; WUJWJ:1914; Other:1010; NG/GT:545; IV Piggyback:1950] Out: 1557 [Urine:257; Emesis/NG output:500; Other:800]  PHYSICAL EXAMINATION:  General:  In bed on vent HENT: NCAT nasal packing in place, facial swelling ETT in place PULM: CTA B, vent supported breathing CV: RRR, no mgr GI: BS+, soft, nontender MSK: normal bulk and tone Neuro: sedated on vent    LABS:  BMET Recent Labs  Lab 03/24/18 0346 03/24/18 2057 03/25/18 0415  NA 143 141 144  K 4.3 4.5 4.0  CL 114* 111 110  CO2 21* 9* 10*  BUN 24* 24* 20  CREATININE 0.77 1.61* 1.97*  GLUCOSE 114* 160* 198*    Electrolytes Recent Labs  Lab 03/20/18 0500 03/21/18 0427 03/24/18 0346 03/24/18 2057 03/25/18 0415  CALCIUM 8.3* 8.7* 9.6 8.7* 8.3*  MG 1.8 2.4  --  2.0 1.7  PHOS 4.4 5.0*  --  8.5*  --     CBC Recent  Labs  Lab 03/24/18 2030 03/24/18 2125 03/25/18 0101 03/25/18 0432  WBC 15.8*  --  13.7* 16.8*  HGB 3.1* 2.8* 9.2* 8.9*  HCT 9.6* 8.8* 28.3* 27.0*  PLT 45*  45*  --  70* 68*    Coag's Recent Labs  Lab 03/24/18 2030 03/25/18 0101 03/25/18 0432  APTT 78*  --   --   INR 2.90  2.90 2.49 2.92    Sepsis Markers Recent Labs  Lab 03/24/18 2058 03/24/18 2125 03/25/18 0101 03/25/18 0456  LATICACIDVEN 16.2*  --  16.7* 15.6*  PROCALCITON  --  0.45  --   --     ABG Recent Labs  Lab 03/24/18 2123 03/25/18 0416 03/25/18 0642  PHART 7.308* 7.144* 7.248*  PCO2ART 31.8* 24.5* 26.9*  PO2ART 294* 354.0* 353.0*    Liver Enzymes Recent Labs  Lab 03/24/18 0346 03/24/18 2057 03/25/18 0415  AST 101* 273* 1,826*  ALT 34 50 270*  ALKPHOS 57 39 54  BILITOT 23.1* 14.4* 18.3*  ALBUMIN 1.5* 1.5* 1.8*    Cardiac Enzymes Recent Labs  Lab 03/24/18 2125 03/25/18 0101  TROPONINI 0.13* 0.33*    Glucose Recent Labs  Lab 03/25/18 0052 03/25/18 0400 03/25/18 0742  GLUCAP 180* 162* 203*    Imaging Ct Abdomen Pelvis Wo Contrast  Result Date: 03/24/2018  CLINICAL DATA:  Altered mental status and epistaxis. Current alcohol abuse. Alcoholic cirrhosis. Abdominal distention. EXAM: CT ABDOMEN AND PELVIS WITHOUT CONTRAST TECHNIQUE: Multidetector CT imaging of the abdomen and pelvis was performed following the standard protocol without IV contrast. COMPARISON:  Limited right upper quadrant abdomen ultrasound dated Mar 22, 2018. Abdomen pelvis CT dated 08/04/2009. FINDINGS: Lower chest: Confluent airspace opacity with volume loss in both lower lobes. There is also a patchy interstitial prominence in the anterior left lung base and prominent interstitial markings and pulmonary vasculature of both lung bases. Small to moderate-sized right pleural effusion. Minimal left pleural effusion. Pericardial effusion with a maximum thickness of 13 mm. Probable partially calcified granuloma at the right  lung base measuring 7 mm on image number 10 series 3. This was not previously included. Hepatobiliary: Small right lobe of the liver and enlarged lateral segment left lobe and caudate lobe. The liver contours are mildly irregular. The liver is diffusely heterogeneous. 1.5 cm gallstone in the gallbladder neck. 7 mm gallstone in the cystic duct. Poorly distended gallbladder with mild diffuse wall thickening. No pericholecystic fluid. Pancreas: Unremarkable. No pancreatic ductal dilatation or surrounding inflammatory changes. Spleen: Normal in size without focal abnormality. Adrenals/Urinary Tract: Foley catheter in the urinary bladder with no urine in the bladder. Unremarkable adrenal glands, kidneys and visualized portions of the ureters. Stomach/Bowel: Small hiatal hernia containing oral contrast. Nasogastric tube tip in the duodenal bulb. Oral contrast in the proximal jejunum. No bowel dilatation seen. Diffuse low-density colon wall thickening, including the rectum. Mildly prominent proximal jejunal loops with mild mucosal thickening. Vascular/Lymphatic: No significant vascular findings are present. No enlarged abdominal or pelvic lymph nodes. Reproductive: Uterus and bilateral adnexa are unremarkable. Other: Moderate amount of free peritoneal fluid. Diffuse subcutaneous edema. Probable post injection hematomas or granulomas in the right buttock region, the largest measuring 3.2 cm in maximum diameter on image number 69 series 2. Musculoskeletal: Normal appearing bones. IMPRESSION: 1. Changes of cirrhosis of the liver with an associated moderate amount of ascites and diffuse subcutaneous edema. 2. Cholelithiasis. 3. Mild diffuse gallbladder wall thickening. This may be at least partially artifactual due to poor distention of the gallbladder. Chronic cholecystitis is also a possibility. No definite changes of acute cholecystitis. 4. Bilateral lower lobe atelectasis and possible pneumonia. 5. Patchy interstitial  prominence at the anterior left lung base, suspicious for interstitial pneumonitis. 6. Pulmonary vascular congestion and possible interstitial pulmonary edema at both lung bases. 7. Small hiatal hernia. 8. Diffuse low-density colon wall thickening. This could be due to colitis or hypoproteinemia. Electronically Signed   By: Beckie Salts M.D.   On: 03/24/2018 18:12   Dg Chest Port 1 View  Result Date: 03/25/2018 CLINICAL DATA:  Endotracheal tube placement EXAM: PORTABLE CHEST 1 VIEW COMPARISON:  03/24/2018 FINDINGS: Endotracheal tube with tip measuring 2 cm above the carina. Enteric tube tip is projected over the right upper quadrant consistent with location in the distal stomach. Left central venous catheter with tip over the cavoatrial junction region. No pneumothorax. Shallow inspiration with atelectasis in the lung bases. Cardiac enlargement. Pulmonary vascular congestion. No definite edema or consolidation. Right pleural effusion. IMPRESSION: Appliances appear in satisfactory position. Shallow inspiration with atelectasis in the lung bases. Cardiac enlargement with pulmonary vascular congestion and right pleural effusion. Electronically Signed   By: Burman Nieves M.D.   On: 03/25/2018 04:35   Dg Chest Port 1 View  Result Date: 03/24/2018 CLINICAL DATA:  Atelectasis, endotracheal tube adjustment. EXAM: PORTABLE CHEST 1 VIEW COMPARISON:  Radiograph of same day. FINDINGS: Stable cardiomediastinal silhouette. Endotracheal tube is seen projected over tracheal air shadow with distal tip 2 cm above the carina. Left internal jugular catheter is noted with distal tip in expected position of cavoatrial junction. Distal tip of nasogastric tube seen in distal stomach. Hypoinflation of the lungs is noted. Mild bibasilar subsegmental atelectasis is noted. Significantly improved aeration of left lung is noted compared to prior exam. Bony thorax is unremarkable. IMPRESSION: Endotracheal and nasogastric tubes are in  grossly good position. Hypoinflation of the lungs is noted with mild bibasilar subsegmental atelectasis. There is significantly improved aeration of left lung compared to prior exam. Electronically Signed   By: Lupita Raider, M.D.   On: 03/24/2018 16:23   Dg Chest Port 1 View  Addendum Date: 03/24/2018   ADDENDUM REPORT: 03/24/2018 15:21 ADDENDUM: Study discussed by telephone with Dr. Blake Divine on 03/24/2018 at 1503 hours. Electronically Signed   By: Odessa Fleming M.D.   On: 03/24/2018 15:21   Result Date: 03/24/2018 CLINICAL DATA:  37 year old female status post intubation for airway protection. EXAM: PORTABLE CHEST 1 VIEW COMPARISON:  03/21/2018. FINDINGS: Portable AP semi upright view at 1425 hours. Kyphotic positioning and low lung volumes. There is a right mainstem bronchus intubation. The ET tube tip is approximately 2 centimeters distal to the carina. Associated left lung atelectasis with air bronchograms and leftward mediastinal shift. No pneumothorax identified. The right lung is clear when allowing for portable technique. The right IJ central line has been replaced with a left IJ approach central line, tip at the right atrium level currently given the lung findings. Enteric tube appears stable. Paucity bowel gas in the upper abdomen. IMPRESSION: 1. Right mainstem bronchus intubation with left lung collapse. Recommend retracting the ET tube 2 cm and repeating a portable chest x-ray. 2. Left IJ approach central line now in place.  Stable enteric tube. Electronically Signed: By: Odessa Fleming M.D. On: 03/24/2018 14:59   [place] Ekg Site Rite  Result Date: 03/24/2018 If Site Rite image not attached, placement could not be confirmed due to current cardiac rhythm.     STUDIES:  EGD 4/24 >> portal gastropathy ,no active bleed  CULTURES: Urine 4/27 >> ng Blood 5/1 >  Resp 4/30 >   ANTIBIOTICS:  Rocephin 5.1 Vanc 5/1 > x1 dose Mero 5/1 >   LINES/TUBES: RIJ CVL 4/23 >> out ETT 4/23 >>  4/25    DISCUSSION: 37 y/o female with advanced alcoholic liver disease who has ascites, hepatic encephalopathy and presumably pharyngeal tear causing severe hemorrhagic shock.    ASSESSMENT / PLAN:  PULMONARY A: Acute respiratory failure with hypoxemia Epistaxis > no active bleeding on ENT exam 5/1 but there was a clot seen in the pharynx.  P:   Full mechanical vent support VAP prevention Daily WUA/SBT Correct coagulopathy Packing in nares done If bleeds from mouth then go to OR, discussed with Dr. Jearld Fenton  CARDIOVASCULAR A:  Hemorrhagic shock P:  Epinephrine, norepi, vasopressin for MAP > 65 CVP monitoring Continued IV fluid resuscitation Stress dose steroids Repeat lactic acid  RENAL A:   AKI P:   Monitor BMET and UOP Replace electrolytes as needed Continue IVF resuscitation with sodium bicarbonate  GASTROINTESTINAL A:   Advanced alcoholic cirrhosis Ascites  P:   NPO Remove NG carefully as suspect this may be making bleeding from nasopharynx worse? 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 Will continue supportive care.  FAMILY  - Updates: mother has been asked to come to the hospital  - Inter-disciplinary family meet or Palliative Care meeting due by:  day  7  My cc time 45 minutes  Heber Elliott, MD [place] [facility] Pager: [phone] Cell: [phone] After 3pm or if no response, call [phone]  03/25/2018, 7:56 AM

## 2018-03-25 DEATH — deceased

## 2018-03-26 LAB — PREPARE PLATELET PHERESIS
UNIT DIVISION: 0
Unit division: 0

## 2018-03-26 LAB — GLUCOSE, CAPILLARY
GLUCOSE-CAPILLARY: 169 mg/dL — AB (ref 65–99)
GLUCOSE-CAPILLARY: 134 mg/dL — AB (ref 65–99)
GLUCOSE-CAPILLARY: 140 mg/dL — AB (ref 65–99)
GLUCOSE-CAPILLARY: 139 mg/dL — AB (ref 65–99)
GLUCOSE-CAPILLARY: 148 mg/dL — AB (ref 65–99)
GLUCOSE-CAPILLARY: 125 mg/dL — AB (ref 65–99)
GLUCOSE-CAPILLARY: 98 mg/dL (ref 65–99)
GLUCOSE-CAPILLARY: 82 mg/dL (ref 65–99)
Glucose-Capillary: 148 mg/dL — ABNORMAL HIGH (ref 65–99)
Glucose-Capillary: 155 mg/dL — ABNORMAL HIGH (ref 65–99)
Glucose-Capillary: 129 mg/dL — ABNORMAL HIGH (ref 65–99)
Glucose-Capillary: 143 mg/dL — ABNORMAL HIGH (ref 65–99)

## 2018-03-26 LAB — PROTIME-INR
INR: 2.33
INR: 3.89
Prothrombin Time: 25.3 seconds — ABNORMAL HIGH (ref 11.4–15.2)
Prothrombin Time: 37.9 seconds — ABNORMAL HIGH (ref 11.4–15.2)

## 2018-03-26 LAB — BASIC METABOLIC PANEL
ANION GAP: 15 (ref 5–15)
Anion gap: 20 — ABNORMAL HIGH (ref 5–15)
BUN: 19 mg/dL (ref 6–20)
BUN: 21 mg/dL — AB (ref 6–20)
CHLORIDE: 98 mmol/L — AB (ref 101–111)
CHLORIDE: 98 mmol/L — AB (ref 101–111)
CO2: 25 mmol/L (ref 22–32)
CO2: 31 mmol/L (ref 22–32)
CREATININE: 2.1 mg/dL — AB (ref 0.44–1.00)
Calcium: 8.5 mg/dL — ABNORMAL LOW (ref 8.9–10.3)
Calcium: 8.7 mg/dL — ABNORMAL LOW (ref 8.9–10.3)
Creatinine, Ser: 2.25 mg/dL — ABNORMAL HIGH (ref 0.44–1.00)
GFR calc Af Amer: 34 mL/min — ABNORMAL LOW (ref >60)
GFR calc non Af Amer: 29 mL/min — ABNORMAL LOW (ref >60)
GFR, EST AFRICAN AMERICAN: 31 mL/min — AB (ref >60)
GFR, EST NON AFRICAN AMERICAN: 27 mL/min — AB (ref >60)
GLUCOSE: 142 mg/dL — AB (ref 65–99)
Glucose, Bld: 123 mg/dL — ABNORMAL HIGH (ref 65–99)
Potassium: 3.1 mmol/L — ABNORMAL LOW (ref 3.5–5.1)
Potassium: 3.5 mmol/L (ref 3.5–5.1)
SODIUM: 144 mmol/L (ref 135–145)
Sodium: 143 mmol/L (ref 135–145)

## 2018-03-26 LAB — BPAM FFP
BLOOD PRODUCT EXPIRATION DATE: 201905062359
BLOOD PRODUCT EXPIRATION DATE: 201905062359
BLOOD PRODUCT EXPIRATION DATE: 201905062359
BLOOD PRODUCT EXPIRATION DATE: 201905021945
BLOOD PRODUCT EXPIRATION DATE: 201905021945
Blood Product Expiration Date: 201905062359
ISSUE DATE / TIME: 201905010818
ISSUE DATE / TIME: 201905010818
ISSUE DATE / TIME: 201905011447
ISSUE DATE / TIME: 201905011447
ISSUE DATE / TIME: 201905012035
ISSUE DATE / TIME: 201905012035
UNIT TYPE AND RH: 1700
UNIT TYPE AND RH: 7300
UNIT TYPE AND RH: 7300
UNIT TYPE AND RH: 7300
Unit Type and Rh: 8400
Unit Type and Rh: 8400

## 2018-03-26 LAB — PREPARE FRESH FROZEN PLASMA
UNIT DIVISION: 0
UNIT DIVISION: 0
UNIT DIVISION: 0
UNIT DIVISION: 0
Unit division: 0
Unit division: 0

## 2018-03-26 LAB — CBC WITH DIFFERENTIAL/PLATELET
BASOS ABS: 0.1 10*3/uL (ref 0.0–0.1)
Basophils Relative: 1 %
EOS PCT: 0 %
Eosinophils Absolute: 0 10*3/uL (ref 0.0–0.7)
HEMATOCRIT: 24.7 % — AB (ref 36.0–46.0)
HEMOGLOBIN: 8.5 g/dL — AB (ref 12.0–15.0)
LYMPHS ABS: 0.7 10*3/uL (ref 0.7–4.0)
LYMPHS PCT: 7 %
MCH: 27.6 pg (ref 26.0–34.0)
MCHC: 34.4 g/dL (ref 30.0–36.0)
MCV: 80.2 fL (ref 78.0–100.0)
Monocytes Absolute: 0.5 10*3/uL (ref 0.1–1.0)
Monocytes Relative: 5 %
NEUTROS PCT: 87 %
Neutro Abs: 8.1 10*3/uL — ABNORMAL HIGH (ref 1.7–7.7)
Platelets: 24 10*3/uL — CL (ref 150–400)
RBC: 3.08 MIL/uL — ABNORMAL LOW (ref 3.87–5.11)
RDW: 16.4 % — ABNORMAL HIGH (ref 11.5–15.5)
WBC: 9.4 10*3/uL (ref 4.0–10.5)

## 2018-03-26 LAB — BPAM PLATELET PHERESIS
Blood Product Expiration Date: 201905032359
Blood Product Expiration Date: 201905022104
ISSUE DATE / TIME: 201905010818
ISSUE DATE / TIME: 201905012252
UNIT TYPE AND RH: 6200
Unit Type and Rh: 6200

## 2018-03-26 LAB — CBC
HEMATOCRIT: 23.2 % — AB (ref 36.0–46.0)
HEMATOCRIT: 24.3 % — AB (ref 36.0–46.0)
HEMOGLOBIN: 8.1 g/dL — AB (ref 12.0–15.0)
HEMOGLOBIN: 8.5 g/dL — AB (ref 12.0–15.0)
MCH: 28.3 pg (ref 26.0–34.0)
MCH: 28.1 pg (ref 26.0–34.0)
MCHC: 34.9 g/dL (ref 30.0–36.0)
MCHC: 35 g/dL (ref 30.0–36.0)
MCV: 81.1 fL (ref 78.0–100.0)
MCV: 80.2 fL (ref 78.0–100.0)
Platelets: 45 10*3/uL — ABNORMAL LOW (ref 150–400)
Platelets: 31 10*3/uL — ABNORMAL LOW (ref 150–400)
RBC: 2.86 MIL/uL — ABNORMAL LOW (ref 3.87–5.11)
RBC: 3.03 MIL/uL — ABNORMAL LOW (ref 3.87–5.11)
RDW: 16.3 % — AB (ref 11.5–15.5)
RDW: 16.6 % — ABNORMAL HIGH (ref 11.5–15.5)
WBC: 10.3 10*3/uL (ref 4.0–10.5)
WBC: 9.4 10*3/uL (ref 4.0–10.5)

## 2018-03-26 LAB — PREPARE CRYOPRECIPITATE
UNIT DIVISION: 0
Unit division: 0

## 2018-03-26 LAB — FIBRINOGEN
FIBRINOGEN: 175 mg/dL — AB (ref 210–475)
FIBRINOGEN: 171 mg/dL — AB (ref 210–475)
Fibrinogen: 176 mg/dL — ABNORMAL LOW (ref 210–475)

## 2018-03-26 LAB — BPAM CRYOPRECIPITATE
BLOOD PRODUCT EXPIRATION DATE: 201905010915
Blood Product Expiration Date: 201905011025
ISSUE DATE / TIME: 201905010432
UNIT TYPE AND RH: 5100
Unit Type and Rh: 5100

## 2018-03-26 LAB — PROCALCITONIN: Procalcitonin: 2.62 ng/mL

## 2018-03-26 MED ORDER — INSULIN DETEMIR 100 UNIT/ML ~~LOC~~ SOLN
15.0000 [IU] | Freq: Two times a day (BID) | SUBCUTANEOUS | Status: DC
  Administered 2018-03-26: 15 [IU] via SUBCUTANEOUS
  Filled 2018-03-26: qty 0.15

## 2018-03-26 MED ORDER — INSULIN ASPART 100 UNIT/ML ~~LOC~~ SOLN
1.0000 [IU] | SUBCUTANEOUS | Status: DC
  Administered 2018-03-26 (×2): 1 [IU] via SUBCUTANEOUS

## 2018-03-26 MED ORDER — FUROSEMIDE 10 MG/ML IJ SOLN
40.0000 mg | Freq: Once | INTRAMUSCULAR | Status: AC
  Administered 2018-03-26: 40 mg via INTRAVENOUS
  Filled 2018-03-26: qty 4

## 2018-03-26 MED ORDER — LACTATED RINGERS IV SOLN
INTRAVENOUS | Status: DC
  Administered 2018-03-26: 09:00:00 via INTRAVENOUS

## 2018-03-26 MED ORDER — INSULIN ASPART 100 UNIT/ML ~~LOC~~ SOLN: 3.0000 [IU] | SUBCUTANEOUS | Status: DC

## 2018-03-26 MED ORDER — INSULIN DETEMIR 100 UNIT/ML ~~LOC~~ SOLN: 15.0000 [IU] | Freq: Two times a day (BID) | SUBCUTANEOUS | Status: DC

## 2018-03-26 MED ORDER — POTASSIUM CHLORIDE 10 MEQ/50ML IV SOLN
10.0000 meq | Freq: Once | INTRAVENOUS | Status: AC
  Administered 2018-03-26: 10 meq via INTRAVENOUS
  Filled 2018-03-26: qty 50

## 2018-03-26 MED ORDER — PANTOPRAZOLE SODIUM 40 MG IV SOLR
40.0000 mg | Freq: Every day | INTRAVENOUS | Status: DC
  Administered 2018-03-27: 40 mg via INTRAVENOUS
  Filled 2018-03-26 (×2): qty 40

## 2018-03-26 MED ORDER — POTASSIUM CHLORIDE 10 MEQ/50ML IV SOLN
10.0000 meq | INTRAVENOUS | Status: AC
  Administered 2018-03-26 (×3): 10 meq via INTRAVENOUS
  Filled 2018-03-26 (×3): qty 50

## 2018-03-26 NOTE — Progress Notes (Signed)
Events reviewed. Nothing further to add at this time. Will sign off. Call us if needed.

## 2018-03-26 NOTE — Progress Notes (Signed)
eLink Physician-Brief Progress Note Patient Name: Jennifer Moore DOB: [birth year] MRN: [id number]   Date of Service  03/26/2018  HPI/Events of Note  Intermittent agitation  eICU Interventions  Soft bilateral wrist restraints for safety and to protect lines and ETT.        Thomasene Lot Ogan 03/26/2018, 8:20 PM

## 2018-03-26 NOTE — Progress Notes (Addendum)
Nutrition Follow-up  DOCUMENTATION CODES:   Morbid obesity  INTERVENTION:  RD to monitor status; poor prognosis Consult RD if pt status allows feeding  NUTRITION DIAGNOSIS:   Inadequate oral intake related to inability to eat as evidenced by NPO status.  Revised  GOAL:   Provide needs based on ASPEN/SCCM guidelines  Revised  MONITOR:   PO intake, Weight trends, Labs, Vent status, I & 58O's, Skin  REASON FOR ASSESSMENT:   Ventilator    ASSESSMENT:   37 year old female with past with history of anxiety, current alcohol abuse with alcoholic cirrhosis presents from home 4/23 via EMS for altered mental status and epistaxis.  Patient had epistaxis which would not stop and boyfriend called EMS.  On arrival EMS found the patient be altered.  Pt transferred from [facility]. Pt re-intubated 03/24/18 due to hemorrhage, hypotension, and hepatic encephalopathy. Per MD note pt with poor prognosis due to acute hepatic encephalopathy and ascitics with EtOH abuse. Pt has been bleeding from mouth per rounds 03/25/18; per MD note pt now with minimal bleeding.   Per rounds no plans to feed due to recurrent GI bleeding and poor prognosis.   Medications reviewed: folic acid, solu-cortef, novolog 1-3 units, MVI with minerals, protonix, thiamine.   Labs reviewed: K+ 3.1  (L), Cl 98 (L), BG 142 (H), creatinine 2.10 (H), anion gap 20 (H), GFR 29 (L), RBC 3.03 (L), hemoglobin 8.5 (L), HCT 24.3 (L), platelets 31 (L), PTT 25.3 (H).   Diet Order:   Diet Order           Diet NPO time specified  Diet effective now          EDUCATION NEEDS:   Not appropriate for education at this time  Skin:  Skin Assessment: Reviewed RN Assessment  Last BM:  4/30  Height:   Ht Readings from Last 1 Encounters:  03/12/2018  (1.448 m)    Weight:   Wt Readings from Last 1 Encounters:  03/26/18 211 lb 3.2 oz (95.8 kg)    Ideal Body Weight:  41.45 kg  BMI:  Body mass index is 45.7 kg/m.  Estimated  Nutritional Needs:   Kcal:  1050-1350 kcal  Protein:  100-115 grams  Fluid:  >/= 1.5 L or per MD    Sherrine Maples, Dietetic Intern

## 2018-03-26 NOTE — Progress Notes (Signed)
PULMONARY / CRITICAL CARE MEDICINE   Name: Jennifer Moore MRN: [id number] DOB: 1981-05-24    ADMISSION DATE:  03/03/2018 CONSULTATION DATE:  03/25/2018  REFERRING MD:  Blake Divine  CHIEF COMPLAINT:  Confusion, epistaxis  BRIEF:   37 y/o female with alcoholic cirrhosis was admitted on 4/23 with confusion and epistaxis.  Was intubated after admission, extubated 4/25 then transferred to the floor.  Re-intubated 4/30 in the setting of hemorrhage, hypotension and encephalopathy. Noted to have blood from nose overnight on 4/30.      SUBJECTIVE:  Transfused multiple units overnight Anuric No purposeful movements Some agitation Minimal bleeding now from mouth Remains on levophed  VITAL SIGNS: BP (!) 94/57   Pulse 76   Temp (!) 97.4 F (36.3 C)   Resp (!) 26   Ht  (1.448 m)   Wt 211 lb 3.2 oz (95.8 kg)   SpO2 93%   BMI 45.70 kg/m   HEMODYNAMICS: CVP:  [26 mmHg-36 mmHg] 26 mmHg  VENTILATOR SETTINGS: Vent Mode: PRVC FiO2 (%):  [60 %-70 %] 70 % Set Rate:  [26 bmp] 26 bmp Vt Set:  [400 mL] 400 mL PEEP:  [8 cmH20] 8 cmH20 Pressure Support:  [0 cmH20] 0 cmH20 Plateau Pressure:  [31 cmH20-39 cmH20] 35 cmH20  INTAKE / OUTPUT: I/O last 3 completed shifts: In: 18118.5 [I.V.:8645.5; NWGNF:6213; Other:1020; IV Piggyback:2185] Out: 1337 [Urine:37; Emesis/NG output:500; Other:800]  PHYSICAL EXAMINATION:  General:  In bed on vent HENT: NCAT ETT in place, nasal packing, facial swelling is massive, minimal bright red blood aspirated from mouth PULM: CTA B, vent supported breathing CV: RRR, no mgr GI: BS+, soft, nontender MSK: normal bulk and tone Derm: massive edema Neuro: sedated on vent      LABS:  BMET Recent Labs  Lab 03/25/18 0415 03/25/18 1130 03/26/18 0255  NA 144 144 143  K 4.0 3.5 3.1*  CL 110 102 98*  CO2 10* 15* 25  BUN CREATININE 1.97* 1.94* 2.10*  GLUCOSE 198* 273* 142*    Electrolytes Recent Labs  Lab 03/20/18 0500 03/21/18 0427   03/24/18 2057 03/25/18 0415 03/25/18 1130 03/26/18 0255  CALCIUM 8.3* 8.7*   < > 8.7* 8.3* 9.4 8.5*  MG 1.8 2.4  --  2.0 1.7  --   --   PHOS 4.4 5.0*  --  8.5*  --   --   --    < > = values in this interval not displayed.    CBC Recent Labs  Lab 03/25/18 1907 03/26/18 0128 03/26/18 0728  WBC 8.5 10.3 9.4  HGB 7.5* 8.1* 8.5*  HCT 21.8* 23.2* 24.3*  PLT 25* 45* 31*    Coag's Recent Labs  Lab 03/24/18 2030  03/25/18 1130 03/25/18 1900 03/26/18 0255  APTT 78*  --   --   --   --   INR 2.90  2.90   < > 2.54 2.73 2.33   < > = values in this interval not displayed.    Sepsis Markers Recent Labs  Lab 03/24/18 2058 03/24/18 2125 03/25/18 0101 03/25/18 0456 03/26/18 0255  LATICACIDVEN 16.2*  --  16.7* 15.6*  --   PROCALCITON  --  0.45  --   --  2.62    ABG Recent Labs  Lab 03/24/18 2123 03/25/18 0416 03/25/18 0642  PHART 7.308* 7.144* 7.248*  PCO2ART 31.8* 24.5* 26.9*  PO2ART 294* 354.0* 353.0*    Liver Enzymes Recent Labs  Lab 03/24/18 0346 03/24/18 2057 03/25/18  0415  AST 101* 273* 1,826*  ALT 34 50 270*  ALKPHOS 57 39 54  BILITOT 23.1* 14.4* 18.3*  ALBUMIN 1.5* 1.5* 1.8*    Cardiac Enzymes Recent Labs  Lab 03/24/18 2125 03/25/18 0101  TROPONINI 0.13* 0.33*    Glucose Recent Labs  Lab 03/26/18 0205 03/26/18 0304 03/26/18 0405 03/26/18 0514 03/26/18 0617 03/26/18 0820  GLUCAP 134* 140* 139* 129* 143* 148*    Imaging No results found.    STUDIES:  EGD 4/24 >> portal gastropathy ,no active bleed  CULTURES: Urine 4/27 >> ng Blood 5/1 >  Resp 4/30 >   ANTIBIOTICS:  Rocephin 5.1 Vanc 5/1 > x1 dose Mero 5/1 > 5/2  LINES/TUBES: RIJ CVL 4/23 >> out ETT 4/23 >> 4/25    DISCUSSION: 37 y/o female with advanced alcoholic liver disease who has ascites, hepatic encephalopathy and presumably pharyngeal tear causing severe hemorrhagic shock.    ASSESSMENT / PLAN:  PULMONARY A: Acute respiratory failure with  hypoxemia Epistaxis > no active bleeding on ENT exam 5/1 but there was a clot seen in the pharynx.  P:   Continue to monitor bleeding Full mechanical vent support VAP prevention Daily WUA/SBT  CARDIOVASCULAR A:  Hemorrhagic shock P:  Tele Wean off levophed  RENAL A:   AKI oliguria anasarca P:   Not a candidate for hemodialysis Monitor BMET and UOP Replace electrolytes as needed Lasix x1 KVO fluids  GASTROINTESTINAL A:   Advanced alcoholic cirrhosis Ascites  Acute on chronic liver failure from shock liver P:   Change PPI to daily No NG today  HEMATOLOGIC A:   Coagulopathy from cirrhosis Thrombocytopenia Hemorrhagic anemia P:  Hold off on platelet transfusion unless bleeding today Transfuse PRBC if Hgb < 7gm/dL  Hold off on FFP for now if minimal bleeding, doubt we'll be able to correct this with severe multi-organ failure  INFECTIOUS A:   HCAP P:   Stop meropenem, monitor cultures  ENDOCRINE A:   Hyperglycemia P:   SSI  NEUROLOGIC A:   Acute hepatic encephalopathy P:   RASS goal 0 to -1 PAD protocol prn fentanyl Lactulose when has gastric access  Discussed with aunt yesterday (only living relative with normal mental capacity) and explained that her prognosis is poor from severe multi-organ failure and and underlying severe incurable illness (cirrhosis).  I doubt she will survive.  Will monitor today with supportive care but will not escalate further as it will provide no medical benefit.  FAMILY  - Updates: mother has been asked to come to the hospital but she has severe dementia and her aunt hasn't been able to bring her in.   - Inter-disciplinary family meet or Palliative Care meeting due by:  day 7  My cc time 35 minutes  Heber Northfield, MD [place] [facility] Pager: [phone] Cell: [phone] After 3pm or if no response, call [phone]  03/26/2018, 8:55 AM

## 2018-03-26 NOTE — Plan of Care (Signed)
  Problem: Education: Goal: Knowledge of General Education information will improve Outcome: Progressing   Problem: Health Behavior/Discharge Planning: Goal: Ability to manage health-related needs will improve Outcome: Progressing   Problem: Clinical Measurements: Goal: Ability to maintain clinical measurements within normal limits will improve Outcome: Progressing Goal: Will remain free from infection Outcome: Progressing Goal: Diagnostic test results will improve Outcome: Progressing Goal: Respiratory complications will improve Outcome: Progressing Goal: Cardiovascular complication will be avoided Outcome: Progressing   Problem: Activity: Goal: Risk for activity intolerance will decrease Outcome: Progressing   Problem: Nutrition: Goal: Adequate nutrition will be maintained Outcome: Progressing   Problem: Coping: Goal: Level of anxiety will decrease Outcome: Progressing   Problem: Elimination: Goal: Will not experience complications related to bowel motility Outcome: Progressing   Problem: Safety: Goal: Ability to remain free from injury will improve Outcome: Progressing   Problem: Skin Integrity: Goal: Risk for impaired skin integrity will decrease Outcome: Progressing   Problem: Elimination: Goal: Will not experience complications related to urinary retention Outcome: Not Progressing

## 2018-03-27 ENCOUNTER — Inpatient Hospital Stay (HOSPITAL_COMMUNITY): Payer: Medicaid Other

## 2018-03-27 LAB — TYPE AND SCREEN
ABO/RH(D): B POS
Antibody Screen: NEGATIVE
Unit division: 0
Unit division: 0
Unit division: 0
Unit division: 0
Unit division: 0
Unit division: 0
Unit division: 0
Unit division: 0
Unit division: 0
Unit division: 0
Unit division: 0
Unit division: 0

## 2018-03-27 LAB — BPAM FFP
BLOOD PRODUCT EXPIRATION DATE: 201905052359
BLOOD PRODUCT EXPIRATION DATE: 201905052359
BLOOD PRODUCT EXPIRATION DATE: 201905052359
Blood Product Expiration Date: 201905052359
ISSUE DATE / TIME: 201904302203
ISSUE DATE / TIME: 201904302203
UNIT TYPE AND RH: 7300
Unit Type and Rh: 7300
Unit Type and Rh: 7300
Unit Type and Rh: 7300

## 2018-03-27 LAB — HEPATIC FUNCTION PANEL
ALBUMIN: 2.6 g/dL — AB (ref 3.5–5.0)
ALK PHOS: 87 U/L (ref 38–126)
ALT: 394 U/L — ABNORMAL HIGH (ref 14–54)
AST: 1235 U/L — ABNORMAL HIGH (ref 15–41)
BILIRUBIN DIRECT: 15.4 mg/dL — AB (ref 0.1–0.5)
BILIRUBIN TOTAL: 37.2 mg/dL — AB (ref 0.3–1.2)
Total Protein: 5.8 g/dL — ABNORMAL LOW (ref 6.5–8.1)

## 2018-03-27 LAB — PREPARE FRESH FROZEN PLASMA
UNIT DIVISION: 0
Unit division: 0
Unit division: 0
Unit division: 0

## 2018-03-27 LAB — BPAM RBC
Blood Product Expiration Date: 201905232359
Blood Product Expiration Date: 201905252359
Blood Product Expiration Date: 201905272359
Blood Product Expiration Date: 201905282359
Blood Product Expiration Date: 201905282359
Blood Product Expiration Date: 201905282359
Blood Product Expiration Date: 201905302359
Blood Product Expiration Date: 201905302359
Blood Product Expiration Date: 201905312359
Blood Product Expiration Date: 201905312359
Blood Product Expiration Date: 201905312359
Blood Product Expiration Date: 201905312359
ISSUE DATE / TIME: 201904300814
ISSUE DATE / TIME: 201904301238
ISSUE DATE / TIME: 201904302052
ISSUE DATE / TIME: 201904302052
ISSUE DATE / TIME: 201904302140
ISSUE DATE / TIME: 201904302140
ISSUE DATE / TIME: 201904302153
ISSUE DATE / TIME: 201904302153
ISSUE DATE / TIME: 201904302231
ISSUE DATE / TIME: 201905020859
Unit Type and Rh: 7300
Unit Type and Rh: 7300
Unit Type and Rh: 7300
Unit Type and Rh: 7300
Unit Type and Rh: 7300
Unit Type and Rh: 7300
Unit Type and Rh: 7300
Unit Type and Rh: 7300
Unit Type and Rh: 7300
Unit Type and Rh: 7300
Unit Type and Rh: 7300
Unit Type and Rh: 7300

## 2018-03-27 LAB — CBC WITH DIFFERENTIAL/PLATELET
BASOS PCT: 0 %
Basophils Absolute: 0 10*3/uL (ref 0.0–0.1)
EOS PCT: 0 %
Eosinophils Absolute: 0 10*3/uL (ref 0.0–0.7)
HEMATOCRIT: 24.3 % — AB (ref 36.0–46.0)
HEMOGLOBIN: 8.4 g/dL — AB (ref 12.0–15.0)
LYMPHS ABS: 0.5 10*3/uL — AB (ref 0.7–4.0)
Lymphocytes Relative: 6 %
MCH: 27.8 pg (ref 26.0–34.0)
MCHC: 34.6 g/dL (ref 30.0–36.0)
MCV: 80.5 fL (ref 78.0–100.0)
MONO ABS: 0.4 10*3/uL (ref 0.1–1.0)
MONOS PCT: 5 %
NEUTROS ABS: 7.5 10*3/uL (ref 1.7–7.7)
Neutrophils Relative %: 89 %
Platelets: 15 10*3/uL — CL (ref 150–400)
RBC: 3.02 MIL/uL — AB (ref 3.87–5.11)
RDW: 16.7 % — AB (ref 11.5–15.5)
WBC: 8.4 10*3/uL (ref 4.0–10.5)

## 2018-03-27 LAB — GLUCOSE, CAPILLARY
GLUCOSE-CAPILLARY: 81 mg/dL (ref 65–99)
Glucose-Capillary: 69 mg/dL (ref 65–99)
Glucose-Capillary: 92 mg/dL (ref 65–99)
Glucose-Capillary: 68 mg/dL (ref 65–99)
Glucose-Capillary: 89 mg/dL (ref 65–99)

## 2018-03-27 LAB — CULTURE, RESPIRATORY W GRAM STAIN

## 2018-03-27 LAB — CULTURE, RESPIRATORY

## 2018-03-27 MED ORDER — MORPHINE BOLUS VIA INFUSION
5.0000 mg | INTRAVENOUS | Status: DC | PRN
  Filled 2018-03-27: qty 20

## 2018-03-27 MED ORDER — ATROPINE SULFATE 1 % OP SOLN
2.0000 [drp] | Freq: Four times a day (QID) | OPHTHALMIC | Status: DC
  Administered 2018-03-27: 2 [drp] via SUBLINGUAL
  Filled 2018-03-27: qty 2

## 2018-03-27 MED ORDER — DEXTROSE 50 % IV SOLN
25.0000 mL | Freq: Once | INTRAVENOUS | Status: AC
  Administered 2018-03-27: 25 mL via INTRAVENOUS
  Filled 2018-03-27: qty 50

## 2018-03-27 MED ORDER — MORPHINE 100MG IN NS 100ML (1MG/ML) PREMIX INFUSION
10.0000 mg/h | INTRAVENOUS | Status: DC
  Administered 2018-03-27: 10 mg/h via INTRAVENOUS
  Filled 2018-03-27: qty 100

## 2018-03-27 MED ORDER — DEXTROSE 50 % IV SOLN
INTRAVENOUS | Status: AC
  Administered 2018-03-27: 50 mL
  Filled 2018-03-27: qty 50

## 2018-03-27 MED ORDER — DEXTROSE 50 % IV SOLN
INTRAVENOUS | Status: AC
  Administered 2018-03-27: 25 mL via INTRAVENOUS
  Filled 2018-03-27: qty 50

## 2018-03-28 LAB — PREPARE PLATELET PHERESIS: Unit division: 0

## 2018-03-28 LAB — BPAM PLATELET PHERESIS
BLOOD PRODUCT EXPIRATION DATE: 201905022359
ISSUE DATE / TIME: 201904302258
UNIT TYPE AND RH: 5100

## 2018-03-29 LAB — TYPE AND SCREEN
ABO/RH(D): B POS
ANTIBODY SCREEN: NEGATIVE
UNIT DIVISION: 0
Unit division: 0
Unit division: 0
Unit division: 0

## 2018-03-29 LAB — BPAM RBC
BLOOD PRODUCT EXPIRATION DATE: 201905312359
Blood Product Expiration Date: 201905222359
Blood Product Expiration Date: 201905302359
Blood Product Expiration Date: 201905302359
ISSUE DATE / TIME: 201905011619
ISSUE DATE / TIME: 201905012251
UNIT TYPE AND RH: 7300
UNIT TYPE AND RH: 7300
UNIT TYPE AND RH: 7300
Unit Type and Rh: 7300

## 2018-03-30 ENCOUNTER — Telehealth: Payer: Self-pay

## 2018-03-30 LAB — CULTURE, BLOOD (ROUTINE X 2)
CULTURE: NO GROWTH
CULTURE: NO GROWTH
Special Requests: ADEQUATE
Special Requests: ADEQUATE

## 2018-03-30 NOTE — Telephone Encounter (Signed)
On 03/30/18 I received a d/c from [employer] (original). The d/c is for cremation. The patient is a patient of Doctor McQuaid. The d/c will be taken to [facility] for signature.  On 03/31/18 I received the d/c back from Doctor Craige Cotta who signed the d/c for Doctor McQuaid. I got the d/c ready and called the funeral home to let them know the d/c is ready for pickup. I also faxed a copy to the funeral home per the funeral home request.

## 2018-04-25 NOTE — Progress Notes (Signed)
   04-10-2018 1600  Clinical Encounter Type  Visited With Patient and family together  Visit Type Initial  Referral From Nurse  Consult/Referral To Chaplain  Spiritual Encounters  Spiritual Needs Prayer;Emotional;Grief support  Stress Factors  Patient Stress Factors Exhausted  Family Stress Factors Exhausted    Pt had just passed when I arrived. Aunt present in emotional distress , talked about Pt's mother suffering from dementia at a an assisted living facility. Chaplain provided pt placement card, compassionate presence and prayer.  Pina Sirianni a Water quality scientist, [employer]

## 2018-04-25 NOTE — Progress Notes (Signed)
Pt extubated per withdrawal order.  RNs at bedside.

## 2018-04-25 NOTE — Progress Notes (Signed)
Pt's aunt, Eunice Blase, and boyfriend, Leonette Most, at bedside with patient. Patient looking comfortable on morphine gtt when extubated. At 15:12 pt noted with no heart rate, no respirations, and verified by this nurse and Montine Circle RN. Provided comfort to the family. Dr. Kendrick Fries made aware.

## 2018-04-25 NOTE — Progress Notes (Signed)
Hypoglycemic Event  CBG: 69  Treatment: D50 IV 25 mL  Symptoms: None  Follow-up CBG: Time: 429 CBG Result: 92  Possible Reasons for Event: Inadequate meal intake    Jennifer Moore

## 2018-04-25 NOTE — Progress Notes (Signed)
PULMONARY / CRITICAL CARE MEDICINE   Name: Jennifer Moore MRN: [id number] DOB: 1980/12/19    ADMISSION DATE:  03/09/2018 CONSULTATION DATE:  03/25/2018  REFERRING MD:  Blake Divine  CHIEF COMPLAINT:  Confusion, epistaxis  BRIEF:   37 y/o female with alcoholic cirrhosis was admitted on 4/23 with confusion and epistaxis.  Was intubated after admission, extubated 4/25 then transferred to the floor.  Re-intubated 4/30 in the setting of hemorrhage, hypotension and encephalopathy. Noted to have blood from nose overnight on 4/30.      SUBJECTIVE:  Restless, writhing around the bed.  Does not follow commands remains pressor dependent and is Oliguric   VITAL SIGNS: Blood Pressure (Abnormal) 103/53   Pulse 76   Temperature (Abnormal) 97.3 F (36.3 C)   Respiration (Abnormal) 0   Height  (1.448 m)   Weight 206 lb 12.7 oz (93.8 kg)   Oxygen Saturation 97%   Body Mass Index 44.75 kg/m   HEMODYNAMICS: CVP:  [21 mmHg-29 mmHg] 25 mmHg  VENTILATOR SETTINGS: Vent Mode: PRVC FiO2 (%):  [50 %-70 %] 50 % Set Rate:  [26 bmp] 26 bmp Vt Set:  [400 mL] 400 mL PEEP:  [8 cmH20] 8 cmH20 Plateau Pressure:  [30 cmH20-34 cmH20] 30 cmH20  INTAKE / OUTPUT: I/O last 3 completed shifts: In: 4850.4 [I.V.:2977.4; Blood:1513; Other:10; IV Piggyback:350] Out: 33 [Urine:33]  PHYSICAL EXAMINATION:  General: Sedated critically ill 37 year old female she is awake but agitated and restless in the bed she does not follow commands or interact HEENT sclera are icteric both nares are patent orally intubated continues to have bloody oral drainage she has a left IJ triple-lumen catheter dressing oozing Pulmonary: Diffuse rales and rhonchi, equal chest rise, Cardiac: Regular rate and rhythm Extremities/musculoskeletal: Moves all extremities.  She has massive and diffuse anasarca over her entire body.  Extremities are cool, pulses are palpable. GI: Distended, pitting edema, hypoactive bowel sounds. GU: Dark-colored  urine scant amount of catheter drainage system Neuro: Eyes open, writhing around in the bed.  Does not follow commands.  Moves all extremities  LABS:  BMET Recent Labs  Lab 03/25/18 1130 03/26/18 0255 03/26/18 1355  NA 144 143 144  K 3.5 3.1* 3.5  CL 102 98* 98*  CO2 15* 25 31  BUN 19 19 21*  CREATININE 1.94* 2.10* 2.25*  GLUCOSE 273* 142* 123*    Electrolytes Recent Labs  Lab 03/21/18 0427  03/24/18 2057 03/25/18 0415 03/25/18 1130 03/26/18 0255 03/26/18 1355  CALCIUM 8.7*   < > 8.7* 8.3* 9.4 8.5* 8.7*  MG 2.4  --  2.0 1.7  --   --   --   PHOS 5.0*  --  8.5*  --   --   --   --    < > = values in this interval not displayed.    CBC Recent Labs  Lab 03/26/18 0728 03/26/18 1355 04/18/2018 0330  WBC 9.4 9.4 8.4  HGB 8.5* 8.5* 8.4*  HCT 24.3* 24.7* 24.3*  PLT 31* 24* 15*    Coag's Recent Labs  Lab 03/24/18 2030  03/25/18 1900 03/26/18 0255 03/26/18 1355  APTT 78*  --   --   --   --   INR 2.90  2.90   < > 2.73 2.33 3.89   < > = values in this interval not displayed.    Sepsis Markers Recent Labs  Lab 03/24/18 2058 03/24/18 2125 03/25/18 0101 03/25/18 0456 03/26/18 0255  LATICACIDVEN 16.2*  --  16.7* 15.6*  --  PROCALCITON  --  0.45  --   --  2.62    ABG Recent Labs  Lab 03/24/18 2123 03/25/18 0416 03/25/18 0642  PHART 7.308* 7.144* 7.248*  PCO2ART 31.8* 24.5* 26.9*  PO2ART 294* 354.0* 353.0*    Liver Enzymes Recent Labs  Lab 03/24/18 2057 03/25/18 0415 04/23/2018 0330  AST 273* 1,826* 1,235*  ALT 50 270* 394*  ALKPHOS 39 54 87  BILITOT 14.4* 18.3* 37.2*  ALBUMIN 1.5* 1.8* 2.6*    Cardiac Enzymes Recent Labs  Lab 03/24/18 2125 03/25/18 0101  TROPONINI 0.13* 0.33*    Glucose Recent Labs  Lab 03/26/18 2013 03/25/2018 0007 03/26/2018 0400 04/17/2018 0428 03/26/2018 0745 04/09/2018 1120  GLUCAP 82 81 69 92 68 89    Imaging Dg Chest Port 1 View  Result Date: 04/04/2018 CLINICAL DATA:  Acute respiratory failure with  hypoxemia. EXAM: PORTABLE CHEST 1 VIEW COMPARISON:  Radiograph of Mar 25, 2018. FINDINGS: Stable cardiomegaly with central pulmonary vascular congestion is noted. Endotracheal tube is in good position. Nasogastric tube has been removed. Stable position of left internal jugular catheter with tip in right atrium. No pneumothorax is noted. Mildly increased bilateral lung opacities are noted concerning for worsening edema or pneumonia. Mild right pleural effusion is noted. Bony thorax is unremarkable. IMPRESSION: Stable cardiomegaly with central pulmonary vascular congestion. Endotracheal tube in grossly good position. Mildly increased bilateral pulmonary edema or pneumonia is noted, right greater than left. Mild right pleural effusion is noted. Electronically Signed   By: Lupita Raider, M.D.   On: 04/01/2018 07:18      STUDIES:  EGD 4/24 >> portal gastropathy ,no active bleed  CULTURES: Urine 4/27 >> ng Blood 5/1 >  Resp 4/30 >   ANTIBIOTICS:  Rocephin 5.1 Vanc 5/1 > x1 dose Mero 5/1 > 5/2  LINES/TUBES: RIJ CVL 4/23 >> out ETT 4/23 >> 4/25    DISCUSSION:   ASSESSMENT / PLAN:   Acute respiratory failure with hypoxemia c/b HCAP Epistaxis > no active bleeding on ENT exam 5/1 but there was a clot seen in the pharynx.  Circulatory shock: mix of Hemorrhagic shock and perhaps general intravascular hypovolemia  Advanced alcoholic cirrhosis w/ acute on chronic liver failure from shock liver Ascites  Acute hepatic encephalopathy Coagulopathy from cirrhosis Thrombocytopenia Hemorrhagic anemia   Discussion End-stage cirrhosis in the setting of ongoing alcoholism with acute on chronic liver failure from shock liver, acutely complicated by hemorrhagic shock, worsening encephalopathy and now progressive MODS in setting of Circulatory shock, think initially hemorrhagic but now seems to be element of on-going distributive process. Although her bleeding seems to have stabilized , over the  last 48 hours she has developed worsening renal failure, her chest x-ray demonstrates worsening pulmonary edema and now what appears to be increasing right effusion.  She is massively volume overloaded.  Had a long discussion  with her aunt as well as her significant other.  They are aware that this is not survivable.  There biggest concern at this point is focusing on the patient's comfort and dignity.  After long discussion and answering all questions the patient's love ones have requested we discontinue life support and focus on comfort.  Plan We will discontinue all labs Discontinue all medications that do not directly results and comfort Discontinue antibiotics Start morphine drip After morphine drip initiated we will extubate once we are sure she is comfortable   40 minutes of time was dedicated to family discussion at bedside  Simonne Martinet ACNP-BC  [facility] Pulmonary/Critical Care Pager # [phone] OR # [phone] if no answer  03/28/2018, 12:43 PM

## 2018-04-25 NOTE — Progress Notes (Signed)
Wasted 60cc morphine gtt in sink with [place].

## 2018-04-25 NOTE — Progress Notes (Signed)
CRITICAL VALUE ALERT  Critical Value:  Total Bili 37.2  Date & Time Notied:  Apr 06, 2018  Provider Notified: Pola Corn MD  Orders Received/Actions taken: awaiting orders, see [facility]

## 2018-04-25 NOTE — Death Summary Note (Signed)
DEATH SUMMARY   Patient Details  Name: Jennifer Moore MRN: [id number] DOB: Jan 18, 1981  Admission/Discharge Information   Admit Date:  04-14-18  Date of Death:    Time of Death:    Length of Stay: 05/02/23  Referring Physician: Elliot Cousin., MD   Reason(s) for Hospitalization  Epistaxis  Diagnoses  Preliminary cause of death:   Alcoholic Cirrhosis Secondary Diagnoses (including complications and co-morbidities):  Active Problems:   Substance use disorder   Alcohol use disorder, severe, dependence (HCC)   Anemia   Hyperammonemia (HCC)   Alcoholic hepatitis with ascites   Hypomagnesemia   GI bleed   Thrombocytopenia (HCC)   Hypokalemia   Coagulopathy (HCC)   Hypoalbuminemia   Hypotension   Elevated bilirubin   Hepatic encephalopathy (HCC)   Jaundice   Liver failure without hepatic coma (HCC)   Poor intravenous access   Shock circulatory [facility])   Brief Hospital Course (including significant findings, care, treatment, and services provided and events leading to death)  Jennifer Moore is a 37 y.o. year old female who has advanced alcoholic cirrhosis and was admitted to [facility] in the setting of epistaxis.  She was admitted, noted to be confused.  She was treated initially with a transfusion.  GI was following, managing her with diuretics and treated for EtOH withdrawal.  She was treated with prednisone as well as rifaximin and lactulose.  Her condition deteriorated in that her mental status and hemoglobin worsened.  She required intubation and mechanical ventilation.  She was noted to have worsening bleeding from her nasopharynx which required substantial resuscitation with multiple units of blood products and eventually vasopressors.  She was transferred to [facility] for further evaluation.  She received several more days of mechanical ventilatory support and ongoing resuscitation with vasopressors.  However despite these measures the patient's renal  function worsened significantly.  We discussed goals of care with the patient's family and explained that considering the multiple predictors of a poor prognosis from cirrhosis with ongoing drinking there was little chance of transplant free survival.  They elected to withdraw care and she died peacefully with the family at the bedside.   Pertinent Labs and Studies  Significant Diagnostic Studies Ct Abdomen Pelvis Wo Contrast  Result Date: 03/24/2018 CLINICAL DATA:  Altered mental status and epistaxis. Current alcohol abuse. Alcoholic cirrhosis. Abdominal distention. EXAM: CT ABDOMEN AND PELVIS WITHOUT CONTRAST TECHNIQUE: Multidetector CT imaging of the abdomen and pelvis was performed following the standard protocol without IV contrast. COMPARISON:  Limited right upper quadrant abdomen ultrasound dated 03/20/2018. Abdomen pelvis CT dated 08/04/2009. FINDINGS: Lower chest: Confluent airspace opacity with volume loss in both lower lobes. There is also a patchy interstitial prominence in the anterior left lung base and prominent interstitial markings and pulmonary vasculature of both lung bases. Small to moderate-sized right pleural effusion. Minimal left pleural effusion. Pericardial effusion with a maximum thickness of 13 mm. Probable partially calcified granuloma at the right lung base measuring 7 mm on image number 10 series 3. This was not previously included. Hepatobiliary: Small right lobe of the liver and enlarged lateral segment left lobe and caudate lobe. The liver contours are mildly irregular. The liver is diffusely heterogeneous. 1.5 cm gallstone in the gallbladder neck. 7 mm gallstone in the cystic duct. Poorly distended gallbladder with mild diffuse wall thickening. No pericholecystic fluid. Pancreas: Unremarkable. No pancreatic ductal dilatation or surrounding inflammatory changes. Spleen: Normal in size without focal abnormality. Adrenals/Urinary Tract: Foley catheter in  the urinary bladder  with no urine in the bladder. Unremarkable adrenal glands, kidneys and visualized portions of the ureters. Stomach/Bowel: Small hiatal hernia containing oral contrast. Nasogastric tube tip in the duodenal bulb. Oral contrast in the proximal jejunum. No bowel dilatation seen. Diffuse low-density colon wall thickening, including the rectum. Mildly prominent proximal jejunal loops with mild mucosal thickening. Vascular/Lymphatic: No significant vascular findings are present. No enlarged abdominal or pelvic lymph nodes. Reproductive: Uterus and bilateral adnexa are unremarkable. Other: Moderate amount of free peritoneal fluid. Diffuse subcutaneous edema. Probable post injection hematomas or granulomas in the right buttock region, the largest measuring 3.2 cm in maximum diameter on image number 69 series 2. Musculoskeletal: Normal appearing bones. IMPRESSION: 1. Changes of cirrhosis of the liver with an associated moderate amount of ascites and diffuse subcutaneous edema. 2. Cholelithiasis. 3. Mild diffuse gallbladder wall thickening. This may be at least partially artifactual due to poor distention of the gallbladder. Chronic cholecystitis is also a possibility. No definite changes of acute cholecystitis. 4. Bilateral lower lobe atelectasis and possible pneumonia. 5. Patchy interstitial prominence at the anterior left lung base, suspicious for interstitial pneumonitis. 6. Pulmonary vascular congestion and possible interstitial pulmonary edema at both lung bases. 7. Small hiatal hernia. 8. Diffuse low-density colon wall thickening. This could be due to colitis or hypoproteinemia. Electronically Signed   By: Beckie Salts M.D.   On: 03/24/2018 18:12   Dg Abd 1 View  Result Date: 03/19/2018 CLINICAL DATA:  Nasogastric placement. EXAM: ABDOMEN - 1 VIEW COMPARISON:  2018/03/24 FINDINGS: Nasogastric tube enters the stomach with its tip in the region of the antrum. IMPRESSION: Nasogastric tube tip at the gastric antrum.  Electronically Signed   By: Paulina Fusi M.D.   On: 03/19/2018 13:49   Dg Abd 1 View  Result Date: 03-24-2018 CLINICAL DATA:  NG tube placement EXAM: ABDOMEN - 1 VIEW COMPARISON:  02/26/2018 FINDINGS: NG tube within the proximal to mid stomach.  Stomach is collapsed. Low lung volumes with vascular congestion and bibasilar atelectasis versus airspace disease. Heart is enlarged. No large effusion. No acute osseous finding. IMPRESSION: NG tube within the proximal to mid stomach. Electronically Signed   By: Judie Petit.  Shick M.D.   On: 03-24-2018 18:25   Ct Head Wo Contrast  Result Date: 02/26/2018 CLINICAL DATA:  Changing consciousness. EXAM: CT HEAD WITHOUT CONTRAST TECHNIQUE: Contiguous axial images were obtained from the base of the skull through the vertex without intravenous contrast. COMPARISON:  September 25, 2017 FINDINGS: Brain: Evaluation somewhat limited due to patient motion. A coarse calcification in the left frontal lobe is stable given difference in positioning. This is likely due to remote trauma and is unchanged since 2018. No subdural, epidural, or subarachnoid hemorrhage. No mass effect or midline shift. Ventricles and sulci are unremarkable. Cerebellum and brainstem are normal. Basal cisterns are patent. No acute cortical ischemia or infarct. Vascular: No hyperdense vessel or unexpected calcification. Skull: Normal. Negative for fracture or focal lesion. Sinuses/Orbits: No acute finding. Other: None. IMPRESSION: 1. The study is limited due to patient motion. Within these limitations, no acute intracranial abnormality is identified. Electronically Signed   By: Gerome Sam III M.D   On: 02/24/2018 20:07   Dg Chest Port 1 View  Result Date: 04/10/2018 CLINICAL DATA:  Acute respiratory failure with hypoxemia. EXAM: PORTABLE CHEST 1 VIEW COMPARISON:  Radiograph of Mar 25, 2018. FINDINGS: Stable cardiomegaly with central pulmonary vascular congestion is noted. Endotracheal tube is in good position.  Nasogastric tube has  been removed. Stable position of left internal jugular catheter with tip in right atrium. No pneumothorax is noted. Mildly increased bilateral lung opacities are noted concerning for worsening edema or pneumonia. Mild right pleural effusion is noted. Bony thorax is unremarkable. IMPRESSION: Stable cardiomegaly with central pulmonary vascular congestion. Endotracheal tube in grossly good position. Mildly increased bilateral pulmonary edema or pneumonia is noted, right greater than left. Mild right pleural effusion is noted. Electronically Signed   By: Lupita Raider, M.D.   On: 29-Mar-2018 07:18   Dg Chest Port 1 View  Result Date: 03/25/2018 CLINICAL DATA:  Endotracheal tube placement EXAM: PORTABLE CHEST 1 VIEW COMPARISON:  03/24/2018 FINDINGS: Endotracheal tube with tip measuring 2 cm above the carina. Enteric tube tip is projected over the right upper quadrant consistent with location in the distal stomach. Left central venous catheter with tip over the cavoatrial junction region. No pneumothorax. Shallow inspiration with atelectasis in the lung bases. Cardiac enlargement. Pulmonary vascular congestion. No definite edema or consolidation. Right pleural effusion. IMPRESSION: Appliances appear in satisfactory position. Shallow inspiration with atelectasis in the lung bases. Cardiac enlargement with pulmonary vascular congestion and right pleural effusion. Electronically Signed   By: Burman Nieves M.D.   On: 03/25/2018 04:35   Dg Chest Port 1 View  Result Date: 03/24/2018 CLINICAL DATA:  Atelectasis, endotracheal tube adjustment. EXAM: PORTABLE CHEST 1 VIEW COMPARISON:  Radiograph of same day. FINDINGS: Stable cardiomediastinal silhouette. Endotracheal tube is seen projected over tracheal air shadow with distal tip 2 cm above the carina. Left internal jugular catheter is noted with distal tip in expected position of cavoatrial junction. Distal tip of nasogastric tube seen in distal  stomach. Hypoinflation of the lungs is noted. Mild bibasilar subsegmental atelectasis is noted. Significantly improved aeration of left lung is noted compared to prior exam. Bony thorax is unremarkable. IMPRESSION: Endotracheal and nasogastric tubes are in grossly good position. Hypoinflation of the lungs is noted with mild bibasilar subsegmental atelectasis. There is significantly improved aeration of left lung compared to prior exam. Electronically Signed   By: Lupita Raider, M.D.   On: 03/24/2018 16:23   Dg Chest Port 1 View  Addendum Date: 03/24/2018   ADDENDUM REPORT: 03/24/2018 15:21 ADDENDUM: Study discussed by telephone with Dr. Blake Divine on 03/24/2018 at 1503 hours. Electronically Signed   By: Odessa Fleming M.D.   On: 03/24/2018 15:21   Result Date: 03/24/2018 CLINICAL DATA:  37 year old female status post intubation for airway protection. EXAM: PORTABLE CHEST 1 VIEW COMPARISON:  03/21/2018. FINDINGS: Portable AP semi upright view at 1425 hours. Kyphotic positioning and low lung volumes. There is a right mainstem bronchus intubation. The ET tube tip is approximately 2 centimeters distal to the carina. Associated left lung atelectasis with air bronchograms and leftward mediastinal shift. No pneumothorax identified. The right lung is clear when allowing for portable technique. The right IJ central line has been replaced with a left IJ approach central line, tip at the right atrium level currently given the lung findings. Enteric tube appears stable. Paucity bowel gas in the upper abdomen. IMPRESSION: 1. Right mainstem bronchus intubation with left lung collapse. Recommend retracting the ET tube 2 cm and repeating a portable chest x-ray. 2. Left IJ approach central line now in place.  Stable enteric tube. Electronically Signed: By: Odessa Fleming M.D. On: 03/24/2018 14:59   Dg Chest Port 1 View  Result Date: 03/21/2018 CLINICAL DATA:  Check central line placement. EXAM: PORTABLE CHEST 1 VIEW COMPARISON:  03/12/2018  FINDINGS: The endotracheal tube has been removed. The NG tube is stable. The right IJ central venous catheter tip is in the right atrium and appears unchanged. Suspect layering right pleural effusion and right lower lobe atelectasis. No pneumothorax. Vascular congestion and possible mild interstitial edema. IMPRESSION: 1. Interval removal of the NG tube. The right IJ catheter and NG tubes are stable. 2. Vascular congestion, probable mild pulmonary edema and suspect layering right pleural effusion. Electronically Signed   By: Rudie Meyer M.D.   On: 03/21/2018 11:18   Dg Chest Portable 1 View  Result Date: 03/14/2018 CLINICAL DATA:  ETT adjustment EXAM: PORTABLE CHEST 1 VIEW COMPARISON:  03/05/2018, 08/10/2017 FINDINGS: Endotracheal tube tip is about 2.3 cm superior to the carina. Esophageal tube tip is below the diaphragm but non included. Right-sided central venous catheter tip overlies the right atrium. Low lung volumes. Stable cardiomediastinal silhouette with vascular congestion. No pleural effusion. IMPRESSION: 1. Endotracheal tube tip about 2.3 cm superior to the carina 2. Right IJ central venous catheter tip overlies the right atrium 3. Low lung volumes with vascular congestion and probable mild edema Electronically Signed   By: Jasmine Pang M.D.   On: 03/19/2018 23:59   Dg Chest Portable 1 View  Result Date: 02/28/2018 CLINICAL DATA:  Intubation, orogastric tube placement. EXAM: PORTABLE CHEST 1 VIEW COMPARISON:  Chest radiograph March 17, 2018 1817 hours FINDINGS: RIGHT mainstem bronchus intubation. RIGHT internal jugular central venous catheter distal tip projects in proximal RIGHT atrium. Nasogastric tube tip projects in mid stomach. Cardiomediastinal silhouette is normal for this low inspiratory examination. Increasing interstitial and alveolar airspace opacities. RIGHT lung base granuloma. No pleural effusion. No pneumothorax. Osseous structures are unchanged. IMPRESSION: 1. RIGHT mainstem  bronchus intubation, recommend 2 cm retraction. RIGHT IJ line tip projects in proximal RIGHT atrium, consider 1-2 cm retraction. Nasogastric tube tip projects in mid stomach. 2. Increasing interstitial and alveolar airspace opacities seen with pulmonary edema and/or pneumonia. 3. Recommendations discussed with Aurther Loft, RN in ED on 03/06/2018 at 11:10 pm. Electronically Signed   By: Awilda Metro M.D.   On: 03/02/2018 23:11   Dg Chest Port 1 View  Result Date: 03/20/2018 CLINICAL DATA:  Liver failure, epistaxis and confusion. EXAM: PORTABLE CHEST 1 VIEW COMPARISON:  08/10/2017 FINDINGS: The heart size and mediastinal contours are within normal limits. Both lungs are clear. The visualized skeletal structures are unremarkable. IMPRESSION: No active disease. Electronically Signed   By: Tollie Eth M.D.   On: 02/25/2018 18:38   [place] Ekg Site Rite  Result Date: 03/24/2018 If Site Rite image not attached, placement could not be confirmed due to current cardiac rhythm.  US Abdomen Limited Ruq  Result Date: 02/24/2018 CLINICAL DATA:  Jaundice. EXAM: ULTRASOUND ABDOMEN LIMITED RIGHT UPPER QUADRANT COMPARISON:  None. FINDINGS: Gallbladder: There are no gallstones. There is slight thickening of the gallbladder wall. Slight sludge in the gallbladder. Common bile duct: Diameter: 5.1 mm, normal. Liver: There is increased echogenicity of the liver with nodularity of the liver contour with adjacent ascites. No biliary dilatation. Reverse flow in the portal vein. IMPRESSION: 1. Cirrhosis with ascites and portal hypertension. 2. Thickened gallbladder wall which is probably due to hypoproteinemia. Electronically Signed   By: Francene Boyers M.D.   On: 03/23/2018 11:56    Microbiology Recent Results (from the past 240 hour(s))  MRSA PCR Screening     Status: None   Collection Time: 02/23/2018  4:34 AM  Result Value Ref Range Status   MRSA by  PCR NEGATIVE NEGATIVE Final    Comment:        The GeneXpert MRSA Assay  (FDA approved for NASAL specimens only), is one component of a comprehensive MRSA colonization surveillance program. It is not intended to diagnose MRSA infection nor to guide or monitor treatment for MRSA infections. Performed at [facility], [address], [place], [place] [address]   Culture, Urine     Status: None   Collection Time: 03/21/18  1:47 PM  Result Value Ref Range Status   Specimen Description   Final    URINE, RANDOM Performed at [facility], [address] [place], [place] [address]    Special Requests   Final    NONE Performed at [facility], [address], [place], [place] [address]    Culture   Final    NO GROWTH Performed at [facility], [address], [place], [place] [address]    Report Status 03/24/2018 FINAL  Final  Culture, blood (routine x 2)     Status: None (Preliminary result)   Collection Time: 03/24/18 11:32 PM  Result Value Ref Range Status   Specimen Description   Final    BLOOD CENTRAL LINE Performed at [facility], [address] [place], [place] [address]    Special Requests   Final    BOTTLES DRAWN AEROBIC AND ANAEROBIC Blood Culture adequate volume Performed at [facility], [address], [place], [place] [address]    Culture   Final    NO GROWTH 2 DAYS Performed at [facility], [address], [place], [place] [address]    Report Status PENDING  Incomplete  Culture, blood (routine x 2)     Status: None (Preliminary result)   Collection Time: 03/24/18 11:50 PM  Result Value Ref Range Status   Specimen Description   Final    BLOOD CENTRAL LINE Performed at [facility], [address] [place], [place] [address]    Special Requests   Final    BOTTLES DRAWN AEROBIC AND ANAEROBIC Blood Culture adequate volume Performed at [facility], [address] [place], [place]  [address]    Culture   Final    NO GROWTH 2 DAYS Performed at [facility], [address], [place], [place] [address]    Report Status PENDING  Incomplete  Culture, respiratory (NON-Expectorated)     Status: None   Collection Time: 03/25/18  4:40 AM  Result Value Ref Range Status   Specimen Description TRACHEAL ASPIRATE  Final   Special Requests NONE  Final   Gram Stain   Final    RARE WBC PRESENT,BOTH PMN AND MONONUCLEAR RARE YEAST Performed at [facility], [address] [place], [place] [address]    Culture FEW CANDIDA DUBLINIENSIS  Final   Report Status 04/22/2018 FINAL  Final    Lab Basic Metabolic Panel: Recent Labs  Lab 03/21/18 0427  03/24/18 2057 03/25/18 0415 03/25/18 1130 03/26/18 0255 03/26/18 1355  NA 140   < > 141 144 144 143 144  K 4.3   < > 4.5 4.0 3.5 3.1* 3.5  CL 112*   < > 111 110 102 98* 98*  CO2 21*   < > 9* 10* 15* 25 31  GLUCOSE 94   < > 160* 198* 273* 142* 123*  BUN 8   < > 24* 21*  CREATININE 0.32*   < > 1.61* 1.97* 1.94* 2.10* 2.25*  CALCIUM 8.7*   < > 8.7* 8.3* 9.4 8.5* 8.7*  MG 2.4  --  2.0 1.7  --   --   --   PHOS 5.0*  --  8.5*  --   --   --   --    < > = values in this interval not displayed.   Liver Function Tests: Recent Labs  Lab 03/24/18 0346 03/24/18 2057 03/25/18 0415 04/11/2018 0330  AST 101* 273* 1,826* 1,235*  ALT 34 50 270* 394*  ALKPHOS 57 39 54 87  BILITOT 23.1* 14.4* 18.3* 37.2*  PROT 5.4* 3.9* 4.2* 5.8*  ALBUMIN 1.5* 1.5* 1.8* 2.6*   No results for input(s): LIPASE, AMYLASE in the last 168 hours. Recent Labs  Lab 03/21/18 0429 03/24/18 0649  AMMONIA 110* 58*   CBC: Recent Labs  Lab 03/25/18 1907 03/26/18 0128 03/26/18 0728 03/26/18 1355 03/26/2018 0330  WBC 8.5 10.3 9.4 9.4 8.4  NEUTROABS  --   --   --  8.1* 7.5  HGB 7.5* 8.1* 8.5* 8.5* 8.4*  HCT 21.8* 23.2* 24.3* 24.7* 24.3*  MCV 82.9 81.1 80.2 80.2 80.5  PLT 25* 45* 31* 24* 15*   Cardiac Enzymes: Recent Labs  Lab  03/24/18 2125 03/25/18 0101  TROPONINI 0.13* 0.33*   Sepsis Labs: Recent Labs  Lab 03/24/18 2058 03/24/18 2125 03/25/18 0101  03/25/18 0456  03/26/18 0128 03/26/18 0255 03/26/18 0728 03/26/18 1355 04/22/2018 0330  PROCALCITON  --  0.45  --   --   --   --   --  2.62  --   --   --   WBC  --   --  13.7*   < >  --    < > 10.3  --  9.4 9.4 8.4  LATICACIDVEN 16.2*  --  16.7*  --  15.6*  --   --   --   --   --   --    < > = values in this interval not displayed.    Procedures/Operations     Max Fickle 04/21/2018, 3:21 PM

## 2018-04-25 DEATH — deceased

## 2018-07-04 IMAGING — DX DG CHEST 1V PORT
1 series · 1 of 1 positions shown · non-contrast
Comparison: Radiograph March 25, 2018.

CLINICAL DATA: Acute respiratory failure with hypoxemia.

EXAM:
PORTABLE CHEST 1 VIEW

[chest ap]
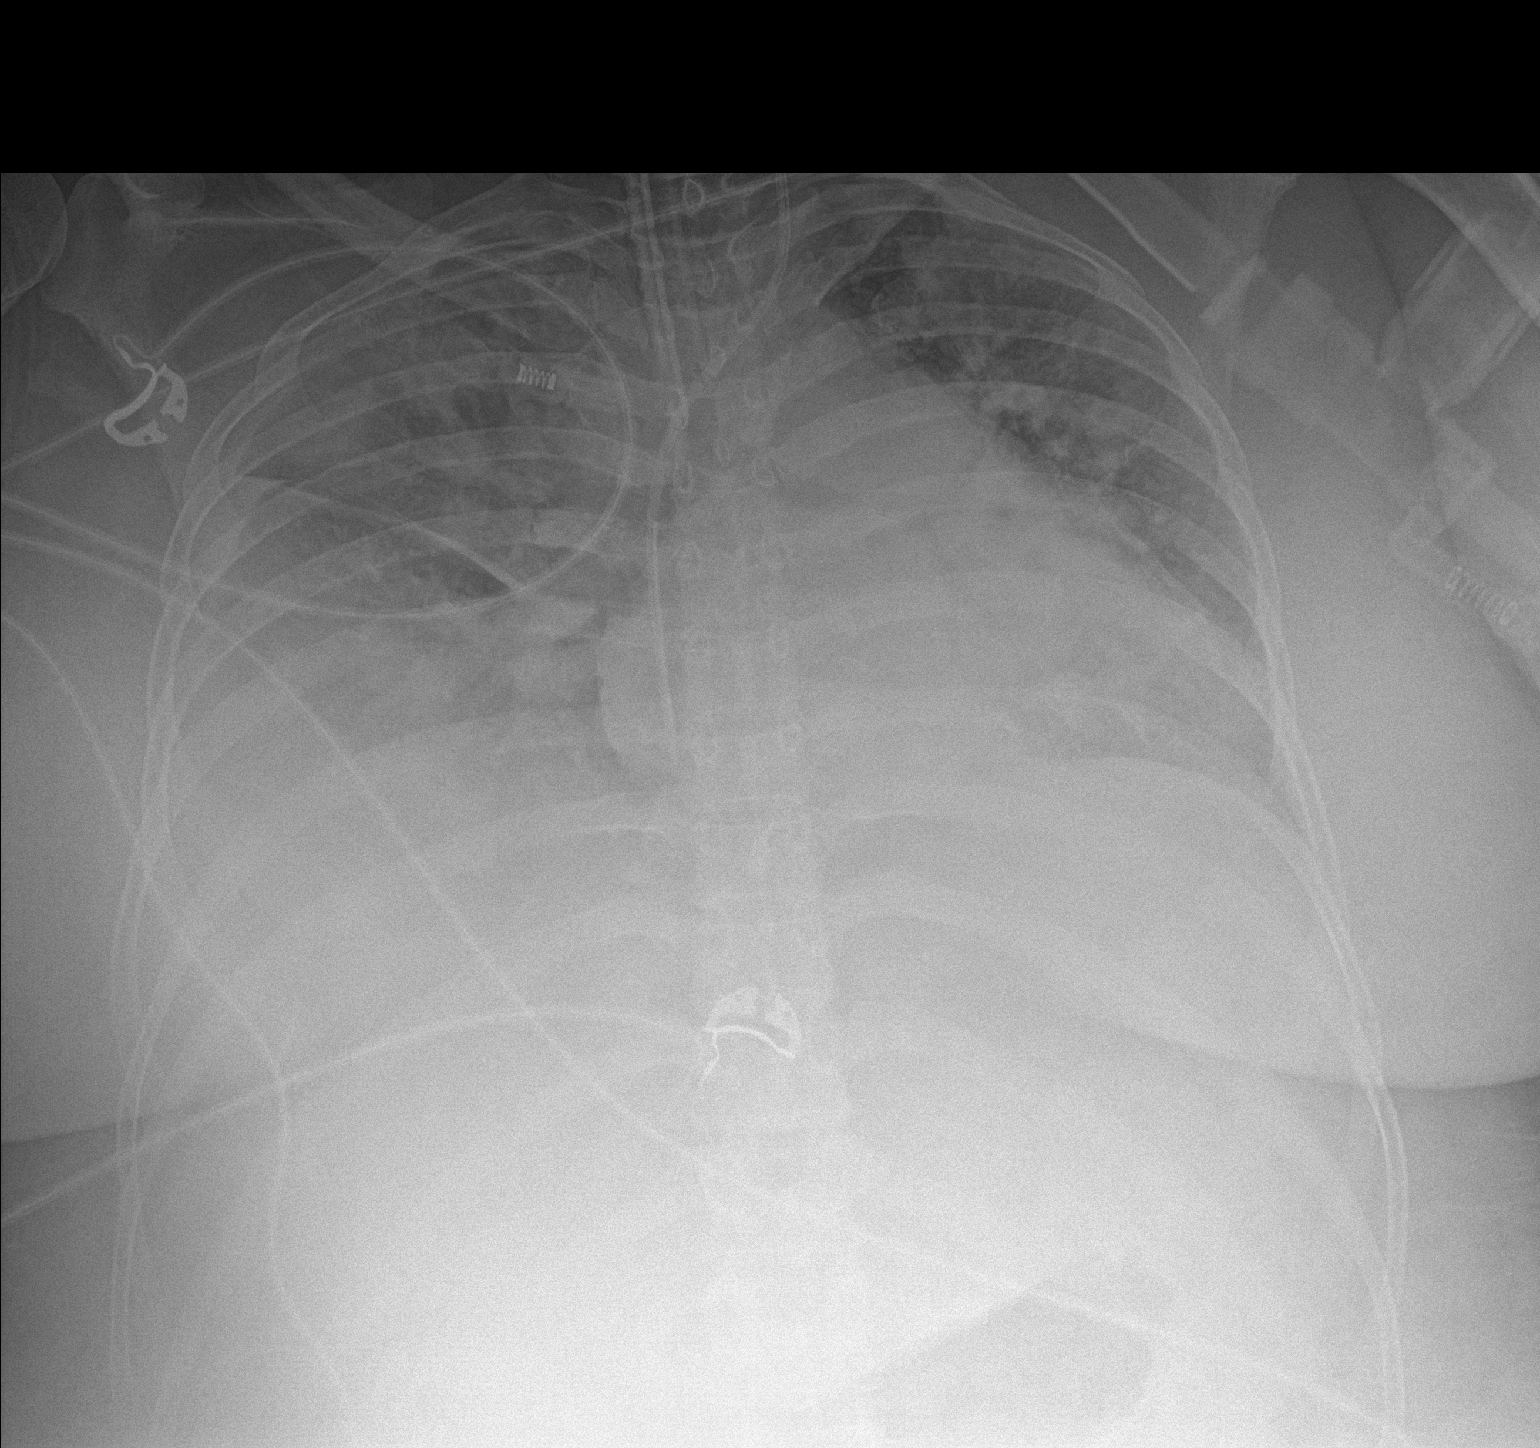

[1 of 1 positions shown; findings below may reference images not displayed]

FINDINGS: Stable cardiomegaly with central pulmonary vascular congestion is
noted. Endotracheal tube is in good position. Nasogastric tube has
been removed. Stable position of left internal jugular catheter with
tip in right atrium. No pneumothorax is noted. Mildly increased
bilateral lung opacities are noted concerning for worsening edema or
pneumonia. Mild right pleural effusion is noted. Bony thorax is
unremarkable.
IMPRESSION: Stable cardiomegaly with central pulmonary vascular congestion.
Endotracheal tube in grossly good position. Mildly increased
bilateral pulmonary edema or pneumonia is noted, right greater than
left. Mild right pleural effusion is noted.
